# Patient Record
Sex: Female | Born: 1950
Health system: Southern US, Community
[De-identification: ages and names within clinical notes are randomized; demographics above are authoritative.]

## PROBLEM LIST (undated history)

## (undated) ENCOUNTER — Emergency Department (HOSPITAL_COMMUNITY): Admission: EM | Payer: Medicaid Other | Source: Home / Self Care

## (undated) DIAGNOSIS — M1990 Unspecified osteoarthritis, unspecified site: Secondary | ICD-10-CM

## (undated) DIAGNOSIS — R413 Other amnesia: Secondary | ICD-10-CM

## (undated) DIAGNOSIS — K851 Biliary acute pancreatitis without necrosis or infection: Secondary | ICD-10-CM

## (undated) DIAGNOSIS — K8020 Calculus of gallbladder without cholecystitis without obstruction: Secondary | ICD-10-CM

## (undated) DIAGNOSIS — I10 Essential (primary) hypertension: Secondary | ICD-10-CM

## (undated) DIAGNOSIS — K219 Gastro-esophageal reflux disease without esophagitis: Secondary | ICD-10-CM

## (undated) DIAGNOSIS — G8929 Other chronic pain: Secondary | ICD-10-CM

## (undated) DIAGNOSIS — M25569 Pain in unspecified knee: Secondary | ICD-10-CM

## (undated) HISTORY — DX: Other amnesia: R41.3

## (undated) HISTORY — PX: EXTERNAL EAR SURGERY: SHX627

---

## 2010-06-30 ENCOUNTER — Inpatient Hospital Stay (INDEPENDENT_AMBULATORY_CARE_PROVIDER_SITE_OTHER)
Admission: RE | Admit: 2010-06-30 | Discharge: 2010-06-30 | Disposition: A | Discharge status: Home / Self Care | Payer: Medicaid Other | Source: Ambulatory Visit | Attending: Emergency Medicine | Admitting: Emergency Medicine

## 2010-06-30 DIAGNOSIS — I10 Essential (primary) hypertension: Secondary | ICD-10-CM

## 2010-11-20 ENCOUNTER — Inpatient Hospital Stay (INDEPENDENT_AMBULATORY_CARE_PROVIDER_SITE_OTHER)
Admission: RE | Admit: 2010-11-20 | Discharge: 2010-11-20 | Disposition: A | Discharge status: Home / Self Care | Payer: Medicaid Other | Source: Ambulatory Visit | Attending: Family Medicine | Admitting: Family Medicine

## 2010-11-20 DIAGNOSIS — I10 Essential (primary) hypertension: Secondary | ICD-10-CM

## 2010-11-20 DIAGNOSIS — R221 Localized swelling, mass and lump, neck: Secondary | ICD-10-CM

## 2011-07-19 ENCOUNTER — Emergency Department (HOSPITAL_COMMUNITY): Payer: Self-pay

## 2011-07-19 ENCOUNTER — Encounter (HOSPITAL_COMMUNITY): Payer: Self-pay | Admitting: Emergency Medicine

## 2011-07-19 ENCOUNTER — Emergency Department (HOSPITAL_COMMUNITY)
Admission: EM | Admit: 2011-07-19 | Discharge: 2011-07-19 | Disposition: A | Discharge status: Home / Self Care | Payer: Self-pay | Attending: Emergency Medicine | Admitting: Emergency Medicine

## 2011-07-19 DIAGNOSIS — S82402A Unspecified fracture of shaft of left fibula, initial encounter for closed fracture: Secondary | ICD-10-CM

## 2011-07-19 DIAGNOSIS — S8263XA Displaced fracture of lateral malleolus of unspecified fibula, initial encounter for closed fracture: Secondary | ICD-10-CM | POA: Insufficient documentation

## 2011-07-19 DIAGNOSIS — W19XXXA Unspecified fall, initial encounter: Secondary | ICD-10-CM | POA: Insufficient documentation

## 2011-07-19 DIAGNOSIS — Y92009 Unspecified place in unspecified non-institutional (private) residence as the place of occurrence of the external cause: Secondary | ICD-10-CM | POA: Insufficient documentation

## 2011-07-19 DIAGNOSIS — I10 Essential (primary) hypertension: Secondary | ICD-10-CM | POA: Insufficient documentation

## 2011-07-19 HISTORY — DX: Essential (primary) hypertension: I10

## 2011-07-19 MED ORDER — HYDROCODONE-ACETAMINOPHEN 5-325 MG PO TABS
1.0000 | ORAL_TABLET | Freq: Once | ORAL | Status: AC
  Administered 2011-07-19: 1 via ORAL
  Filled 2011-07-19: qty 1

## 2011-07-19 MED ORDER — HYDROCODONE-ACETAMINOPHEN 5-325 MG PO TABS: 1.0000 | ORAL_TABLET | ORAL | Status: AC | PRN

## 2011-07-19 NOTE — Progress Notes (Signed)
Orthopedic Tech Progress Note Patient Details:  Michelle Boyd 04/26/1950 [id number]  Ortho Devices Type of Ortho Device: Crutches;Post (short) splint;Stirrup splint Splint Material: Fiberglass Ortho Device/Splint Location: left leg Ortho Device/Splint Interventions: Application   Dorinda Stehr 07/19/2011, 11:17 PM

## 2011-07-19 NOTE — Discharge Instructions (Signed)
Your xrays show that you have a break to your fibula, the bone on the outside of your lower leg at the ankle. You have been placed in a splint for this. You will need to follow up with an orthopedic doctor to have further treatment and to get a cast. Please call the phone number listed above to make an appointment for next week. You have also been given a prescription for pain medication. Take as needed. If you have changes in the color of your skin, increased pain or swelling, numbness, or any other worrisome symptoms, return to the emergency department.  ???? xrays ?? ???? ???????????, ???? ??? ???? ?? ??? ?? ???? ?? ????? ?? ??? ?? ???? ???? ?? ?? ????. ?? ?? ?? ??? ?? ????? ??? ??? ??? ??. ?? ??? ?? ???? ?? ??? ?? ?? ?? ???? ???? ?? ??? ?? ?????????? ?????? ?? ??? ???? ???? ?? ???????? ????. ???? ?????? ?? ??? ?? ???????? ???? ?? ??? ??? ???????? ???? ???? ?? ??? ????. ?? ?? ???? ??? ?? ??? ?? ?????? ???? ??? ??. ????? ?? ??? ??? ?? ??. ?? ???? ????? ?? ??? ??? ???????? ???? ??, ???? ?? ????, ?????? ?? ????, ?? ???? ?? ???? ???????? ????? ?????? ??? ??, ????????? ????? ??? ?????.

## 2011-07-19 NOTE — ED Notes (Signed)
PT. REPORTS LEFT ANKLE PAIN / LEFT KNEE PAIN AND RIGHT LEG PAIN , SLIPPED AND FELL TODAY , NO LOC , AMBULATORY , DAUGHTER- IN - LAW TRANSLATING FOR PT. ( NEPALESE 0.

## 2011-07-19 NOTE — ED Provider Notes (Signed)
History     CSN: [id number]  Arrival date & time 07/19/11  2109   First MD Initiated Contact with Patient 07/19/11 2224      Chief Complaint  Patient presents with  . Ankle Pain    (Consider location/radiation/quality/duration/timing/severity/associated sxs/prior treatment) Patient is a 61 y.o. female presenting with ankle pain. The history is provided by the patient and a relative. Language Interpreter Used: daughter in law serving as interpreter.  Ankle Pain  The incident occurred 6 to 12 hours ago. The incident occurred at home. The injury mechanism was a fall. The pain is present in the left ankle and left knee. The quality of the pain is described as aching and throbbing. The pain is moderate. The pain has been constant since onset. Associated symptoms include inability to bear weight. Pertinent negatives include no numbness, no loss of motion, no muscle weakness, no loss of sensation and no tingling. She reports no foreign bodies present. The symptoms are aggravated by bearing weight and palpation. She has tried rest for the symptoms. The treatment provided no relief.   Pt twisted her ankle in a hole this afternoon. Had immediate pain to the L lat ankle after this. Unable to wt bear. Has noted swelling to the area. Slight pain to the L knee as well.   Past Medical History  Diagnosis Date  . Hypertension     History reviewed. No pertinent past surgical history.  No family history on file.  History  Substance Use Topics  . Smoking status: Never Smoker   . Smokeless tobacco: Not on file  . Alcohol Use: No    OB History    Grav Para Term Preterm Abortions TAB SAB Ect Mult Living                  Review of Systems  Constitutional: Negative.   Musculoskeletal: Positive for joint swelling and arthralgias.  Skin: Negative for wound.  Neurological: Negative for tingling and numbness.    Allergies  Review of patient's allergies indicates no known allergies.  Home  Medications   Current Outpatient Rx  Name Route Sig Dispense Refill  . CHLORTHALIDONE 25 MG PO TABS Oral Take 25 mg by mouth daily.    Marland Kitchen HYDRALAZINE HCL 25 MG PO TABS Oral Take 25 mg by mouth daily.    Marland Kitchen LISINOPRIL 10 MG PO TABS Oral Take 10 mg by mouth daily.    Marland Kitchen NAPROXEN 500 MG PO TABS Oral Take 500 mg by mouth 2 (two) times daily with a meal.      BP 148/67  Pulse 81  Temp(Src) 98.3 F (36.8 C) (Oral)  Resp 14  SpO2 99%  Physical Exam  Nursing note and vitals reviewed. Constitutional: She is oriented to person, place, and time. She appears well-developed and well-nourished. No distress.  HENT:  Head: Normocephalic and atraumatic.  Right Ear: External ear normal.  Left Ear: External ear normal.  Musculoskeletal:       Left knee: Normal.       Left ankle: She exhibits decreased range of motion, swelling and ecchymosis. She exhibits no deformity. tenderness. Lateral malleolus tenderness found. No medial malleolus and no head of 5th metatarsal tenderness found. Achilles tendon exhibits normal Thompson's test results.       ROM limited secondary to pain. Tender over lat malleolus with mild ecchymoses to same. L foot neurovascularly intact with sensory intact to light touch. Good DP and PT pulses. Cap refill <3.  Neurological: She is  alert and oriented to person, place, and time.  Skin: Skin is warm and dry. No rash noted. She is not diaphoretic.  Psychiatric: She has a normal mood and affect.    ED Course  Procedures (including critical care time)  Labs Reviewed - No data to display Dg Ankle Complete Left  07/19/2011  *RADIOLOGY REPORT*  Clinical Data: Larey Seat in hole, pain  LEFT ANKLE COMPLETE - 3+ VIEW  Comparison: None.  Findings: Transverse fracture lateral malleolus with slight inferior displacement.  Marked soft tissue swelling.  Ankle mortise intact.  IMPRESSION: Transverse lateral malleolar fracture.  Soft tissue swelling.  Original Report Authenticated By: Elsie Stain,  M.D.   Dg Knee Complete 4 Views Left  07/19/2011  *RADIOLOGY REPORT*  Clinical Data: Larey Seat in hole, pain  LEFT KNEE - COMPLETE 4+ VIEW  Comparison:  None.  Findings:  There is no evidence of fracture, dislocation, or joint effusion.  There is no evidence of arthropathy or other focal bone abnormality.  Soft tissues are unremarkable.  IMPRESSION: Negative.  Original Report Authenticated By: Elsie Stain, M.D.     1. Fibula fracture, left, closed, initial encounter       MDM  Pt with transverse lat malleolus fx by xray (I personally reviewed the films). She has point tenderness over this area. Will place in post splint, on crutches. Instructed to make f/u with ortho next week for further eval and tx. Reasons to return to ED discussed. Family verbalized understanding and agreeable with plan.       Grant Fontana, [place] 07/20/11 [phone]

## 2011-07-23 NOTE — ED Provider Notes (Signed)
Medical screening examination/treatment/procedure(s) were performed by non-physician practitioner and as supervising physician I was immediately available for consultation/collaboration.  Belen Pesch L Zaviyar Rahal, MD 07/23/11 1519 

## 2012-04-04 ENCOUNTER — Encounter (HOSPITAL_COMMUNITY): Payer: Self-pay

## 2012-04-04 ENCOUNTER — Emergency Department (INDEPENDENT_AMBULATORY_CARE_PROVIDER_SITE_OTHER)
Admission: EM | Admit: 2012-04-04 | Discharge: 2012-04-04 | Disposition: A | Discharge status: Home / Self Care | Payer: Self-pay | Source: Home / Self Care

## 2012-04-04 DIAGNOSIS — I10 Essential (primary) hypertension: Secondary | ICD-10-CM

## 2012-04-04 LAB — COMPREHENSIVE METABOLIC PANEL
ALT: 15 U/L (ref 0–35)
Albumin: 3.8 g/dL (ref 3.5–5.2)
Alkaline Phosphatase: 115 U/L (ref 39–117)
BUN: 8 mg/dL (ref 6–23)
Chloride: 104 mEq/L (ref 96–112)
GFR calc Af Amer: >90 mL/min (ref >90)
Glucose, Bld: 113 mg/dL — ABNORMAL HIGH (ref 70–99)
Potassium: 3 mEq/L — ABNORMAL LOW (ref 3.5–5.1)
Sodium: 140 mEq/L (ref 135–145)
Total Bilirubin: 0.5 mg/dL (ref 0.3–1.2)
Total Protein: 8.1 g/dL (ref 6.0–8.3)

## 2012-04-04 LAB — LIPID PANEL
Cholesterol: 190 mg/dL (ref 0–200)
LDL Cholesterol: 105 mg/dL — ABNORMAL HIGH (ref 0–99)
Total CHOL/HDL Ratio: 3.8 RATIO
Triglycerides: 176 mg/dL — ABNORMAL HIGH (ref <150)
VLDL: 35 mg/dL (ref 0–40)

## 2012-04-04 LAB — CBC
HCT: 36.4 % (ref 36.0–46.0)
Hemoglobin: 11.5 g/dL — ABNORMAL LOW (ref 12.0–15.0)
RBC: 5.21 MIL/uL — ABNORMAL HIGH (ref 3.87–5.11)
WBC: 6.2 10*3/uL (ref 4.0–10.5)

## 2012-04-04 MED ORDER — ASPIRIN 81 MG PO TABS: 81.0000 mg | ORAL_TABLET | Freq: Every day | ORAL | Status: DC

## 2012-04-04 MED ORDER — LISINOPRIL-HYDROCHLOROTHIAZIDE 20-25 MG PO TABS: 1.0000 | ORAL_TABLET | Freq: Every day | ORAL | Status: DC

## 2012-04-04 NOTE — ED Notes (Signed)
Complain of cough congestion and a lot of sinus pressure past 3 days

## 2012-04-04 NOTE — ED Provider Notes (Signed)
History     CSN: [id number]  Arrival date & time 04/04/12  1600   First MD Initiated Contact with Patient 04/04/12 1614      Chief Complaint  Patient presents with  . URI    (Consider location/radiation/quality/duration/timing/severity/associated sxs/prior treatment) HPI  Patient is here today for regular check up. She has never had any medical care in the past. Her BP today is very high.  She needs to get mammogram, Colonoscopy, pap smear and vaccinations.     Past Medical History  Diagnosis Date  . Hypertension     History reviewed. No pertinent past surgical history.  No family history on file.  History  Substance Use Topics  . Smoking status: Never Smoker   . Smokeless tobacco: Not on file  . Alcohol Use: No    OB History   Grav Para Term Preterm Abortions TAB SAB Ect Mult Living                  Review of Systems  Allergies  Review of patient's allergies indicates no known allergies.  Home Medications   Current Outpatient Rx  Name  Route  Sig  Dispense  Refill  . chlorthalidone (HYGROTON) 25 MG tablet   Oral   Take 25 mg by mouth daily.         . hydrALAZINE (APRESOLINE) 25 MG tablet   Oral   Take 25 mg by mouth daily.         Marland Kitchen lisinopril (PRINIVIL,ZESTRIL) 10 MG tablet   Oral   Take 10 mg by mouth daily.         . naproxen (NAPROSYN) 500 MG tablet   Oral   Take 500 mg by mouth 2 (two) times daily with a meal.           BP 214/97  Pulse 97  Temp(Src) 97.7 F (36.5 C) (Oral)  SpO2 100%  Physical Exam Physical Exam: General: Vital signs reviewed and noted. Well-developed, well-nourished, in no acute distress; alert, appropriate and cooperative throughout examination.  Head: Normocephalic, atraumatic.  Eyes: PERRL, EOMI, No signs of anemia or jaundince.  Nose: Mucous membranes moist, not inflammed, nonerythematous.  Throat: Oropharynx nonerythematous, no exudate appreciated.   Neck: No deformities, masses, or tenderness  noted.Supple, No carotid Bruits, no JVD.  Lungs:  Normal respiratory effort. Clear to auscultation BL without crackles or wheezes.  Heart: RRR. S1 and S2 normal without gallop, murmur, or rubs.  Abdomen:  BS normoactive. Soft, Nondistended, non-tender.  No masses or organomegaly.  Extremities: No pretibial edema.  Neurologic: A&O X3, CN II - XII are grossly intact. Motor strength is 5/5 in the all 4 extremities, Sensations intact to light touch, Cerebellar signs negative.  Skin: No visible rashes, scars.     ED Course  Procedures (including critical care time)  Labs Reviewed - No data to display No results found.   No diagnosis found.    MDM  1. Start her on HCTZ-lisinopril combination pill today.  Follow up in 10 days. 2. Check lipid panel, TSH, HBa1c, CMP and CBC today. 3. Repeat BMP in 10 days to review kidney function and K.        Lars Mage, MD 04/04/12 1626

## 2012-04-05 LAB — TSH: TSH: 1.085 u[IU]/mL (ref 0.350–4.500)

## 2012-04-14 ENCOUNTER — Telehealth (HOSPITAL_COMMUNITY): Payer: Self-pay

## 2012-05-13 ENCOUNTER — Emergency Department (HOSPITAL_COMMUNITY)
Admission: EM | Admit: 2012-05-13 | Discharge: 2012-05-13 | Disposition: A | Discharge status: Home / Self Care | Payer: No Typology Code available for payment source | Source: Home / Self Care | Attending: Family Medicine | Admitting: Family Medicine

## 2012-05-13 ENCOUNTER — Encounter (HOSPITAL_COMMUNITY): Payer: Self-pay | Admitting: Emergency Medicine

## 2012-05-13 DIAGNOSIS — J069 Acute upper respiratory infection, unspecified: Secondary | ICD-10-CM

## 2012-05-13 DIAGNOSIS — I10 Essential (primary) hypertension: Secondary | ICD-10-CM

## 2012-05-13 MED ORDER — DEXTROMETHORPHAN POLISTIREX 30 MG/5ML PO LQCR: 60.0000 mg | Freq: Two times a day (BID) | ORAL | Status: DC

## 2012-05-13 MED ORDER — LISINOPRIL-HYDROCHLOROTHIAZIDE 20-25 MG PO TABS: 1.0000 | ORAL_TABLET | Freq: Every day | ORAL | Status: DC

## 2012-05-13 NOTE — ED Provider Notes (Signed)
History     CSN: [id number]  Arrival date & time 05/13/12  4098   First MD Initiated Contact with Patient 05/13/12 1821      Chief Complaint  Patient presents with  . Medication Refill    one week with out meds.    (Consider location/radiation/quality/duration/timing/severity/associated sxs/prior treatment) Patient is a 62 y.o. female presenting with hypertension. The history is provided by the patient.  Hypertension This is a chronic problem. The current episode started more than 1 week ago (out of med for 1 week , here for refill.). The problem has not changed since onset.Pertinent negatives include no chest pain.    Past Medical History  Diagnosis Date  . Hypertension     History reviewed. No pertinent past surgical history.  History reviewed. No pertinent family history.  History  Substance Use Topics  . Smoking status: Never Smoker   . Smokeless tobacco: Not on file  . Alcohol Use: No    OB History   Grav Para Term Preterm Abortions TAB SAB Ect Mult Living                  Review of Systems  Constitutional: Negative.   Respiratory: Negative.   Cardiovascular: Negative for chest pain.    Allergies  Review of patient's allergies indicates no known allergies.  Home Medications   Current Outpatient Rx  Name  Route  Sig  Dispense  Refill  . aspirin 81 MG tablet   Oral   Take 1 tablet (81 mg total) by mouth daily.   30 tablet      . dextromethorphan (DELSYM) 30 MG/5ML liquid   Oral   Take 10 mLs (60 mg total) by mouth 2 (two) times daily. Prn cough   89 mL   0   . lisinopril-hydrochlorothiazide (PRINZIDE,ZESTORETIC) 20-25 MG per tablet   Oral   Take 1 tablet by mouth daily.   30 tablet   0   . lisinopril-hydrochlorothiazide (PRINZIDE,ZESTORETIC) 20-25 MG per tablet   Oral   Take 1 tablet by mouth daily.   30 tablet   1     BP 178/95  Pulse 74  Temp(Src) 99.1 F (37.3 C) (Oral)  Resp 20  SpO2 98%  Physical Exam  Nursing note and  vitals reviewed. Constitutional: She is oriented to person, place, and time. She appears well-developed and well-nourished.  HENT:  Head: Normocephalic.  Mouth/Throat: Oropharynx is clear and moist.  Eyes: Conjunctivae are normal. Pupils are equal, round, and reactive to light.  Neck: Normal range of motion. Neck supple.  Cardiovascular: Regular rhythm and normal heart sounds.   Pulmonary/Chest: Effort normal and breath sounds normal.  Musculoskeletal: She exhibits no edema.  Lymphadenopathy:    She has no cervical adenopathy.  Neurological: She is alert and oriented to person, place, and time.  Skin: Skin is warm and dry.    ED Course  Procedures (including critical care time)  Labs Reviewed - No data to display No results found.   1. Hypertension, benign essential, goal below 140/90   2. URI (upper respiratory infection)       MDM          Linna Hoff, MD 05/13/12 [phone]

## 2012-05-13 NOTE — ED Notes (Signed)
Reports: needs refill on BP medication has been with out for 1 wk  And having a nonproductive cough with some congestion. Denies fever and any other symptoms.

## 2012-05-26 ENCOUNTER — Encounter (HOSPITAL_COMMUNITY): Payer: Self-pay

## 2012-05-26 ENCOUNTER — Emergency Department (HOSPITAL_COMMUNITY)
Admission: EM | Admit: 2012-05-26 | Discharge: 2012-05-26 | Disposition: A | Discharge status: Home / Self Care | Payer: No Typology Code available for payment source | Source: Home / Self Care | Attending: Family Medicine | Admitting: Family Medicine

## 2012-05-26 DIAGNOSIS — E876 Hypokalemia: Secondary | ICD-10-CM | POA: Diagnosis present

## 2012-05-26 DIAGNOSIS — I10 Essential (primary) hypertension: Secondary | ICD-10-CM

## 2012-05-26 LAB — POCT I-STAT, CHEM 8
Calcium, Ion: 1.2 mmol/L (ref 1.13–1.30)
Chloride: 93 mEq/L — ABNORMAL LOW (ref 96–112)
Glucose, Bld: 132 mg/dL — ABNORMAL HIGH (ref 70–99)
HCT: 37 % (ref 36.0–46.0)
Hemoglobin: 12.6 g/dL (ref 12.0–15.0)

## 2012-05-26 MED ORDER — POTASSIUM CHLORIDE ER 10 MEQ PO TBCR: 10.0000 meq | EXTENDED_RELEASE_TABLET | Freq: Two times a day (BID) | ORAL | Status: DC

## 2012-05-26 MED ORDER — LISINOPRIL-HYDROCHLOROTHIAZIDE 20-12.5 MG PO TABS: 1.0000 | ORAL_TABLET | Freq: Every day | ORAL | Status: DC

## 2012-05-26 NOTE — ED Provider Notes (Signed)
History     CSN: [id number]  Arrival date & time 05/26/12  1800   First MD Initiated Contact with Patient 05/26/12 1800      Chief Complaint  Patient presents with  . Hypertension    HPI Pt says she is tolerating meds well. NO complaints except occasional cough and cramping in hands.  No CP, No SOB.    Past Medical History  Diagnosis Date  . Hypertension     History reviewed. No pertinent past surgical history.  No family history on file.  History  Substance Use Topics  . Smoking status: Never Smoker   . Smokeless tobacco: Not on file  . Alcohol Use: No    OB History   Grav Para Term Preterm Abortions TAB SAB Ect Mult Living                  Review of Systems Constitutional: Negative.  HENT: Negative.  Respiratory: Negative.  Cardiovascular: Negative.  Gastrointestinal: Negative.  Endocrine: Negative.  Genitourinary: Negative.  Musculoskeletal: Negative.  Skin: Negative.  Allergic/Immunologic: Negative.  Neurological: Negative.  Hematological: Negative.  Psychiatric/Behavioral: Negative.  All other systems reviewed and are negative   Allergies  Review of patient's allergies indicates no known allergies.  Home Medications   Current Outpatient Rx  Name  Route  Sig  Dispense  Refill  . aspirin 81 MG tablet   Oral   Take 1 tablet (81 mg total) by mouth daily.   30 tablet      . dextromethorphan (DELSYM) 30 MG/5ML liquid   Oral   Take 10 mLs (60 mg total) by mouth 2 (two) times daily. Prn cough   89 mL   0   . lisinopril-hydrochlorothiazide (PRINZIDE,ZESTORETIC) 20-25 MG per tablet   Oral   Take 1 tablet by mouth daily.   30 tablet   0   . lisinopril-hydrochlorothiazide (PRINZIDE,ZESTORETIC) 20-25 MG per tablet   Oral   Take 1 tablet by mouth daily.   30 tablet   1     BP 121/74  Pulse 83  Temp(Src) 98.1 F (36.7 C) (Oral)  Resp 16  SpO2 98%  Physical Exam Nursing note and vitals reviewed.  Constitutional: She is oriented to  person, place, and time. She appears well-developed and well-nourished. No distress.  HENT:  Head: Normocephalic and atraumatic.  Eyes: Conjunctivae and EOM are normal. Pupils are equal, round, and reactive to light.  Neck: Normal range of motion. Neck supple. No JVD present. No tracheal deviation present. No thyromegaly present.  Cardiovascular: Normal rate, regular rhythm and normal heart sounds.  Pulmonary/Chest: Effort normal and breath sounds normal. No respiratory distress. She has no wheezes.  Abdominal: Soft. Bowel sounds are normal.  Musculoskeletal: arthritic changes noted.  Normal range of motion. She exhibits no edema and no tenderness.  Lymphadenopathy:  She has no cervical adenopathy.  Neurological: She is alert and oriented to person, place, and time. She has normal reflexes.  Skin: Skin is warm and dry.  Psychiatric: She has a normal mood and affect. Her behavior is normal. Judgment and thought content normal.   ED Course  Procedures (including critical care time)  Labs Reviewed - No data to display No results found.   No diagnosis found.  MDM  IMPRESSION  Hypertension, controlled   Hypokalemia   Hyperglycemia / prediabetes   RECOMMENDATIONS / PLAN Discussed prediabetes today and advised low carb and low concentrated sweets, blood pressure is well controlled, Check istat today Low potassium noted  KCl 10 meq po bid Change zestoretic to 20/12.5 po daily   FOLLOW UP 3 months   The patient was given clear instructions to go to ER or return to medical center if symptoms don't improve, worsen or new problems develop.  The patient verbalized understanding.  The patient was told to call to get lab results if they haven't heard anything in the next week.            Cleora Fleet, MD 05/26/12 1905

## 2012-05-26 NOTE — ED Notes (Signed)
Patient has a history of HTN Needs medication refill

## 2012-07-17 ENCOUNTER — Ambulatory Visit: Payer: No Typology Code available for payment source | Attending: Family Medicine | Admitting: Internal Medicine

## 2012-07-17 VITALS — BP 121/74 | HR 80 | Temp 98.0°F | Resp 15 | Wt 135.4 lb

## 2012-07-17 DIAGNOSIS — Z760 Encounter for issue of repeat prescription: Secondary | ICD-10-CM | POA: Insufficient documentation

## 2012-07-17 DIAGNOSIS — Z87891 Personal history of nicotine dependence: Secondary | ICD-10-CM | POA: Insufficient documentation

## 2012-07-17 DIAGNOSIS — I10 Essential (primary) hypertension: Secondary | ICD-10-CM

## 2012-07-17 DIAGNOSIS — Z79899 Other long term (current) drug therapy: Secondary | ICD-10-CM | POA: Insufficient documentation

## 2012-07-17 DIAGNOSIS — Z7982 Long term (current) use of aspirin: Secondary | ICD-10-CM | POA: Insufficient documentation

## 2012-07-17 DIAGNOSIS — R059 Cough, unspecified: Secondary | ICD-10-CM | POA: Insufficient documentation

## 2012-07-17 DIAGNOSIS — R05 Cough: Secondary | ICD-10-CM

## 2012-07-17 DIAGNOSIS — E876 Hypokalemia: Secondary | ICD-10-CM | POA: Insufficient documentation

## 2012-07-17 MED ORDER — FAMOTIDINE 20 MG PO TABS: 20.0000 mg | ORAL_TABLET | Freq: Two times a day (BID) | ORAL | Status: DC

## 2012-07-17 MED ORDER — LISINOPRIL-HYDROCHLOROTHIAZIDE 20-12.5 MG PO TABS: 1.0000 | ORAL_TABLET | Freq: Every day | ORAL | Status: DC

## 2012-07-17 MED ORDER — LORATADINE 10 MG PO TABS: 10.0000 mg | ORAL_TABLET | Freq: Every day | ORAL | Status: DC

## 2012-07-17 NOTE — Progress Notes (Signed)
Patient ID: Michelle Boyd, female   DOB: [birth year], 62 y.o.   MRN: [id number] Patient Demographics  Michelle Boyd, is a 62 y.o. female  WUJ:[id number]  [id number]  DOB - [birth year]  Chief Complaint  Patient presents with  . Medication Refill        Subjective:   [name] today is here for a follow up visit. She needs a refill on her antihypertensive medications, her only complaint currently is a dry cough that she's been having for the past 1-2 months. She also claims that she has occasional runny nose and also itchy sensation in her throat. She does not have fever, cough is nonproductive, there is no associated shortness of breath. She is a former smoker and quit more than 20 years ago. She has a 10 year pack history of smoking he did  Patient has No headache, No chest pain, No abdominal pain - No Nausea, No new weakness tingling or numbness, No Cough - SOB.  Objective:    Filed Vitals:   07/17/12 1739  BP: 121/74  Pulse: 80  Temp: 98 F (36.7 C)  Resp: 15  Weight: 135 lb 6.4 oz (61.417 kg)  SpO2: 99%     ALLERGIES:  No Known Allergies  PAST MEDICAL HISTORY: Past Medical History  Diagnosis Date  . Hypertension     MEDICATIONS AT HOME: Prior to Admission medications   Medication Sig Start Date End Date Taking? Authorizing Provider  aspirin 81 MG tablet Take 1 tablet (81 mg total) by mouth daily. 04/04/12   Lars Mage, MD  famotidine (PEPCID) 20 MG tablet Take 1 tablet (20 mg total) by mouth 2 (two) times daily. 07/17/12   Tyrianna Lightle Levora Dredge, MD  lisinopril-hydrochlorothiazide (ZESTORETIC) 20-12.5 MG per tablet Take 1 tablet by mouth daily. 07/17/12   Lovelle Deitrick Levora Dredge, MD  loratadine (CLARITIN) 10 MG tablet Take 1 tablet (10 mg total) by mouth daily. 07/17/12   Chaye Misch Levora Dredge, MD  potassium chloride (K-DUR) 10 MEQ tablet Take 1 tablet (10 mEq total) by mouth 2 (two) times daily. 05/26/12   [place] Cyndie Mull, MD     Exam  General appearance :Awake, alert, not in any  distress. Speech Clear. Not toxic Looking HEENT: Atraumatic and Normocephalic, pupils equally reactive to light and accomodation Neck: supple, no JVD. No cervical lymphadenopathy.  Chest:Good air entry bilaterally, no added sounds  CVS: S1 S2 regular, no murmurs.  Abdomen: Bowel sounds present, Non tender and not distended with no gaurding, rigidity or rebound. Extremities: B/L Lower Ext shows no edema, both legs are warm to touch Neurology: Awake alert, and oriented X 3, CN II-XII intact, Non focal Skin:No Rash Wounds:N/A    Data Review   CBC No results found for this basename: WBC, HGB, HCT, PLT, MCV, MCH, MCHC, RDW, NEUTRABS, LYMPHSABS, MONOABS, EOSABS, BASOSABS, BANDABS, BANDSABD,  in the last 168 hours  Chemistries   No results found for this basename: NA, K, CL, CO2, GLUCOSE, BUN, CREATININE, GFRCGP, CALCIUM, MG, AST, ALT, ALKPHOS, BILITOT,  in the last 168 hours ------------------------------------------------------------------------------------------------------------------ No results found for this basename: HGBA1C,  in the last 72 hours ------------------------------------------------------------------------------------------------------------------ No results found for this basename: CHOL, HDL, LDLCALC, TRIG, CHOLHDL, LDLDIRECT,  in the last 72 hours ------------------------------------------------------------------------------------------------------------------ No results found for this basename: TSH, T4TOTAL, FREET3, T3FREE, THYROIDAB,  in the last 72 hours ------------------------------------------------------------------------------------------------------------------ No results found for this basename: VITAMINB12, FOLATE, FERRITIN, TIBC, IRON, RETICCTPCT,  in the last 72 hours  Coagulation profile  No results found  for this basename: INR, PROTIME,  in the last 168 hours    Assessment & Plan   Hypertension - Controlled, continue with lisinopril and  HCTZ  Hypokalemia - Check BMET at next - Continue with  KCl supplementation  Cough -? Secondary to seasonal allergies - Trial of Pepcid and Claritin for a month - Check x-ray- prior to next visit) from [place]-family claims PPD negative during immigration process 2011)  Follow up in one month-please followup on chemistries and chest x-ray on next visit

## 2012-07-17 NOTE — Progress Notes (Signed)
Patient needs refill on her blood pressure medication

## 2012-08-18 ENCOUNTER — Ambulatory Visit: Payer: No Typology Code available for payment source

## 2012-09-22 ENCOUNTER — Emergency Department (HOSPITAL_COMMUNITY)
Admission: EM | Admit: 2012-09-22 | Discharge: 2012-09-23 | Disposition: A | Discharge status: Home / Self Care | Payer: No Typology Code available for payment source | Attending: Emergency Medicine | Admitting: Emergency Medicine

## 2012-09-22 ENCOUNTER — Emergency Department (HOSPITAL_COMMUNITY): Payer: No Typology Code available for payment source

## 2012-09-22 ENCOUNTER — Encounter (HOSPITAL_COMMUNITY): Payer: Self-pay | Admitting: *Deleted

## 2012-09-22 DIAGNOSIS — R5381 Other malaise: Secondary | ICD-10-CM | POA: Insufficient documentation

## 2012-09-22 DIAGNOSIS — M542 Cervicalgia: Secondary | ICD-10-CM | POA: Insufficient documentation

## 2012-09-22 DIAGNOSIS — J020 Streptococcal pharyngitis: Secondary | ICD-10-CM | POA: Insufficient documentation

## 2012-09-22 DIAGNOSIS — IMO0001 Reserved for inherently not codable concepts without codable children: Secondary | ICD-10-CM | POA: Insufficient documentation

## 2012-09-22 DIAGNOSIS — R059 Cough, unspecified: Secondary | ICD-10-CM | POA: Insufficient documentation

## 2012-09-22 DIAGNOSIS — R000 Tachycardia, unspecified: Secondary | ICD-10-CM | POA: Insufficient documentation

## 2012-09-22 DIAGNOSIS — I10 Essential (primary) hypertension: Secondary | ICD-10-CM | POA: Insufficient documentation

## 2012-09-22 DIAGNOSIS — R509 Fever, unspecified: Secondary | ICD-10-CM | POA: Insufficient documentation

## 2012-09-22 DIAGNOSIS — R599 Enlarged lymph nodes, unspecified: Secondary | ICD-10-CM | POA: Insufficient documentation

## 2012-09-22 DIAGNOSIS — R05 Cough: Secondary | ICD-10-CM | POA: Insufficient documentation

## 2012-09-22 LAB — URINALYSIS, ROUTINE W REFLEX MICROSCOPIC
Nitrite: NEGATIVE
Specific Gravity, Urine: 1.005 (ref 1.005–1.030)
pH: 7 (ref 5.0–8.0)

## 2012-09-22 LAB — COMPREHENSIVE METABOLIC PANEL
ALT: 16 U/L (ref 0–35)
AST: 24 U/L (ref 0–37)
Albumin: 3.5 g/dL (ref 3.5–5.2)
Alkaline Phosphatase: 88 U/L (ref 39–117)
BUN: 9 mg/dL (ref 6–23)
Chloride: 103 mEq/L (ref 96–112)
Potassium: 3.4 mEq/L — ABNORMAL LOW (ref 3.5–5.1)
Sodium: 135 mEq/L (ref 135–145)
Total Bilirubin: 0.9 mg/dL (ref 0.3–1.2)
Total Protein: 7.8 g/dL (ref 6.0–8.3)

## 2012-09-22 LAB — CBC WITH DIFFERENTIAL/PLATELET
Basophils Absolute: 0 10*3/uL (ref 0.0–0.1)
Basophils Relative: 0 % (ref 0–1)
Eosinophils Absolute: 0 10*3/uL (ref 0.0–0.7)
MCH: 24.8 pg — ABNORMAL LOW (ref 26.0–34.0)
MCHC: 32.9 g/dL (ref 30.0–36.0)
Neutro Abs: 15.6 10*3/uL — ABNORMAL HIGH (ref 1.7–7.7)
Neutrophils Relative %: 88 % — ABNORMAL HIGH (ref 43–77)
Platelets: 195 10*3/uL (ref 150–400)
RDW: 14.9 % (ref 11.5–15.5)

## 2012-09-22 LAB — RAPID STREP SCREEN (MED CTR MEBANE ONLY): Streptococcus, Group A Screen (Direct): POSITIVE — AB

## 2012-09-22 LAB — CG4 I-STAT (LACTIC ACID): Lactic Acid, Venous: 1.61 mmol/L (ref 0.5–2.2)

## 2012-09-22 LAB — URINE MICROSCOPIC-ADD ON

## 2012-09-22 MED ORDER — ACETAMINOPHEN 325 MG PO TABS
650.0000 mg | ORAL_TABLET | Freq: Four times a day (QID) | ORAL | Status: DC | PRN
  Administered 2012-09-22: 650 mg via ORAL
  Filled 2012-09-22: qty 2

## 2012-09-22 MED ORDER — SODIUM CHLORIDE 0.9 % IV BOLUS (SEPSIS)
1000.0000 mL | Freq: Once | INTRAVENOUS | Status: AC
  Administered 2012-09-22: 1000 mL via INTRAVENOUS

## 2012-09-22 MED ORDER — IBUPROFEN 600 MG PO TABS: 600.0000 mg | ORAL_TABLET | Freq: Four times a day (QID) | ORAL | Status: DC | PRN

## 2012-09-22 MED ORDER — PENICILLIN V POTASSIUM 500 MG PO TABS: 500.0000 mg | ORAL_TABLET | Freq: Four times a day (QID) | ORAL | Status: AC

## 2012-09-22 NOTE — ED Notes (Signed)
Patient transported to X-ray 

## 2012-09-22 NOTE — ED Notes (Signed)
Pt went to restroom and was aware she needed to give a urine sample and states she missed the cup and was unable to give sample.

## 2012-09-22 NOTE — ED Notes (Signed)
PT c/o cough and sore throat fever and body aches x 2 weeks.  Temp of 103.2 presently.

## 2012-09-23 NOTE — ED Notes (Signed)
Pt comfortable with d/c and f/u instructions. [place] interpreter used for discharge instruction. Prescriptions x2

## 2012-09-24 NOTE — ED Provider Notes (Signed)
CSN: [id number]     Arrival date & time 09/22/12  1833 History     First MD Initiated Contact with Patient 09/22/12 2005     Chief Complaint  Patient presents with  . Sore Throat  . Cough  . Fever   (Consider location/radiation/quality/duration/timing/severity/associated sxs/prior Treatment) HPI Pt does not speak english and daughter is in room to interpret. Pt with fever, chills, fatigue and myalgias for > 1 week. No recent travel, sick contact. +mild non-productive cough. +sore throat. Normal speech and breathing. No lower ext swelling or pain. No abd pain, N/V/D. No urinary symptoms.  Past Medical History  Diagnosis Date  . Hypertension    Past Surgical History  Procedure Laterality Date  . External ear surgery     No family history on file. History  Substance Use Topics  . Smoking status: Never Smoker   . Smokeless tobacco: Not on file  . Alcohol Use: No   OB History   Grav Para Term Preterm Abortions TAB SAB Ect Mult Living                 Review of Systems  Constitutional: Positive for fever, chills and fatigue.  HENT: Positive for sore throat and neck pain. Negative for ear pain, congestion, facial swelling, rhinorrhea, trouble swallowing, neck stiffness, voice change and sinus pressure.   Eyes: Negative for visual disturbance.  Respiratory: Positive for cough. Negative for chest tightness and shortness of breath.   Cardiovascular: Negative for chest pain, palpitations and leg swelling.  Gastrointestinal: Negative for nausea, vomiting, abdominal pain and diarrhea.  Genitourinary: Negative for dysuria, frequency and flank pain.  Musculoskeletal: Positive for myalgias. Negative for back pain and arthralgias.  Skin: Negative for rash and wound.  Neurological: Negative for dizziness, weakness, light-headedness, numbness and headaches.  All other systems reviewed and are negative.    Allergies  Review of patient's allergies indicates no known allergies.  Home  Medications   Current Outpatient Rx  Name  Route  Sig  Dispense  Refill  . aspirin 81 MG tablet   Oral   Take 1 tablet (81 mg total) by mouth daily.   30 tablet      . famotidine (PEPCID) 20 MG tablet   Oral   Take 1 tablet (20 mg total) by mouth 2 (two) times daily.   60 tablet   0   . lisinopril-hydrochlorothiazide (ZESTORETIC) 20-12.5 MG per tablet   Oral   Take 1 tablet by mouth daily.   30 tablet   4   . loratadine (CLARITIN) 10 MG tablet   Oral   Take 1 tablet (10 mg total) by mouth daily.   30 tablet   0   . potassium chloride (K-DUR) 10 MEQ tablet   Oral   Take 1 tablet (10 mEq total) by mouth 2 (two) times daily.   30 tablet   0   . ibuprofen (ADVIL,MOTRIN) 600 MG tablet   Oral   Take 1 tablet (600 mg total) by mouth every 6 (six) hours as needed for pain.   30 tablet   0   . penicillin v potassium (VEETID) 500 MG tablet   Oral   Take 1 tablet (500 mg total) by mouth 4 (four) times daily.   40 tablet   0    BP 163/85  Pulse 90  Temp(Src) 98.8 F (37.1 C) (Oral)  Resp 18  SpO2 98% Physical Exam  Nursing note and vitals reviewed. Constitutional: She is oriented to  person, place, and time. She appears well-developed and well-nourished. No distress.  HENT:  Head: Normocephalic and atraumatic.  Erythematous bl tonsillar enlargement. No def exudates  Eyes: EOM are normal. Pupils are equal, round, and reactive to light.  Neck: Normal range of motion. Neck supple.  No meningismus  Cardiovascular: Regular rhythm.  Exam reveals no gallop and no friction rub.   No murmur heard. tachycardia  Pulmonary/Chest: Effort normal and breath sounds normal. No respiratory distress. She has no wheezes. She has no rales. She exhibits no tenderness.  Abdominal: Soft. Bowel sounds are normal. She exhibits no distension and no mass. There is no tenderness. There is no rebound and no guarding.  Musculoskeletal: Normal range of motion. She exhibits no edema and no  tenderness.  No CVAT  Lymphadenopathy:    She has cervical adenopathy.  Neurological: She is alert and oriented to person, place, and time.  5/5 motor in all ext, sensation intact  Skin: Skin is warm and dry. No rash noted. No erythema. No pallor.  Psychiatric: She has a normal mood and affect. Her behavior is normal.    ED Course   Procedures (including critical care time)  Labs Reviewed  RAPID STREP SCREEN - Abnormal; Notable for the following:    Streptococcus, Group A Screen (Direct) POSITIVE (*)    All other components within normal limits  CBC WITH DIFFERENTIAL - Abnormal; Notable for the following:    WBC 17.7 (*)    Hemoglobin 11.4 (*)    HCT 34.7 (*)    MCV 75.4 (*)    MCH 24.8 (*)    Neutrophils Relative % 88 (*)    Neutro Abs 15.6 (*)    Lymphocytes Relative 6 (*)    Monocytes Absolute 1.1 (*)    All other components within normal limits  COMPREHENSIVE METABOLIC PANEL - Abnormal; Notable for the following:    Potassium 3.4 (*)    Glucose, Bld 119 (*)    GFR calc non Af Amer 70 (*)    GFR calc Af Amer 81 (*)    All other components within normal limits  URINALYSIS, ROUTINE W REFLEX MICROSCOPIC - Abnormal; Notable for the following:    Hgb urine dipstick SMALL (*)    Leukocytes, UA SMALL (*)    All other components within normal limits  URINE MICROSCOPIC-ADD ON  CG4 I-STAT (LACTIC ACID)   Dg Chest 2 View  09/22/2012   *RADIOLOGY REPORT*  Clinical Data: Sore throat, cough and fever  CHEST - 2 VIEW  Comparison: None.  Findings: The heart, mediastinum and hila are unremarkable.  The lungs are clear other than mild bronchitic change in the medial lung bases.  No pleural effusion or pneumothorax.  The bony thorax is demineralized, but intact.  IMPRESSION: No acute cardiopulmonary disease.   Original Report Authenticated By: Amie Portland, M.D.   1. Strep throat     MDM  Pt is well appearing. Normalized HR and temp with tylenol. +strep. Will treat as such with return  precautions given.   Loren Racer, MD 09/24/12 1840

## 2012-11-07 ENCOUNTER — Ambulatory Visit: Payer: No Typology Code available for payment source | Attending: Internal Medicine | Admitting: Internal Medicine

## 2012-11-07 ENCOUNTER — Encounter: Payer: Self-pay | Admitting: Internal Medicine

## 2012-11-07 VITALS — BP 198/79 | HR 85 | Temp 97.7°F | Resp 15 | Wt 134.0 lb

## 2012-11-07 DIAGNOSIS — M25561 Pain in right knee: Secondary | ICD-10-CM | POA: Insufficient documentation

## 2012-11-07 DIAGNOSIS — M25569 Pain in unspecified knee: Secondary | ICD-10-CM | POA: Insufficient documentation

## 2012-11-07 DIAGNOSIS — E876 Hypokalemia: Secondary | ICD-10-CM

## 2012-11-07 DIAGNOSIS — I10 Essential (primary) hypertension: Secondary | ICD-10-CM | POA: Insufficient documentation

## 2012-11-07 MED ORDER — LORATADINE 10 MG PO TABS: 10.0000 mg | ORAL_TABLET | Freq: Every day | ORAL | Status: DC

## 2012-11-07 MED ORDER — FAMOTIDINE 20 MG PO TABS: 20.0000 mg | ORAL_TABLET | Freq: Two times a day (BID) | ORAL | Status: DC

## 2012-11-07 MED ORDER — IBUPROFEN 600 MG PO TABS: 600.0000 mg | ORAL_TABLET | Freq: Four times a day (QID) | ORAL | Status: DC | PRN

## 2012-11-07 MED ORDER — POTASSIUM CHLORIDE ER 10 MEQ PO TBCR: 10.0000 meq | EXTENDED_RELEASE_TABLET | Freq: Two times a day (BID) | ORAL | Status: DC

## 2012-11-07 MED ORDER — LISINOPRIL-HYDROCHLOROTHIAZIDE 20-12.5 MG PO TABS: 1.0000 | ORAL_TABLET | Freq: Every day | ORAL | Status: DC

## 2012-11-07 MED ORDER — NAPROXEN 500 MG PO TABS: 500.0000 mg | ORAL_TABLET | Freq: Two times a day (BID) | ORAL | Status: DC

## 2012-11-07 NOTE — Progress Notes (Signed)
Patient is here for medication refill Has not taken B/P medications since 8/23

## 2012-11-07 NOTE — Progress Notes (Signed)
Patient ID: Michelle Boyd, female   DOB: 26-Oct-1950, 62 y.o.   MRN: [id number] Patient Demographics  Michelle Boyd, is a 62 y.o. female  WUJ:[id number]  [id number]  DOB - Nov 12, 1950  Chief Complaint  Patient presents with  . Medication Refill        Subjective:   Michelle Boyd today is here for a follow up visit. Patient has No headache, No chest pain, No abdominal pain - No Nausea, No new weakness tingling or numbness, No Cough - SOB. - Patient says that she ran out of her blood pressure medications and has not had any medication since 10/11/12. She did not call in to the pharmacy or schedule any appointment either.  Objective:    Filed Vitals:   11/07/12 1247 11/07/12 1249  BP: 210/93 198/79  Pulse: 85   Temp: 97.7 F (36.5 C)   Resp: 15   Weight: 134 lb (60.782 kg)   SpO2: 99%      ALLERGIES:  No Known Allergies  PAST MEDICAL HISTORY: Past Medical History  Diagnosis Date  . Hypertension     MEDICATIONS AT HOME: Prior to Admission medications   Medication Sig Start Date End Date Taking? Authorizing Provider  aspirin 81 MG tablet Take 1 tablet (81 mg total) by mouth daily. 04/04/12   Lars Mage, MD  famotidine (PEPCID) 20 MG tablet Take 1 tablet (20 mg total) by mouth 2 (two) times daily. 11/07/12   Ripudeep Jenna Luo, MD  ibuprofen (ADVIL,MOTRIN) 600 MG tablet Take 1 tablet (600 mg total) by mouth every 6 (six) hours as needed for pain. 11/07/12   Ripudeep Jenna Luo, MD  lisinopril-hydrochlorothiazide (ZESTORETIC) 20-12.5 MG per tablet Take 1 tablet by mouth daily. 11/07/12   Ripudeep Jenna Luo, MD  loratadine (CLARITIN) 10 MG tablet Take 1 tablet (10 mg total) by mouth daily. 11/07/12   Ripudeep Jenna Luo, MD  naproxen (NAPROSYN) 500 MG tablet Take 1 tablet (500 mg total) by mouth 2 (two) times daily with a meal. 11/07/12   Ripudeep Jenna Luo, MD  potassium chloride (K-DUR) 10 MEQ tablet Take 1 tablet (10 mEq total) by mouth 2 (two) times daily. 11/07/12   Ripudeep Jenna Luo, MD     Exam  General  appearance :Awake, alert, NAD, Speech Clear.  HEENT: Atraumatic and Normocephalic, PERLA Neck: supple, no JVD. No cervical lymphadenopathy.  Chest: Clear to auscultation bilaterally, no wheezing, rales or rhonchi CVS: S1 S2 regular, no murmurs.  Abdomen: soft, NBS, NT, ND, no gaurding, rigidity or rebound. Extremities: no cyanosis or clubbing, B/L Lower Ext shows no edema Neurology: Awake alert, and oriented X 3, CN II-XII intact, Non focal Skin: No Rash or lesions Wounds:N/A    Data Review   Basic Metabolic Panel: No results found for this basename: NA, K, CL, CO2, GLUCOSE, BUN, CREATININE, CALCIUM, MG, PHOS,  in the last 168 hours Liver Function Tests: No results found for this basename: AST, ALT, ALKPHOS, BILITOT, PROT, ALBUMIN,  in the last 168 hours  CBC: No results found for this basename: WBC, NEUTROABS, HGB, HCT, MCV, PLT,  in the last 168 hours  ------------------------------------------------------------------------------------------------------------------ No results found for this basename: HGBA1C,  in the last 72 hours ------------------------------------------------------------------------------------------------------------------ No results found for this basename: CHOL, HDL, LDLCALC, TRIG, CHOLHDL, LDLDIRECT,  in the last 72 hours ------------------------------------------------------------------------------------------------------------------ No results found for this basename: TSH, T4TOTAL, FREET3, T3FREE, THYROIDAB,  in the last 72 hours ------------------------------------------------------------------------------------------------------------------ No results found for this basename: VITAMINB12, FOLATE, FERRITIN, TIBC, IRON, RETICCTPCT,  in the last 72 hours  Coagulation profile  No results found for this basename: INR, PROTIME,  in the last 168 hours    Assessment & Plan   Active Problems: Accelerated hypertension - Will give clonidine 0.2 mg  x1 -Restart lisinopril/HCTZ, potassium replacement , Refilled  Bilateral knee pain: Likely due to osteoarthritis - X-rays ordered, started on Naprosyn twice a day with Motrin   Follow-up in2 months     RAI,RIPUDEEP M.D. 11/07/2012, 1:12 PM

## 2012-11-17 ENCOUNTER — Ambulatory Visit: Payer: Self-pay

## 2012-11-25 ENCOUNTER — Ambulatory Visit: Payer: Self-pay | Attending: Internal Medicine

## 2012-12-05 ENCOUNTER — Ambulatory Visit: Payer: Self-pay | Attending: Internal Medicine

## 2013-01-07 ENCOUNTER — Ambulatory Visit: Payer: No Typology Code available for payment source | Attending: Internal Medicine | Admitting: Internal Medicine

## 2013-01-07 ENCOUNTER — Encounter: Payer: Self-pay | Admitting: Internal Medicine

## 2013-01-07 VITALS — BP 184/104 | HR 76 | Temp 98.7°F | Ht <= 58 in | Wt 135.4 lb

## 2013-01-07 DIAGNOSIS — R05 Cough: Secondary | ICD-10-CM

## 2013-01-07 LAB — TSH: TSH: 0.672 u[IU]/mL (ref 0.350–4.500)

## 2013-01-07 MED ORDER — GUAIFENESIN-CODEINE 100-10 MG/5ML PO SOLN: 5.0000 mL | Freq: Four times a day (QID) | ORAL | Status: DC | PRN

## 2013-01-07 MED ORDER — PANTOPRAZOLE SODIUM 40 MG PO TBEC: 40.0000 mg | DELAYED_RELEASE_TABLET | Freq: Every day | ORAL | Status: DC

## 2013-01-07 MED ORDER — CETIRIZINE HCL 10 MG PO TABS: 10.0000 mg | ORAL_TABLET | Freq: Every day | ORAL | Status: DC

## 2013-01-07 NOTE — Progress Notes (Unsigned)
Patient ID: Michelle Boyd, female   DOB: [birth year], 62 y.o.   MRN: [id number]   CC:  HPI: 62 year old female who presents for a cough. The patient states that she's had a cough for about 2-3 months. She describes her throat is scratchy, worse when she is in a recumbent position before she goes to bed at night. In the morning the cough is mostly more nonproductive, she does complain of a postnasal drip. The patient apparently has been taking over-the-counter medications but other than Robitussin cannot give me any of the names. She is from [place] and most of the history is obtained through language interpreter She denies any history of tobacco chewing or nicotine dependence She denies any chest pain any shortness of breath She states that she received the flu vaccine one month ago   No Known Allergies Past Medical History  Diagnosis Date  . Hypertension    Current Outpatient Prescriptions on File Prior to Visit  Medication Sig Dispense Refill  . famotidine (PEPCID) 20 MG tablet Take 1 tablet (20 mg total) by mouth 2 (two) times daily.  60 tablet  4  . lisinopril-hydrochlorothiazide (ZESTORETIC) 20-12.5 MG per tablet Take 1 tablet by mouth daily.  30 tablet  4  . aspirin 81 MG tablet Take 1 tablet (81 mg total) by mouth daily.  30 tablet    . ibuprofen (ADVIL,MOTRIN) 600 MG tablet Take 1 tablet (600 mg total) by mouth every 6 (six) hours as needed for pain.  30 tablet  3  . loratadine (CLARITIN) 10 MG tablet Take 1 tablet (10 mg total) by mouth daily.  30 tablet  4  . naproxen (NAPROSYN) 500 MG tablet Take 1 tablet (500 mg total) by mouth 2 (two) times daily with a meal.  60 tablet  4  . potassium chloride (K-DUR) 10 MEQ tablet Take 1 tablet (10 mEq total) by mouth 2 (two) times daily.  60 tablet  4  . [DISCONTINUED] chlorthalidone (HYGROTON) 25 MG tablet Take 25 mg by mouth daily.      . [DISCONTINUED] hydrALAZINE (APRESOLINE) 25 MG tablet Take 25 mg by mouth daily.      . [DISCONTINUED]  lisinopril (PRINIVIL,ZESTRIL) 10 MG tablet Take 10 mg by mouth daily.       No current facility-administered medications on file prior to visit.   No family history on file. History   Social History  . Marital Status: Married    Spouse Name: N/A    Number of Children: N/A  . Years of Education: N/A   Occupational History  . Not on file.   Social History Main Topics  . Smoking status: Never Smoker   . Smokeless tobacco: Not on file  . Alcohol Use: No  . Drug Use: No  . Sexual Activity: No   Other Topics Concern  . Not on file   Social History Narrative  . No narrative on file    Review of Systems  Constitutional: Negative for fever, chills, diaphoresis, activity change, appetite change and fatigue.  HENT: Negative for ear pain, nosebleeds, congestion, facial swelling, rhinorrhea, neck pain, neck stiffness and ear discharge.   Eyes: Negative for pain, discharge, redness, itching and visual disturbance.  Respiratory: Negative for cough, choking, chest tightness, shortness of breath, wheezing and stridor.   Cardiovascular: Negative for chest pain, palpitations and leg swelling.  Gastrointestinal: Negative for abdominal distention.  Genitourinary: Negative for dysuria, urgency, frequency, hematuria, flank pain, decreased urine volume, difficulty urinating and dyspareunia.  Musculoskeletal: Negative  for back pain, joint swelling, arthralgias and gait problem.  Neurological: Negative for dizziness, tremors, seizures, syncope, facial asymmetry, speech difficulty, weakness, light-headedness, numbness and headaches.  Hematological: Negative for adenopathy. Does not bruise/bleed easily.  Psychiatric/Behavioral: Negative for hallucinations, behavioral problems, confusion, dysphoric mood, decreased concentration and agitation.    Objective:   Filed Vitals:   01/07/13 1157  BP: 184/104  Pulse: 76  Temp: 98.7 F (37.1 C)    Physical Exam  Constitutional: Appears well-developed  and well-nourished. No distress.  HENT: Normocephalic. External right and left ear normal. Oropharynx is clear and moist.  Eyes: Conjunctivae and EOM are normal. PERRLA, no scleral icterus.  Neck: Normal ROM. Neck supple. No JVD. No tracheal deviation. No thyromegaly.  CVS: RRR, S1/S2 +, no murmurs, no gallops, no carotid bruit.  Pulmonary: Effort and breath sounds normal, no stridor, rhonchi, wheezes, rales.  Abdominal: Soft. BS +,  no distension, tenderness, rebound or guarding.  Musculoskeletal: Normal range of motion. No edema and no tenderness.  Lymphadenopathy: No lymphadenopathy noted, cervical, inguinal. Neuro: Alert. Normal reflexes, muscle tone coordination. No cranial nerve deficit. Skin: Skin is warm and dry. No rash noted. Not diaphoretic. No erythema. No pallor.  Psychiatric: Normal mood and affect. Behavior, judgment, thought content normal.   Lab Results  Component Value Date   WBC 17.7* 09/22/2012   HGB 11.4* 09/22/2012   HCT 34.7* 09/22/2012   MCV 75.4* 09/22/2012   PLT 195 09/22/2012   Lab Results  Component Value Date   CREATININE 0.86 09/22/2012   BUN 9 09/22/2012   NA 135 09/22/2012   K 3.4* 09/22/2012   CL 103 09/22/2012   CO2 23 09/22/2012    Lab Results  Component Value Date   HGBA1C 6.4* 04/04/2012   Lipid Panel     Component Value Date/Time   CHOL 190 04/04/2012 1710   TRIG 176* 04/04/2012 1710   HDL 50 04/04/2012 1710   CHOLHDL 3.8 04/04/2012 1710   VLDL 35 04/04/2012 1710   LDLCALC 105* 04/04/2012 1710       Assessment and plan:   Patient Active Problem List   Diagnosis Date Noted  . Knee pain, bilateral 11/07/2012  . Hypertension 05/26/2012  . Hypokalemia 05/26/2012   Chronic cough for 2 or 3 months Symptoms are consistent with cough secondary to gastroesophageal reflux We'll start the patient on Protonix Prescribed the patient antihistamine atrophy Chest x-ray will be obtained because of the coughs chronicity Patient has been prescribed Robitussin  codeine Followup in one week for the cough   Prediabetic We'll check hemoglobin A1c  Dyslipidemia we'll check a lipid panel       The patient was given clear instructions to go to ER or return to medical center if symptoms don't improve, worsen or new problems develop. The patient verbalized understanding. The patient was told to call to get any lab results if not heard anything in the next week.

## 2013-01-07 NOTE — Progress Notes (Unsigned)
Pt is here for a throat pain x1 week; itching, no pain, coughing with no discharge, taking Robitussin, but isn't helping.

## 2013-05-24 ENCOUNTER — Encounter (HOSPITAL_COMMUNITY): Payer: Self-pay | Admitting: Emergency Medicine

## 2013-05-24 ENCOUNTER — Emergency Department (HOSPITAL_COMMUNITY)
Admission: EM | Admit: 2013-05-24 | Discharge: 2013-05-24 | Disposition: A | Discharge status: Home / Self Care | Payer: Medicaid Other | Attending: Emergency Medicine | Admitting: Emergency Medicine

## 2013-05-24 DIAGNOSIS — R000 Tachycardia, unspecified: Secondary | ICD-10-CM | POA: Insufficient documentation

## 2013-05-24 DIAGNOSIS — G8929 Other chronic pain: Secondary | ICD-10-CM | POA: Insufficient documentation

## 2013-05-24 DIAGNOSIS — Z79899 Other long term (current) drug therapy: Secondary | ICD-10-CM | POA: Insufficient documentation

## 2013-05-24 DIAGNOSIS — Z791 Long term (current) use of non-steroidal anti-inflammatories (NSAID): Secondary | ICD-10-CM | POA: Insufficient documentation

## 2013-05-24 DIAGNOSIS — I10 Essential (primary) hypertension: Secondary | ICD-10-CM | POA: Insufficient documentation

## 2013-05-24 DIAGNOSIS — Z7982 Long term (current) use of aspirin: Secondary | ICD-10-CM | POA: Insufficient documentation

## 2013-05-24 DIAGNOSIS — M25569 Pain in unspecified knee: Secondary | ICD-10-CM | POA: Insufficient documentation

## 2013-05-24 DIAGNOSIS — J020 Streptococcal pharyngitis: Secondary | ICD-10-CM

## 2013-05-24 LAB — CBC WITH DIFFERENTIAL/PLATELET
BASOS PCT: 0 % (ref 0–1)
Basophils Absolute: 0 10*3/uL (ref 0.0–0.1)
EOS ABS: 0 10*3/uL (ref 0.0–0.7)
EOS PCT: 0 % (ref 0–5)
HCT: 38 % (ref 36.0–46.0)
Hemoglobin: 12.6 g/dL (ref 12.0–15.0)
LYMPHS ABS: 1.2 10*3/uL (ref 0.7–4.0)
Lymphocytes Relative: 7 % — ABNORMAL LOW (ref 12–46)
MCH: 25.7 pg — AB (ref 26.0–34.0)
MCHC: 33.2 g/dL (ref 30.0–36.0)
MCV: 77.4 fL — AB (ref 78.0–100.0)
Monocytes Absolute: 0.9 10*3/uL (ref 0.1–1.0)
Monocytes Relative: 5 % (ref 3–12)
Neutro Abs: 15.2 10*3/uL — ABNORMAL HIGH (ref 1.7–7.7)
Neutrophils Relative %: 88 % — ABNORMAL HIGH (ref 43–77)
PLATELETS: 219 10*3/uL (ref 150–400)
RBC: 4.91 MIL/uL (ref 3.87–5.11)
RDW: 15.3 % (ref 11.5–15.5)
WBC: 17.4 10*3/uL — ABNORMAL HIGH (ref 4.0–10.5)

## 2013-05-24 LAB — BASIC METABOLIC PANEL
BUN: 11 mg/dL (ref 6–23)
CALCIUM: 9.1 mg/dL (ref 8.4–10.5)
CO2: 24 mEq/L (ref 19–32)
Chloride: 99 mEq/L (ref 96–112)
Creatinine, Ser: 0.8 mg/dL (ref 0.50–1.10)
GFR, EST AFRICAN AMERICAN: 88 mL/min — AB (ref >90)
GFR, EST NON AFRICAN AMERICAN: 76 mL/min — AB (ref >90)
GLUCOSE: 113 mg/dL — AB (ref 70–99)
Potassium: 4 mEq/L (ref 3.7–5.3)
SODIUM: 138 meq/L (ref 137–147)

## 2013-05-24 LAB — RAPID STREP SCREEN (MED CTR MEBANE ONLY): Streptococcus, Group A Screen (Direct): POSITIVE — AB

## 2013-05-24 MED ORDER — SODIUM CHLORIDE 0.9 % IV BOLUS (SEPSIS)
1000.0000 mL | Freq: Once | INTRAVENOUS | Status: AC
Start: 2013-05-24 — End: 2013-05-24
  Administered 2013-05-24: 1000 mL via INTRAVENOUS

## 2013-05-24 MED ORDER — DEXAMETHASONE SODIUM PHOSPHATE 10 MG/ML IJ SOLN
10.0000 mg | Freq: Once | INTRAMUSCULAR | Status: AC
  Administered 2013-05-24: 10 mg via INTRAVENOUS
  Filled 2013-05-24: qty 1

## 2013-05-24 MED ORDER — PENICILLIN G BENZATHINE 1200000 UNIT/2ML IM SUSP
1.2000 10*6.[IU] | Freq: Once | INTRAMUSCULAR | Status: AC
  Administered 2013-05-24: 1.2 10*6.[IU] via INTRAMUSCULAR
  Filled 2013-05-24: qty 2

## 2013-05-24 MED ORDER — ACETAMINOPHEN 325 MG PO TABS
650.0000 mg | ORAL_TABLET | Freq: Once | ORAL | Status: AC
  Administered 2013-05-24: 650 mg via ORAL
  Filled 2013-05-24: qty 2

## 2013-05-24 MED ORDER — HYDROCODONE-ACETAMINOPHEN 7.5-325 MG/15ML PO SOLN: 10.0000 mL | Freq: Four times a day (QID) | ORAL | Status: DC | PRN

## 2013-05-24 MED ORDER — HYDROCODONE-ACETAMINOPHEN 7.5-325 MG/15ML PO SOLN
10.0000 mL | Freq: Once | ORAL | Status: AC
  Administered 2013-05-24: 10 mL via ORAL
  Filled 2013-05-24: qty 15

## 2013-05-24 NOTE — ED Notes (Signed)
C/o throat pain, patient is febrile and tachycardic.  Throat is red, tonsil are enlarged.

## 2013-05-24 NOTE — Discharge Instructions (Signed)
Strep Throat  Strep throat is an infection of the throat caused by a bacteria named Streptococcus pyogenes. Your caregiver may call the infection streptococcal "tonsillitis" or "pharyngitis" depending on whether there are signs of inflammation in the tonsils or back of the throat. Strep throat is most common in children aged 65 15 years during the cold months of the year, but it can occur in people of any age during any season. This infection is spread from person to person (contagious) through coughing, sneezing, or other close contact.  SYMPTOMS   · Fever or chills.  · Painful, swollen, red tonsils or throat.  · Pain or difficulty when swallowing.  · White or yellow spots on the tonsils or throat.  · Swollen, tender lymph nodes or "glands" of the neck or under the jaw.  · Red rash all over the body (rare).  DIAGNOSIS   Many different infections can cause the same symptoms. A test must be done to confirm the diagnosis so the right treatment can be given. A "rapid strep test" can help your caregiver make the diagnosis in a few minutes. If this test is not available, a light swab of the infected area can be used for a throat culture test. If a throat culture test is done, results are usually available in a day or two.  TREATMENT   Strep throat is treated with antibiotic medicine.  HOME CARE INSTRUCTIONS   · Gargle with 1 tsp of salt in 1 cup of warm water, 3 4 times per day or as needed for comfort.  · Family members who also have a sore throat or fever should be tested for strep throat and treated with antibiotics if they have the strep infection.  · Make sure everyone in your household washes their hands well.  · Do not share food, drinking cups, or personal items that could cause the infection to spread to others.  · You may need to eat a soft food diet until your sore throat gets better.  · Drink enough water and fluids to keep your urine clear or pale yellow. This will help prevent dehydration.  · Get plenty of  rest.  · Stay home from school, daycare, or work until you have been on antibiotics for 24 hours.  · Only take over-the-counter or prescription medicines for pain, discomfort, or fever as directed by your caregiver.  · If antibiotics are prescribed, take them as directed. Finish them even if you start to feel better.  SEEK MEDICAL CARE IF:   · The glands in your neck continue to enlarge.  · You develop a rash, cough, or earache.  · You cough up green, yellow-brown, or bloody sputum.  · You have pain or discomfort not controlled by medicines.  · Your problems seem to be getting worse rather than better.  SEEK IMMEDIATE MEDICAL CARE IF:   · You develop any new symptoms such as vomiting, severe headache, stiff or painful neck, chest pain, shortness of breath, or trouble swallowing.  · You develop severe throat pain, drooling, or changes in your voice.  · You develop swelling of the neck, or the skin on the neck becomes red and tender.  · You have a fever.  · You develop signs of dehydration, such as fatigue, dry mouth, and decreased urination.  · You become increasingly sleepy, or you cannot wake up completely.  Document Released: 02/03/2000 Document Revised: 01/23/2012 Document Reviewed: 04/06/2010  [employer]® Patient Information ©2014 [employer].

## 2013-05-24 NOTE — ED Notes (Signed)
Family reports that pt c/o sore throat and pain to left knee onset last night. Pt reports that she has had fever but unsure of exact temperature. Pt has not taken any medications for symptoms. Pt ambulatory to room with walking stick.

## 2013-05-24 NOTE — ED Provider Notes (Signed)
CSN: [id number]     Arrival date & time 05/24/13  [id number] History   First MD Initiated Contact with Patient 05/24/13 [phone]     Chief Complaint  Patient presents with  . Sore Throat  . Knee Pain  . Fever     (Consider location/radiation/quality/duration/timing/severity/associated sxs/prior Treatment) HPI  This a 63 year old female with history of hypertension who presents with sore throat and fever. Patient's family members translating at the bedside. Per the patient's family member, patient has had sore throat and fever since last night. Patient has felt warm but has not taken her temperature. Patient has had good oral intake. Rates sore throat pain at 7/10. Patient has a history of chronic bilateral knee pain and reports continued pain.  She has been ambulatory.  no recent injuries.  Patient denies any chest pain, shortness of breath, cough.  Past Medical History  Diagnosis Date  . Hypertension    Past Surgical History  Procedure Laterality Date  . External ear surgery     No family history on file. History  Substance Use Topics  . Smoking status: Never Smoker   . Smokeless tobacco: Not on file  . Alcohol Use: No   OB History   Grav Para Term Preterm Abortions TAB SAB Ect Mult Living                 Review of Systems  Constitutional: Positive for fever and chills.  HENT: Positive for sore throat. Negative for ear pain.   Respiratory: Negative for cough, chest tightness and shortness of breath.   Cardiovascular: Negative for chest pain.  Gastrointestinal: Negative for nausea, vomiting and abdominal pain.  Genitourinary: Negative for dysuria.  Musculoskeletal: Positive for myalgias. Negative for back pain and gait problem.       Knee pain  Skin: Negative for wound.  Neurological: Negative for headaches.  Psychiatric/Behavioral: Negative for confusion.  All other systems reviewed and are negative.      Allergies  Review of patient's allergies indicates no known  allergies.  Home Medications   Current Outpatient Rx  Name  Route  Sig  Dispense  Refill  . aspirin 81 MG tablet   Oral   Take 1 tablet (81 mg total) by mouth daily.   30 tablet      . cetirizine (ZYRTEC) 10 MG tablet   Oral   Take 1 tablet (10 mg total) by mouth daily.   30 tablet   11   . famotidine (PEPCID) 20 MG tablet   Oral   Take 1 tablet (20 mg total) by mouth 2 (two) times daily.   60 tablet   4   . guaiFENesin-codeine 100-10 MG/5ML syrup   Oral   Take 5 mLs by mouth every 6 (six) hours as needed for cough.   480 mL   0   . HYDROcodone-acetaminophen (HYCET) 7.5-325 mg/15 ml solution   Oral   Take 10 mLs by mouth every 6 (six) hours as needed for moderate pain.   120 mL   0   . ibuprofen (ADVIL,MOTRIN) 600 MG tablet   Oral   Take 1 tablet (600 mg total) by mouth every 6 (six) hours as needed for pain.   30 tablet   3   . lisinopril-hydrochlorothiazide (ZESTORETIC) 20-12.5 MG per tablet   Oral   Take 1 tablet by mouth daily.   30 tablet   4   . loratadine (CLARITIN) 10 MG tablet   Oral   Take 1 tablet (  10 mg total) by mouth daily.   30 tablet   4   . naproxen (NAPROSYN) 500 MG tablet   Oral   Take 1 tablet (500 mg total) by mouth 2 (two) times daily with a meal.   60 tablet   4   . pantoprazole (PROTONIX) 40 MG tablet   Oral   Take 1 tablet (40 mg total) by mouth daily.   30 tablet   3   . potassium chloride (K-DUR) 10 MEQ tablet   Oral   Take 1 tablet (10 mEq total) by mouth 2 (two) times daily.   60 tablet   4    BP 151/86  Pulse 96  Temp(Src) 100 F (37.8 C) (Oral)  Resp 18  SpO2 97% Physical Exam  Nursing note and vitals reviewed. Constitutional: She is oriented to person, place, and time.  Ill appearing, no acute distress  HENT:  Head: Normocephalic and atraumatic.  Erythema of the posterior oropharynx, bilateral tonsillar exudate with mild enlargement bilaterally, and uvula midline  Eyes: Pupils are equal, round, and  reactive to light.  Neck: Neck supple.  Cardiovascular: Regular rhythm and normal heart sounds.   tachycardia  Pulmonary/Chest: Effort normal and breath sounds normal. No respiratory distress. She has no wheezes.  Abdominal: Soft. Bowel sounds are normal. There is no tenderness. There is no rebound.  Musculoskeletal:  Normal range of motion of the bilateral knees, no overlying skin changes, no significant effusion  Neurological: She is alert and oriented to person, place, and time.  Skin: Skin is warm and dry. No rash noted.  Psychiatric: She has a normal mood and affect.    ED Course  Procedures (including critical care time) Labs Review Labs Reviewed  RAPID STREP SCREEN - Abnormal; Notable for the following:    Streptococcus, Group A Screen (Direct) POSITIVE (*)    All other components within normal limits  CBC WITH DIFFERENTIAL - Abnormal; Notable for the following:    WBC 17.4 (*)    MCV 77.4 (*)    MCH 25.7 (*)    Neutrophils Relative % 88 (*)    Neutro Abs 15.2 (*)    Lymphocytes Relative 7 (*)    All other components within normal limits  BASIC METABOLIC PANEL - Abnormal; Notable for the following:    Glucose, Bld 113 (*)    GFR calc non Af Amer 76 (*)    GFR calc Af Amer 88 (*)    All other components within normal limits   Imaging Review No results found.   EKG Interpretation None      MDM   Final diagnoses:  Strep pharyngitis    Patient presents with sore throat and fever. She is ill-appearing but nontoxic on exam. She has tonsillar exudate. History of strep throat. Patient was given fluids. Basic labwork was obtained and notable for leukocytosis of 17. Strep screen is positive. Patient was given Bicillin and Decadron. Patient will be orally challenged. Will be discharged home with pain medication. Given strict return precautions.  After history, exam, and medical workup I feel the patient has been appropriately medically screened and is safe for discharge  home. Pertinent diagnoses were discussed with the patient. Patient was given return precautions.     Shon Baton, MD 05/24/13 [phone]

## 2013-08-05 ENCOUNTER — Ambulatory Visit: Payer: Medicaid Other | Attending: Internal Medicine | Admitting: Internal Medicine

## 2013-08-05 ENCOUNTER — Encounter: Payer: Self-pay | Admitting: Internal Medicine

## 2013-08-05 VITALS — BP 190/82 | HR 78 | Temp 97.5°F | Resp 16 | Ht <= 58 in | Wt 134.0 lb

## 2013-08-05 DIAGNOSIS — M79609 Pain in unspecified limb: Secondary | ICD-10-CM | POA: Diagnosis not present

## 2013-08-05 DIAGNOSIS — I10 Essential (primary) hypertension: Secondary | ICD-10-CM | POA: Diagnosis not present

## 2013-08-05 DIAGNOSIS — M25561 Pain in right knee: Secondary | ICD-10-CM

## 2013-08-05 DIAGNOSIS — H538 Other visual disturbances: Secondary | ICD-10-CM | POA: Diagnosis not present

## 2013-08-05 DIAGNOSIS — M25569 Pain in unspecified knee: Secondary | ICD-10-CM | POA: Diagnosis not present

## 2013-08-05 DIAGNOSIS — Z8781 Personal history of (healed) traumatic fracture: Secondary | ICD-10-CM

## 2013-08-05 DIAGNOSIS — M25562 Pain in left knee: Secondary | ICD-10-CM

## 2013-08-05 MED ORDER — TRAMADOL HCL 50 MG PO TABS: 50.0000 mg | ORAL_TABLET | Freq: Two times a day (BID) | ORAL | Status: DC

## 2013-08-05 MED ORDER — LISINOPRIL-HYDROCHLOROTHIAZIDE 20-25 MG PO TABS: 1.0000 | ORAL_TABLET | Freq: Every day | ORAL | Status: DC

## 2013-08-05 MED ORDER — CLONIDINE HCL 0.1 MG PO TABS
0.2000 mg | ORAL_TABLET | Freq: Once | ORAL | Status: AC
  Administered 2013-08-05: 0.2 mg via ORAL

## 2013-08-05 NOTE — Progress Notes (Signed)
Patient ID: Michelle Boyd, female   DOB: 04/11/1950, 63 y.o.   MRN: [id number]  CC: f/u HTN and leg pain   HPI: Patient presents to clinic today for evaluation of leg pain and hypertension. Patient states that he has had right knee pain for the past month and a half.  He reports a burning sensation in his right knee with some swelling of BLE.  He has not tried anything for pain.  She states that she has a history of a broken left leg and a right leg fracture two years ago from a fall.  She states that she also has had bilateral wrist pain for the same time.  Patient also reports that she has had ear itching and nasal congestion for one week.  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  She states that she has been having a blurred and double vision in both eyes that is affecting her ability to write for past two months.    No Known Allergies Past Medical History  Diagnosis Date  . Hypertension    Current Outpatient Prescriptions on File Prior to Visit  Medication Sig Dispense Refill  . lisinopril-hydrochlorothiazide (ZESTORETIC) 20-12.5 MG per tablet Take 1 tablet by mouth daily.  30 tablet  4  . aspirin 81 MG tablet Take 1 tablet (81 mg total) by mouth daily.  30 tablet    . cetirizine (ZYRTEC) 10 MG tablet Take 1 tablet (10 mg total) by mouth daily.  30 tablet  11  . famotidine (PEPCID) 20 MG tablet Take 1 tablet (20 mg total) by mouth 2 (two) times daily.  60 tablet  4  . guaiFENesin-codeine 100-10 MG/5ML syrup Take 5 mLs by mouth every 6 (six) hours as needed for cough.  480 mL  0  . HYDROcodone-acetaminophen (HYCET) 7.5-325 mg/15 ml solution Take 10 mLs by mouth every 6 (six) hours as needed for moderate pain.  120 mL  0  . ibuprofen (ADVIL,MOTRIN) 600 MG tablet Take 1 tablet (600 mg total) by mouth every 6 (six) hours as needed for pain.  30 tablet  3  . loratadine (CLARITIN) 10 MG tablet Take 1 tablet (10 mg total) by mouth  daily.  30 tablet  4  . naproxen (NAPROSYN) 500 MG tablet Take 1 tablet (500 mg total) by mouth 2 (two) times daily with a meal.  60 tablet  4  . pantoprazole (PROTONIX) 40 MG tablet Take 1 tablet (40 mg total) by mouth daily.  30 tablet  3  . potassium chloride (K-DUR) 10 MEQ tablet Take 1 tablet (10 mEq total) by mouth 2 (two) times daily.  60 tablet  4  . [DISCONTINUED] chlorthalidone (HYGROTON) 25 MG tablet Take 25 mg by mouth daily.      . [DISCONTINUED] hydrALAZINE (APRESOLINE) 25 MG tablet Take 25 mg by mouth daily.      . [DISCONTINUED] lisinopril (PRINIVIL,ZESTRIL) 10 MG tablet Take 10 mg by mouth daily.       No current facility-administered medications on file prior to visit.   No family history on file. History   Social History  . Marital Status: Married    Spouse Name: N/A    Number of Children: N/A  . Years of Education: N/A   Occupational History  . Not on file.   Social History Main Topics  . Smoking status: Never Smoker   . Smokeless tobacco: Not  on file  . Alcohol Use: No  . Drug Use: No  . Sexual Activity: No   Other Topics Concern  . Not on file   Social History Narrative  . No narrative on file    Review of Systems: See HPI  Objective:   Filed Vitals:   08/05/13 1146  BP: 206/109  Pulse: 80  Temp: 97.5 F (36.4 C)  Resp: 16    Physical Exam: Constitutional: Patient appears well-developed and well-nourished. No distress. HENT: Normocephalic, atraumatic, External right and left ear normal. Oropharynx is clear and moist.  Eyes: Conjunctivae and EOM are normal. PERRLA, no scleral icterus. Neck: Normal ROM. Neck supple. No JVD. No tracheal deviation. No thyromegaly. CVS: RRR, S1/S2 +, no murmurs, no gallops, no carotid bruit.  Pulmonary: Effort and breath sounds normal, no stridor, rhonchi, wheezes, rales.  Abdominal: Soft. BS +,  no distension, tenderness, rebound or guarding.  Musculoskeletal: Pain with right knee range of motion. No edema  and no tenderness.  Lymphadenopathy: No lymphadenopathy noted, cervical Skin: Skin is warm and dry. No rash noted. Not diaphoretic. No erythema. No pallor. Psychiatric: Normal mood and affect. Behavior, judgment, thought content normal.  Lab Results  Component Value Date   WBC 17.4* 05/24/2013   HGB 12.6 05/24/2013   HCT 38.0 05/24/2013   MCV 77.4* 05/24/2013   PLT 219 05/24/2013   Lab Results  Component Value Date   CREATININE 0.80 05/24/2013   BUN 11 05/24/2013   NA 138 05/24/2013   K 4.0 05/24/2013   CL 99 05/24/2013   CO2 24 05/24/2013    Lab Results  Component Value Date   HGBA1C 6.4* 04/04/2012   Lipid Panel     Component Value Date/Time   CHOL 169 01/07/2013 1231   TRIG 199* 01/07/2013 1231   HDL 38* 01/07/2013 1231   CHOLHDL 4.4 01/07/2013 1231   VLDL 40 01/07/2013 1231   LDLCALC 91 01/07/2013 1231       Assessment and plan:   [name] was seen today for leg pain and hypertension.  Diagnoses and associated orders for this visit:  Knee pain, bilateral - Ambulatory referral to Sports Medicine - traMADol (ULTRAM) 50 MG tablet; Take 1 tablet (50 mg total) by mouth 2 (two) times daily.  HTN (hypertension) - cloNIDine (CATAPRES) tablet 0.2 mg; Take 2 tablets (0.2 mg total) by mouth once. - Increased medication to lisinopril-hydrochlorothiazide (PRINZIDE,ZESTORETIC) 20-25 MG per tablet; Take 1 tablet by mouth daily. Will repeat in BP in one week and again in two weeks if no improvement, may need norvasc added if no improvement.  Blurred vision - Ambulatory referral to Ophthalmology  History of fracture - HM DEXA SCAN - Vitamin D, 25-hydroxy    Return in about 3 months (around 11/05/2013) for Hypertension, 1 week nurse visit.       Holland CommonsKECK, Leon Goodnow, NP-C [facility] [phone] 08/09/2013, 8:30 PM

## 2013-08-05 NOTE — Progress Notes (Signed)
Patient here to follow up on HTN and leg pain.  Patient c/o itching inside ear had surgery while in [place].  Patient has hx of right leg fracture and broken left leg.  Patient also needs refills on medications.  Patient given 0.2 mg clonidine per standing orders. Cletus GashV. Keck, NP notified.

## 2013-08-06 ENCOUNTER — Telehealth: Payer: Self-pay | Admitting: Emergency Medicine

## 2013-08-06 ENCOUNTER — Other Ambulatory Visit: Payer: Self-pay | Admitting: Emergency Medicine

## 2013-08-06 LAB — VITAMIN D 25 HYDROXY (VIT D DEFICIENCY, FRACTURES): VIT D 25 HYDROXY: 18 ng/mL — AB (ref 30–89)

## 2013-08-06 MED ORDER — ERGOCALCIFEROL 1.25 MG (50000 UT) PO CAPS: 50000.0000 [IU] | ORAL_CAPSULE | ORAL | Status: DC

## 2013-08-06 NOTE — Telephone Encounter (Signed)
Left message for pt to return call for lab results per Nepali intepretor pacific line

## 2013-08-24 ENCOUNTER — Ambulatory Visit (INDEPENDENT_AMBULATORY_CARE_PROVIDER_SITE_OTHER): Payer: Medicaid Other | Admitting: Sports Medicine

## 2013-08-24 ENCOUNTER — Encounter: Payer: Self-pay | Admitting: Sports Medicine

## 2013-08-24 VITALS — BP 116/70 | Ht <= 58 in | Wt 134.0 lb

## 2013-08-24 DIAGNOSIS — M545 Low back pain, unspecified: Secondary | ICD-10-CM

## 2013-08-24 DIAGNOSIS — M4856XA Collapsed vertebra, not elsewhere classified, lumbar region, initial encounter for fracture: Secondary | ICD-10-CM | POA: Insufficient documentation

## 2013-08-24 DIAGNOSIS — M25561 Pain in right knee: Secondary | ICD-10-CM

## 2013-08-24 DIAGNOSIS — M25569 Pain in unspecified knee: Secondary | ICD-10-CM

## 2013-08-24 MED ORDER — METHYLPREDNISOLONE ACETATE 40 MG/ML IJ SUSP
40.0000 mg | Freq: Once | INTRAMUSCULAR | Status: AC
  Administered 2013-08-24: 40 mg via INTRA_ARTICULAR

## 2013-08-24 NOTE — Patient Instructions (Signed)
It was great seeing you today.   1. We will get xrays of your knee and back and see you back in 3 weeks to discuss the results 2. You got a steroid injection in your right knee today which should help with your pain, you may notice some mild increase in pain in the next 24 hours which can be relieved with ice. You should call/return to clinic if you develop any fevers, redness, or worsening pain 24 hour after your injection  Next Appointment  Please call to make an appointment with Dr Margaretha Sheffieldraper in 3 weeks   Take Care,   Dr Wenda LowJames Jaece Ducharme

## 2013-08-24 NOTE — Assessment & Plan Note (Signed)
Lumbago without concerning red flags - Back pain, likely secondary to compensation for knee pain - Since age greater than 50 will obtain x-rays - Follow-up in 3 weeks- if x-ray is negative will recommend lumbar strengthening exercises -Continue Ultram as needed

## 2013-08-24 NOTE — Assessment & Plan Note (Addendum)
Likely OA.- Due to history and physical, however, does have history of right knee injury 8 years ago and mildly increased laxity with anterior drawer and Lachman when compared to left knee - Will obtain x-rays - Steroid shot given today - Follow up in clinic in 3 weeks to discuss imaging and response to steroid injection  Right Knee injection Consent obtained and verified. Sterile cleansed with alcohol. Topical analgesic spray: Ethyl chloride. Joint: Rt knee Approached in typical fashion with: Lateral approach Completed without difficulty Meds: 1cc Depo-medrol 40mg ) & Lidocaine 3 cc Needle: 25 gauge Aftercare instructions and Red flags advised.

## 2013-08-24 NOTE — Progress Notes (Signed)
  Michelle Boyd - 63 y.o. female MRN [id number]  Date of birth: 06/15/1950    SUBJECTIVE:     She reports lower back and right knee pain x 3 months. Both described as achy pain that has gradually been getting worse. She denies any recent trauma but reports previous Rt knee injury ~ 8 years ago while in [place]. Her lower back pain radiates around her flanks, and her rt knee pain radiates up her femur. She reports mild swelling of her knee 3 mo ago at onset of pain which has resolved.  She denies any recent right knee swelling, erythema, or warmth.  She has been taking Ultram which helps some with the pain.   ROS:     NO: Night pain, night sweats, weight loss, fever, urinary incontinence, Lower ext weakness or numbness  PERTINENT  PMH / PSH FH / / SH:  Past Medical, Surgical, Social, and Family History Reviewed & Updated per EMR.  Pertinent Historical Findings include:   Previous rt knee injury in [place] ~ 2007; No surgery  OBJECTIVE: BP 116/70  Ht 4\' 10"  (1.473 m)  Wt 134 lb (60.782 kg)  BMI 28.01 kg/m2  Physical Exam:  Vital signs are reviewed.  Back: Spine with normal alignment and no deformity. Mild diffuse tenderness across lumbar area;  Paraspinous muscles are mildly tender and without spasm.  Negative straight leg and cross leg test  Rt Knee: Normal to inspection with no erythema or effusion or obvious bony abnormalities. Crepitus present Palpation normal with no warmth, joint line tenderness, patellar tenderness, or condyle tenderness. ROM full in flexion and extension Mild Ligament laxity on anterior drawer and Lachman - both +1  Mild painful patellar compression. Patellar and quadriceps tendons unremarkable. Hamstring and quadriceps strength is normal.   ASSESSMENT & PLAN:  See problem based charting & AVS for pt instructions.

## 2013-08-27 ENCOUNTER — Other Ambulatory Visit: Payer: Self-pay | Admitting: Family Medicine

## 2013-08-27 ENCOUNTER — Ambulatory Visit
Admission: RE | Admit: 2013-08-27 | Discharge: 2013-08-27 | Disposition: A | Discharge status: Home / Self Care | Payer: Medicaid Other | Source: Ambulatory Visit | Attending: Sports Medicine | Admitting: Sports Medicine

## 2013-08-27 DIAGNOSIS — M25561 Pain in right knee: Secondary | ICD-10-CM

## 2013-08-27 DIAGNOSIS — M545 Low back pain: Secondary | ICD-10-CM

## 2013-08-31 ENCOUNTER — Encounter (HOSPITAL_COMMUNITY): Payer: Self-pay | Admitting: Emergency Medicine

## 2013-08-31 ENCOUNTER — Emergency Department (HOSPITAL_COMMUNITY): Payer: Medicaid Other

## 2013-08-31 ENCOUNTER — Emergency Department (HOSPITAL_COMMUNITY)
Admission: EM | Admit: 2013-08-31 | Discharge: 2013-08-31 | Disposition: A | Discharge status: Home / Self Care | Payer: Medicaid Other | Attending: Emergency Medicine | Admitting: Emergency Medicine

## 2013-08-31 DIAGNOSIS — R109 Unspecified abdominal pain: Secondary | ICD-10-CM | POA: Diagnosis present

## 2013-08-31 DIAGNOSIS — I10 Essential (primary) hypertension: Secondary | ICD-10-CM | POA: Insufficient documentation

## 2013-08-31 DIAGNOSIS — G8929 Other chronic pain: Secondary | ICD-10-CM | POA: Diagnosis not present

## 2013-08-31 DIAGNOSIS — K8020 Calculus of gallbladder without cholecystitis without obstruction: Secondary | ICD-10-CM | POA: Insufficient documentation

## 2013-08-31 DIAGNOSIS — R1013 Epigastric pain: Secondary | ICD-10-CM | POA: Insufficient documentation

## 2013-08-31 DIAGNOSIS — R110 Nausea: Secondary | ICD-10-CM | POA: Insufficient documentation

## 2013-08-31 DIAGNOSIS — Z79899 Other long term (current) drug therapy: Secondary | ICD-10-CM | POA: Diagnosis not present

## 2013-08-31 HISTORY — DX: Pain in unspecified knee: M25.569

## 2013-08-31 HISTORY — DX: Other chronic pain: G89.29

## 2013-08-31 LAB — COMPREHENSIVE METABOLIC PANEL
ALT: 15 U/L (ref 0–35)
AST: 19 U/L (ref 0–37)
Albumin: 3.6 g/dL (ref 3.5–5.2)
Alkaline Phosphatase: 116 U/L (ref 39–117)
Anion gap: 13 (ref 5–15)
BILIRUBIN TOTAL: 0.8 mg/dL (ref 0.3–1.2)
BUN: 10 mg/dL (ref 6–23)
CO2: 23 meq/L (ref 19–32)
CREATININE: 0.68 mg/dL (ref 0.50–1.10)
Calcium: 8.8 mg/dL (ref 8.4–10.5)
Chloride: 98 mEq/L (ref 96–112)
GFR, EST NON AFRICAN AMERICAN: 90 mL/min — AB (ref >90)
GLUCOSE: 97 mg/dL (ref 70–99)
Potassium: 3.8 mEq/L (ref 3.7–5.3)
Sodium: 134 mEq/L — ABNORMAL LOW (ref 137–147)
Total Protein: 7.8 g/dL (ref 6.0–8.3)

## 2013-08-31 LAB — URINALYSIS, ROUTINE W REFLEX MICROSCOPIC
BILIRUBIN URINE: NEGATIVE
Glucose, UA: NEGATIVE mg/dL
KETONES UR: NEGATIVE mg/dL
Leukocytes, UA: NEGATIVE
Nitrite: NEGATIVE
Protein, ur: NEGATIVE mg/dL
Specific Gravity, Urine: 1.01 (ref 1.005–1.030)
UROBILINOGEN UA: 0.2 mg/dL (ref 0.0–1.0)
pH: 7 (ref 5.0–8.0)

## 2013-08-31 LAB — CBC WITH DIFFERENTIAL/PLATELET
BASOS ABS: 0 10*3/uL (ref 0.0–0.1)
Basophils Relative: 0 % (ref 0–1)
EOS PCT: 0 % (ref 0–5)
Eosinophils Absolute: 0 10*3/uL (ref 0.0–0.7)
HEMATOCRIT: 36.7 % (ref 36.0–46.0)
Hemoglobin: 11.9 g/dL — ABNORMAL LOW (ref 12.0–15.0)
LYMPHS PCT: 21 % (ref 12–46)
Lymphs Abs: 1.9 10*3/uL (ref 0.7–4.0)
MCH: 25 pg — ABNORMAL LOW (ref 26.0–34.0)
MCHC: 32.4 g/dL (ref 30.0–36.0)
MCV: 77.1 fL — ABNORMAL LOW (ref 78.0–100.0)
MONOS PCT: 6 % (ref 3–12)
Monocytes Absolute: 0.5 10*3/uL (ref 0.1–1.0)
Neutro Abs: 6.7 10*3/uL (ref 1.7–7.7)
Neutrophils Relative %: 73 % (ref 43–77)
Platelets: 180 10*3/uL (ref 150–400)
RBC: 4.76 MIL/uL (ref 3.87–5.11)
RDW: 15 % (ref 11.5–15.5)
WBC: 9.1 10*3/uL (ref 4.0–10.5)

## 2013-08-31 LAB — URINE MICROSCOPIC-ADD ON

## 2013-08-31 LAB — LIPASE, BLOOD: LIPASE: 20 U/L (ref 11–59)

## 2013-08-31 MED ORDER — ONDANSETRON 8 MG PO TBDP: 8.0000 mg | ORAL_TABLET | Freq: Three times a day (TID) | ORAL | Status: DC | PRN

## 2013-08-31 MED ORDER — ONDANSETRON HCL 4 MG/2ML IJ SOLN
4.0000 mg | Freq: Once | INTRAMUSCULAR | Status: AC
  Administered 2013-08-31: 4 mg via INTRAVENOUS
  Filled 2013-08-31: qty 2

## 2013-08-31 MED ORDER — TRAMADOL HCL 50 MG PO TABS: 50.0000 mg | ORAL_TABLET | Freq: Four times a day (QID) | ORAL | Status: DC | PRN

## 2013-08-31 MED ORDER — SODIUM CHLORIDE 0.9 % IV SOLN: Freq: Once | INTRAVENOUS | Status: DC

## 2013-08-31 MED ORDER — FAMOTIDINE IN NACL 20-0.9 MG/50ML-% IV SOLN
20.0000 mg | Freq: Once | INTRAVENOUS | Status: AC
  Administered 2013-08-31: 20 mg via INTRAVENOUS
  Filled 2013-08-31: qty 50

## 2013-08-31 MED ORDER — SODIUM CHLORIDE 0.9 % IV BOLUS (SEPSIS)
1000.0000 mL | Freq: Once | INTRAVENOUS | Status: AC
  Administered 2013-08-31: 1000 mL via INTRAVENOUS

## 2013-08-31 MED ORDER — MORPHINE SULFATE 4 MG/ML IJ SOLN
4.0000 mg | Freq: Once | INTRAMUSCULAR | Status: AC
  Administered 2013-08-31: 4 mg via INTRAVENOUS
  Filled 2013-08-31: qty 1

## 2013-08-31 NOTE — Discharge Instructions (Signed)
Please call the Surgeon and set up an appointment. Return to the ER if the symptoms are getting worse. Stay away from fatty foods.   Cholelithiasis Cholelithiasis (also called gallstones) is a form of gallbladder disease in which gallstones form in your gallbladder. The gallbladder is an organ that stores bile made in the liver, which helps digest fats. Gallstones begin as small crystals and slowly grow into stones. Gallstone pain occurs when the gallbladder spasms and a gallstone is blocking the duct. Pain can also occur when a stone passes out of the duct.  RISK FACTORS  Being female.   Having multiple pregnancies. Health care providers sometimes advise removing diseased gallbladders before future pregnancies.   Being obese.  Eating a diet heavy in fried foods and fat.   Being older than 60 years and increasing age.   Prolonged use of medicines containing female hormones.   Having diabetes mellitus.   Rapidly losing weight.   Having a family history of gallstones (heredity).  SYMPTOMS  Nausea.   Vomiting.  Abdominal pain.   Yellowing of the skin (jaundice).   Sudden pain. It may persist from several minutes to several hours.  Fever.   Tenderness to the touch. In some cases, when gallstones do not move into the bile duct, people have no pain or symptoms. These are called "silent" gallstones.  TREATMENT Silent gallstones do not need treatment. In severe cases, emergency surgery may be required. Options for treatment include:  Surgery to remove the gallbladder. This is the most common treatment.  Medicines. These do not always work and may take 6-12 months or more to work.  Shock wave treatment (extracorporeal biliary lithotripsy). In this treatment an ultrasound machine sends shock waves to the gallbladder to break gallstones into smaller pieces that can pass into the intestines or be dissolved by medicine. HOME CARE INSTRUCTIONS   Only take  over-the-counter or prescription medicines for pain, discomfort, or fever as directed by your health care provider.   Follow a low-fat diet until seen again by your health care provider. Fat causes the gallbladder to contract, which can result in pain.   Follow up with your health care provider as directed. Attacks are almost always recurrent and surgery is usually required for permanent treatment.  SEEK IMMEDIATE MEDICAL CARE IF:   Your pain increases and is not controlled by medicines.   You have a fever or persistent symptoms for more than 2-3 days.   You have a fever and your symptoms suddenly get worse.   You have persistent nausea and vomiting.  MAKE SURE YOU:   Understand these instructions.  Will watch your condition.  Will get help right away if you are not doing well or get worse. Document Released: 02/01/2005 Document Revised: 10/08/2012 Document Reviewed: 07/30/2012 [facility] Patient Information 2015 [employer] [place]. This information is not intended to replace advice given to you by your health care provider. Make sure you discuss any questions you have with your health care provider.

## 2013-08-31 NOTE — ED Provider Notes (Signed)
CSN: [id number]     Arrival date & time 08/31/13  1154 History   First MD Initiated Contact with Patient 08/31/13 1545     Chief Complaint  Patient presents with  . Abdominal Pain     (Consider location/radiation/quality/duration/timing/severity/associated sxs/prior Treatment) HPI Comments: Pt comes in with cc of abd pain. Pt has hx of GERD, HTN and takes nsaids regularly. Started having epigastric and RUQ abd pain last night around 3 am. Pain is non radiating, and described as squeezing pain, moderate to severe, constant. There is associated nausea, but no emesis. Pt has no hx of similar pain in the past. Pain has some anorexia. There is no specific aggravating or relieving factors.  Patient is a 63 y.o. female presenting with abdominal pain. The history is provided by the patient.  Abdominal Pain Associated symptoms: nausea   Associated symptoms: no chest pain, no constipation, no cough, no diarrhea, no hematuria, no shortness of breath and no vomiting     Past Medical History  Diagnosis Date  . Hypertension   . Chronic knee pain    Past Surgical History  Procedure Laterality Date  . External ear surgery     No family history on file. History  Substance Use Topics  . Smoking status: Never Smoker   . Smokeless tobacco: Not on file  . Alcohol Use: No   OB History   Grav Para Term Preterm Abortions TAB SAB Ect Mult Living                 Review of Systems  Constitutional: Positive for activity change.  Respiratory: Negative for cough, shortness of breath and wheezing.   Cardiovascular: Negative for chest pain and leg swelling.  Gastrointestinal: Positive for nausea and abdominal pain. Negative for vomiting, diarrhea, constipation, blood in stool and abdominal distention.  Genitourinary: Negative for hematuria and difficulty urinating.  Musculoskeletal: Negative for neck pain.  Skin: Negative for color change.      Allergies  Review of patient's allergies indicates  no known allergies.  Home Medications   Prior to Admission medications   Medication Sig Start Date End Date Taking? Authorizing Provider  ergocalciferol (VITAMIN D2) 50000 UNITS capsule Take 1 capsule (50,000 Units total) by mouth once a week. 08/06/13  Yes Ambrose FinlandValerie A Keck, NP  ibuprofen (ADVIL,MOTRIN) 600 MG tablet Take 1 tablet (600 mg total) by mouth every 6 (six) hours as needed for pain. 11/07/12  Yes Ripudeep Jenna LuoK Rai, MD  lisinopril-hydrochlorothiazide (PRINZIDE,ZESTORETIC) 20-25 MG per tablet Take 1 tablet by mouth daily. 08/05/13  Yes Ambrose FinlandValerie A Keck, NP  traMADol (ULTRAM) 50 MG tablet Take 1 tablet (50 mg total) by mouth 2 (two) times daily. 08/05/13  Yes Ambrose FinlandValerie A Keck, NP  ondansetron (ZOFRAN ODT) 8 MG disintegrating tablet Take 1 tablet (8 mg total) by mouth every 8 (eight) hours as needed for nausea. 08/31/13   Derwood KaplanAnkit Wendal Wilkie, MD  traMADol (ULTRAM) 50 MG tablet Take 1 tablet (50 mg total) by mouth every 6 (six) hours as needed. 08/31/13   Britanie Harshman, MD   BP 186/88  Pulse 79  Temp(Src) 98.3 F (36.8 C) (Oral)  Resp 17  SpO2 95% Physical Exam  Nursing note and vitals reviewed. Constitutional: She is oriented to person, place, and time. She appears well-developed and well-nourished.  HENT:  Head: Normocephalic and atraumatic.  Eyes: EOM are normal. Pupils are equal, round, and reactive to light.  Neck: Neck supple.  Cardiovascular: Normal rate, regular rhythm and normal heart sounds.  No murmur heard. Pulmonary/Chest: Effort normal. No respiratory distress.  Abdominal: Soft. She exhibits no distension. There is tenderness. There is no rebound and no guarding.  Epigastric and RUE tenderness, mild guarding in the epigastrium  Neurological: She is alert and oriented to person, place, and time.  Skin: Skin is warm and dry.    ED Course  Procedures (including critical care time) Labs Review Labs Reviewed  CBC WITH DIFFERENTIAL - Abnormal; Notable for the following:     Hemoglobin 11.9 (*)    MCV 77.1 (*)    MCH 25.0 (*)    All other components within normal limits  COMPREHENSIVE METABOLIC PANEL - Abnormal; Notable for the following:    Sodium 134 (*)    GFR calc non Af Amer 90 (*)    All other components within normal limits  URINALYSIS, ROUTINE W REFLEX MICROSCOPIC - Abnormal; Notable for the following:    Hgb urine dipstick SMALL (*)    All other components within normal limits  LIPASE, BLOOD  URINE MICROSCOPIC-ADD ON    Imaging Review US Abdomen Complete  08/31/2013   CLINICAL DATA:  Abdominal pain  EXAM: ULTRASOUND ABDOMEN COMPLETE  COMPARISON:  None.  FINDINGS: Gallbladder:  2 cm shadowing stone is identified. No gallbladder wall thickening or pericholecystic fluid. The sonographer reports no sonographic Murphy sign.  Common bile duct:  Diameter: Nondilated at 5-6 mm diameter.  Liver:  Coarsening of the echotexture suggests steatosis. No focal abnormality.  IVC:  No abnormality visualized.  Pancreas:  Visualized portion unremarkable.  Spleen:  Size and appearance within normal limits.  Right Kidney:  Length: 9.4 cm. Echogenicity within normal limits. No mass or hydronephrosis visualized.  Left Kidney:  Length: 10.7 cm. Echogenicity within normal limits. No mass or hydronephrosis visualized.  Abdominal aorta:  No aneurysm visualized.  Other findings:  None.  IMPRESSION: Cholelithiasis without gallbladder wall thickening or sonographic Murphy sign. No biliary dilatation.   Electronically Signed   By: Kennith Center M.D.   On: 08/31/2013 17:14   Dg Chest Port 1 View  08/31/2013   CLINICAL DATA:  63 year old female with epigastric pain, right upper quadrant pain, low back pain. Initial encounter. Query pneumoperitoneum.  EXAM: PORTABLE CHEST - 1 VIEW  COMPARISON:  Chest radiographs 09/22/2012.  FINDINGS: Portable AP semi upright view at at 1742 hrs. EKG leads project over the low right hemidiaphragm. No pneumoperitoneum identified.  Lower lung volumes and  lordotic view. Stable cardiac size and mediastinal contours. Visualized tracheal air column is within normal limits. No pneumothorax, pulmonary edema, pleural effusion or consolidation identified.  IMPRESSION: 1. No pneumoperitoneum identified on this semi upright portable view of the chest. 2.  No acute cardiopulmonary abnormality identified.   Electronically Signed   By: Augusto Gamble M.D.   On: 08/31/2013 17:58     EKG Interpretation   Date/Time:  Monday August 31 2013 12:00:30 EDT Ventricular Rate:  73 PR Interval:  150 QRS Duration: 78 QT Interval:  384 QTC Calculation: 423 R Axis:   9 Text Interpretation:  Normal sinus rhythm Normal ECG Confirmed by  Esmond Hinch, MD, Janey Genta [phone]) on 08/31/2013 4:18:19 PM      MDM   Final diagnoses:  Calculus of gallbladder without cholecystitis without obstruction    DDx includes: Pancreatitis Hepatobiliary pathology including cholecystitis Gastritis/PUD SBO ACS syndrome Aortic Dissection   Pt comes in with cc of abdominal pain. New pain, epigastric and RUQ, hx of nsaid use. Pt has RUQ tenderness, [place] ordered -and she  has cholelithiasis, with no evidence of acute cholecystitis. Labs are unremarkable. No emesis. Pain meds given, and her pain has improved.  I have discussed return precautions with the patient and the family. They will need to call surgery for further evaluation.  Derwood Kaplan, MD 08/31/13 1941

## 2013-08-31 NOTE — ED Notes (Signed)
Patient points to epigastric area for pain.   Patient states lower back pain also.   Patient states denies N/V/D, denies dizziness or SOB.

## 2013-08-31 NOTE — ED Notes (Signed)
Pt c/o epigastric pain x 1 night. Tenderness noted on palpation in RUQ. Pt sts pain is sharp, constant pain.  Pt speaks [place]. Pt denies similar episodes in the past.

## 2013-08-31 NOTE — ED Notes (Signed)
Dr. Nanvati at bedside  

## 2013-08-31 NOTE — ED Notes (Signed)
Dr.Nanvati aware of patient's blood pressure.  No orders at this time.

## 2013-09-05 ENCOUNTER — Emergency Department (HOSPITAL_COMMUNITY)
Admission: EM | Admit: 2013-09-05 | Discharge: 2013-09-05 | Disposition: A | Discharge status: Home / Self Care | Payer: Medicaid Other | Attending: Emergency Medicine | Admitting: Emergency Medicine

## 2013-09-05 ENCOUNTER — Encounter (HOSPITAL_COMMUNITY): Payer: Self-pay | Admitting: Emergency Medicine

## 2013-09-05 DIAGNOSIS — I10 Essential (primary) hypertension: Secondary | ICD-10-CM

## 2013-09-05 DIAGNOSIS — K8020 Calculus of gallbladder without cholecystitis without obstruction: Secondary | ICD-10-CM | POA: Diagnosis not present

## 2013-09-05 DIAGNOSIS — M25569 Pain in unspecified knee: Secondary | ICD-10-CM | POA: Insufficient documentation

## 2013-09-05 DIAGNOSIS — Z79899 Other long term (current) drug therapy: Secondary | ICD-10-CM | POA: Insufficient documentation

## 2013-09-05 DIAGNOSIS — G8929 Other chronic pain: Secondary | ICD-10-CM | POA: Insufficient documentation

## 2013-09-05 DIAGNOSIS — R1011 Right upper quadrant pain: Secondary | ICD-10-CM | POA: Diagnosis present

## 2013-09-05 DIAGNOSIS — K8050 Calculus of bile duct without cholangitis or cholecystitis without obstruction: Secondary | ICD-10-CM

## 2013-09-05 LAB — COMPREHENSIVE METABOLIC PANEL
ALK PHOS: 99 U/L (ref 39–117)
ALT: 18 U/L (ref 0–35)
ANION GAP: 15 (ref 5–15)
AST: 20 U/L (ref 0–37)
Albumin: 3.3 g/dL — ABNORMAL LOW (ref 3.5–5.2)
BUN: 8 mg/dL (ref 6–23)
CALCIUM: 8.8 mg/dL (ref 8.4–10.5)
CO2: 26 meq/L (ref 19–32)
Chloride: 98 mEq/L (ref 96–112)
Creatinine, Ser: 0.87 mg/dL (ref 0.50–1.10)
GFR, EST AFRICAN AMERICAN: 79 mL/min — AB (ref >90)
GFR, EST NON AFRICAN AMERICAN: 68 mL/min — AB (ref >90)
GLUCOSE: 103 mg/dL — AB (ref 70–99)
Potassium: 3.5 mEq/L — ABNORMAL LOW (ref 3.7–5.3)
Sodium: 139 mEq/L (ref 137–147)
Total Bilirubin: 0.5 mg/dL (ref 0.3–1.2)
Total Protein: 7.5 g/dL (ref 6.0–8.3)

## 2013-09-05 LAB — URINALYSIS, ROUTINE W REFLEX MICROSCOPIC
Bilirubin Urine: NEGATIVE
Glucose, UA: NEGATIVE mg/dL
KETONES UR: NEGATIVE mg/dL
Nitrite: NEGATIVE
Protein, ur: NEGATIVE mg/dL
Specific Gravity, Urine: 1.017 (ref 1.005–1.030)
UROBILINOGEN UA: 0.2 mg/dL (ref 0.0–1.0)
pH: 6 (ref 5.0–8.0)

## 2013-09-05 LAB — CBC WITH DIFFERENTIAL/PLATELET
Basophils Absolute: 0 10*3/uL (ref 0.0–0.1)
Basophils Relative: 0 % (ref 0–1)
Eosinophils Absolute: 0.3 10*3/uL (ref 0.0–0.7)
Eosinophils Relative: 4 % (ref 0–5)
HCT: 34.4 % — ABNORMAL LOW (ref 36.0–46.0)
Hemoglobin: 11 g/dL — ABNORMAL LOW (ref 12.0–15.0)
LYMPHS ABS: 1.8 10*3/uL (ref 0.7–4.0)
Lymphocytes Relative: 24 % (ref 12–46)
MCH: 25.1 pg — AB (ref 26.0–34.0)
MCHC: 32 g/dL (ref 30.0–36.0)
MCV: 78.5 fL (ref 78.0–100.0)
Monocytes Absolute: 0.7 10*3/uL (ref 0.1–1.0)
Monocytes Relative: 9 % (ref 3–12)
NEUTROS PCT: 63 % (ref 43–77)
Neutro Abs: 4.5 10*3/uL (ref 1.7–7.7)
PLATELETS: 189 10*3/uL (ref 150–400)
RBC: 4.38 MIL/uL (ref 3.87–5.11)
RDW: 15 % (ref 11.5–15.5)
WBC: 7.2 10*3/uL (ref 4.0–10.5)

## 2013-09-05 LAB — URINE MICROSCOPIC-ADD ON

## 2013-09-05 LAB — LIPASE, BLOOD: Lipase: 32 U/L (ref 11–59)

## 2013-09-05 MED ORDER — FAMOTIDINE 20 MG PO TABS
20.0000 mg | ORAL_TABLET | Freq: Once | ORAL | Status: AC
  Administered 2013-09-05: 20 mg via ORAL
  Filled 2013-09-05: qty 1

## 2013-09-05 MED ORDER — FAMOTIDINE 20 MG PO TABS: 20.0000 mg | ORAL_TABLET | Freq: Two times a day (BID) | ORAL | Status: DC

## 2013-09-05 MED ORDER — GI COCKTAIL ~~LOC~~
30.0000 mL | Freq: Once | ORAL | Status: AC
  Administered 2013-09-05: 30 mL via ORAL
  Filled 2013-09-05: qty 30

## 2013-09-05 MED ORDER — LISINOPRIL-HYDROCHLOROTHIAZIDE 20-25 MG PO TABS: 1.0000 | ORAL_TABLET | Freq: Every day | ORAL | Status: DC

## 2013-09-05 MED ORDER — OXYCODONE-ACETAMINOPHEN 5-325 MG PO TABS: 1.0000 | ORAL_TABLET | Freq: Four times a day (QID) | ORAL | Status: DC | PRN

## 2013-09-05 MED ORDER — OXYCODONE-ACETAMINOPHEN 5-325 MG PO TABS
2.0000 | ORAL_TABLET | Freq: Once | ORAL | Status: AC
  Administered 2013-09-05: 2 via ORAL
  Filled 2013-09-05: qty 2

## 2013-09-05 MED ORDER — PROMETHAZINE HCL 12.5 MG PO TABS
12.5000 mg | ORAL_TABLET | ORAL | Status: AC
  Administered 2013-09-05: 12.5 mg via ORAL
  Filled 2013-09-05: qty 1

## 2013-09-05 MED ORDER — PANTOPRAZOLE SODIUM 20 MG PO TBEC
20.0000 mg | DELAYED_RELEASE_TABLET | Freq: Once | ORAL | Status: AC
  Administered 2013-09-05: 20 mg via ORAL
  Filled 2013-09-05: qty 1

## 2013-09-05 NOTE — ED Notes (Addendum)
The pt was here on 7/13 for abd pain. She was instructed to f/u with [facility] but the daughter states when they called to get her an appt [facility] said they must have a referral from a family doctor to see the pt. Pt continues to have pain. She speaks nepali

## 2013-09-05 NOTE — ED Provider Notes (Signed)
CSN: [id number]     Arrival date & time 09/05/13  1721 History   First MD Initiated Contact with Patient 09/05/13 1859     Chief Complaint  Patient presents with  . Abdominal Pain     (Consider location/radiation/quality/duration/timing/severity/associated sxs/prior Treatment) Patient is a 63 y.o. female presenting with abdominal pain. The history is provided by the patient. The history is limited by a language barrier. A language interpreter was used.  Abdominal Pain Associated symptoms: nausea   Associated symptoms: no chest pain, no chills, no constipation, no cough, no diarrhea, no fever, no shortness of breath and no vomiting    Pt is a 63yo female dx with a non-obstructing gallstone on 7/13 discharged home with tramadol and zofran, advised to f/u with [facility]. Pt advised she needed PCP referral has f/u on 7/22 but presenting to ED today c/o continued RUQ pain and burning.  States burning is worse when she eats or drinks. Denies fever, vomiting or diarrhea.  Pain is constant.  States the tramadol does not help with pain.  Pain is aching, 10/10 at worst. Does not radiate to back.  Denies hx of previous abdominal surgeries.  Denies sick contacts or recent travel.     Past Medical History  Diagnosis Date  . Hypertension   . Chronic knee pain    Past Surgical History  Procedure Laterality Date  . External ear surgery     History reviewed. No pertinent family history. History  Substance Use Topics  . Smoking status: Never Smoker   . Smokeless tobacco: Not on file  . Alcohol Use: No   OB History   Grav Para Term Preterm Abortions TAB SAB Ect Mult Living                 Review of Systems  Constitutional: Negative for fever and chills.  Respiratory: Negative for cough and shortness of breath.   Cardiovascular: Negative for chest pain and palpitations.  Gastrointestinal: Positive for nausea and abdominal pain. Negative for vomiting, diarrhea and constipation.  All other systems  reviewed and are negative.     Allergies  Review of patient's allergies indicates no known allergies.  Home Medications   Prior to Admission medications   Medication Sig Start Date End Date Taking? Authorizing Provider  ergocalciferol (VITAMIN D2) 50000 UNITS capsule Take 1 capsule (50,000 Units total) by mouth once a week. 08/06/13  Yes Ambrose FinlandValerie A Keck, NP  ibuprofen (ADVIL,MOTRIN) 600 MG tablet Take 1 tablet (600 mg total) by mouth every 6 (six) hours as needed for pain. 11/07/12  Yes Ripudeep Jenna LuoK Rai, MD  ondansetron (ZOFRAN ODT) 8 MG disintegrating tablet Take 1 tablet (8 mg total) by mouth every 8 (eight) hours as needed for nausea. 08/31/13  Yes Derwood KaplanAnkit Nanavati, MD  traMADol (ULTRAM) 50 MG tablet Take 1 tablet (50 mg total) by mouth 2 (two) times daily. 08/05/13  Yes Ambrose FinlandValerie A Keck, NP  famotidine (PEPCID) 20 MG tablet Take 1 tablet (20 mg total) by mouth 2 (two) times daily. 09/05/13   Junius FinnerErin O'Malley, PA-C  lisinopril-hydrochlorothiazide (PRINZIDE,ZESTORETIC) 20-25 MG per tablet Take 1 tablet by mouth daily. 09/05/13   Junius FinnerErin O'Malley, PA-C  oxyCODONE-acetaminophen (PERCOCET/ROXICET) 5-325 MG per tablet Take 1-2 tablets by mouth every 6 (six) hours as needed for moderate pain or severe pain. 09/05/13   Junius FinnerErin O'Malley, PA-C   BP 184/78  Pulse 77  Temp(Src) 98.1 F (36.7 C) (Oral)  Resp 19  SpO2 98% Physical Exam  Nursing note and  vitals reviewed. Constitutional: She appears well-developed and well-nourished. No distress.  HENT:  Head: Normocephalic and atraumatic.  Eyes: Conjunctivae are normal. No scleral icterus.  Neck: Normal range of motion.  Cardiovascular: Normal rate, regular rhythm and normal heart sounds.   Pulmonary/Chest: Effort normal and breath sounds normal. No respiratory distress. She has no wheezes. She has no rales. She exhibits no tenderness.  Abdominal: Soft. Bowel sounds are normal. She exhibits no distension and no mass. There is tenderness ( RUQ). There is no rebound  and no guarding.  Soft, non-distended, tenderness in RUQ. No rebound or guarding. No CVAT  Musculoskeletal: Normal range of motion.  Neurological: She is alert.  Skin: Skin is warm and dry. She is not diaphoretic.    ED Course  Procedures (including critical care time) Labs Review Labs Reviewed  COMPREHENSIVE METABOLIC PANEL - Abnormal; Notable for the following:    Potassium 3.5 (*)    Glucose, Bld 103 (*)    Albumin 3.3 (*)    GFR calc non Af Amer 68 (*)    GFR calc Af Amer 79 (*)    All other components within normal limits  CBC WITH DIFFERENTIAL - Abnormal; Notable for the following:    Hemoglobin 11.0 (*)    HCT 34.4 (*)    MCH 25.1 (*)    All other components within normal limits  URINALYSIS, ROUTINE W REFLEX MICROSCOPIC - Abnormal; Notable for the following:    Hgb urine dipstick MODERATE (*)    Leukocytes, UA MODERATE (*)    All other components within normal limits  LIPASE, BLOOD  URINE MICROSCOPIC-ADD ON    Imaging Review No results found.   EKG Interpretation None      MDM   Final diagnoses:  Biliary colic    Pt is a 63yo female with known gallstone c/o persistent RUQ pain worse with eating and drinking. Denies vomiting or fever. On exam pt does have tenderness in RUQ w/o rebound or guarding. No CVAT. Labs: unremarkable.  Pain worse with drinking considering gastritis/ulcer vs biliary colic.  Discussed pt with Dr. Rubin Payor who consulted with [facility].  Pt will be contacted by [facility] to schedule a f/u appointment.  Will discharge pt home with pain medications and advised to f/u with PCP as scheduled. Return precautions provided. Pt verbalized understanding and agreement with tx plan.     Junius Finner, PA-C 09/06/13 1111

## 2013-09-05 NOTE — ED Notes (Signed)
PA at bedside with translator phone.

## 2013-09-07 NOTE — ED Provider Notes (Signed)
Medical screening examination/treatment/procedure(s) were performed by non-physician practitioner and as supervising physician I was immediately available for consultation/collaboration.   EKG Interpretation None       Carneshia Raker R. Jakerra Floyd, MD 09/07/13 0655 

## 2013-09-09 ENCOUNTER — Encounter: Payer: Self-pay | Admitting: Sports Medicine

## 2013-09-09 ENCOUNTER — Ambulatory Visit (INDEPENDENT_AMBULATORY_CARE_PROVIDER_SITE_OTHER): Payer: Medicaid Other | Admitting: Sports Medicine

## 2013-09-09 VITALS — BP 143/85 | HR 79 | Ht 60.0 in | Wt 135.0 lb

## 2013-09-09 DIAGNOSIS — M8448XD Pathological fracture, other site, subsequent encounter for fracture with routine healing: Secondary | ICD-10-CM | POA: Diagnosis present

## 2013-09-09 DIAGNOSIS — M25569 Pain in unspecified knee: Secondary | ICD-10-CM

## 2013-09-09 DIAGNOSIS — M25561 Pain in right knee: Secondary | ICD-10-CM

## 2013-09-09 DIAGNOSIS — M4856XD Collapsed vertebra, not elsewhere classified, lumbar region, subsequent encounter for fracture with routine healing: Secondary | ICD-10-CM

## 2013-09-09 NOTE — Assessment & Plan Note (Signed)
She is very pleased with the results of her steroid injection - Pain likely controlled by Percocet which she is taking for other pain - Followup when necessary

## 2013-09-09 NOTE — Assessment & Plan Note (Addendum)
Back pain is much improved today; 3/10 and without radiculopathy -X-rays show a stable compression fracture at L3. Clinically, patient is much improved without signs of neurological compromise distally - Review of EMR show DEXA scan was ordered by PCP - discuss this with patient and advised obtaining the Scan - Will defer treatment to PCP; recommend calcium and vitamin D with consideration bisphosphonates pending DEXA Results - Followup when necessary

## 2013-09-09 NOTE — Progress Notes (Signed)
  Michelle Boyd - 63 y.o. female MRN [id number]  Date of birth: 04/07/1950    SUBJECTIVE:     Is here today for followup of her lower back pain and right knee pain.  She reports much improvement in both her lower back and knee.  She currently rates her back pain as 3/10.  She continues to deny any lower extremity neuropathy, weakness, or bowel/bladder incontinence.   ROS:     See history of present illness  PERTINENT  PMH / PSH FH / / SH:  Past Medical, Surgical, Social, and Family History Reviewed & Updated in the EMR.  Pertinent findings include:   None  OBJECTIVE: BP 143/85  Pulse 79  Ht 5' (1.524 m)  Wt 135 lb (61.236 kg)  BMI 26.37 kg/m2  Physical Exam:  Vital signs are reviewed.  Back Exam:  Inspection: Unremarkable  SLR sitting: Negative bilaterally  Palpable tenderness: Left lumbar paraspinal.  No tenderness to percussion over the lumbar vertebrae Sensory change: Gross sensation intact to all lumbar and sacral dermatomes.  Reflexes: 2+ at both patellar tendons Strength at foot:  Plantar-flexion: 5/5 Dorsi-flexion: 5/5 Eversion: 5/5 Inversion: 5/5  Leg strength  Quad: 5/5 Hamstring: 5/5 Hip flexor: 5/5 Hip abductors: 5/5  Gait unremarkable.  Imaging reviewed Lumbar x-rays revealed a compression fracture at L3 and mild L4 compression fracture Right knee.  X-rays: medial and lateral OA  ASSESSMENT & PLAN:  See problem based charting & AVS for pt instructions.

## 2013-09-11 ENCOUNTER — Encounter: Payer: Self-pay | Admitting: Internal Medicine

## 2013-09-11 ENCOUNTER — Ambulatory Visit: Payer: Medicaid Other | Attending: Internal Medicine | Admitting: Internal Medicine

## 2013-09-11 VITALS — BP 128/85 | HR 67 | Temp 97.4°F | Resp 14

## 2013-09-11 DIAGNOSIS — I10 Essential (primary) hypertension: Secondary | ICD-10-CM

## 2013-09-11 DIAGNOSIS — K8000 Calculus of gallbladder with acute cholecystitis without obstruction: Secondary | ICD-10-CM

## 2013-09-11 MED ORDER — LISINOPRIL-HYDROCHLOROTHIAZIDE 20-25 MG PO TABS: 1.0000 | ORAL_TABLET | Freq: Every day | ORAL | Status: DC

## 2013-09-11 NOTE — Progress Notes (Signed)
Patient ID: Michelle Boyd, female   DOB: 12/06/1950, [age] y.o.   MRN: [id number]  CC: ED f/u for abdominal pain  HPI:  Patient presents to clinic today as a ED follow up for abdominal pain.  Patient was seen in the ER a few days ago and reported that she has RUQ pain that is aggravated by eating and drinking.  She was given famotidine and feels like it has not provided her much relief.  Today she complains that she has a constant burning pain in her stomach.   No Known Allergies Past Medical History  Diagnosis Date  . Hypertension   . Chronic knee pain    Current Outpatient Prescriptions on File Prior to Visit  Medication Sig Dispense Refill  . ergocalciferol (VITAMIN D2) 50000 UNITS capsule Take 1 capsule (50,000 Units total) by mouth once a week.  6 capsule  0  . famotidine (PEPCID) 20 MG tablet Take 1 tablet (20 mg total) by mouth 2 (two) times daily.  30 tablet  0  . ibuprofen (ADVIL,MOTRIN) 600 MG tablet Take 1 tablet (600 mg total) by mouth every 6 (six) hours as needed for pain.  30 tablet  3  . lisinopril-hydrochlorothiazide (PRINZIDE,ZESTORETIC) 20-25 MG per tablet Take 1 tablet by mouth daily.  30 tablet  0  . ondansetron (ZOFRAN ODT) 8 MG disintegrating tablet Take 1 tablet (8 mg total) by mouth every 8 (eight) hours as needed for nausea.  20 tablet  0  . oxyCODONE-acetaminophen (PERCOCET/ROXICET) 5-325 MG per tablet Take 1-2 tablets by mouth every 6 (six) hours as needed for moderate pain or severe pain.  15 tablet  0  . traMADol (ULTRAM) 50 MG tablet Take 1 tablet (50 mg total) by mouth 2 (two) times daily.  60 tablet  0  . [DISCONTINUED] chlorthalidone (HYGROTON) 25 MG tablet Take 25 mg by mouth daily.      . [DISCONTINUED] hydrALAZINE (APRESOLINE) 25 MG tablet Take 25 mg by mouth daily.      . [DISCONTINUED] lisinopril (PRINIVIL,ZESTRIL) 10 MG tablet Take 10 mg by mouth daily.       No current facility-administered medications on file prior to visit.   History reviewed. No pertinent  family history. History   Social History  . Marital Status: Married    Spouse Name: N/A    Number of Children: N/A  . Years of Education: N/A   Occupational History  . Not on file.   Social History Main Topics  . Smoking status: Never Smoker   . Smokeless tobacco: Not on file  . Alcohol Use: No  . Drug Use: No  . Sexual Activity: No   Other Topics Concern  . Not on file   Social History Narrative  . No narrative on file    Review of Systems: See HPI    Objective:   Filed Vitals:   09/11/13 1545  BP: 128/85  Pulse: 67  Temp: 97.4 F (36.3 C)  Resp: 14    Physical Exam  Constitutional: She is oriented to person, place, and time.  Cardiovascular: Normal rate, regular rhythm and normal heart sounds.   Pulmonary/Chest: Effort normal and breath sounds normal.  Abdominal: She exhibits no distension. There is tenderness (ruq).  Neurological: She is alert and oriented to person, place, and time.     Lab Results  Component Value Date   WBC 7.2 09/05/2013   HGB 11.0* 09/05/2013   HCT 34.4* 09/05/2013   MCV 78.5 09/05/2013   PLT 189  09/05/2013   Lab Results  Component Value Date   CREATININE 0.87 09/05/2013   BUN 8 09/05/2013   NA 139 09/05/2013   K 3.5* 09/05/2013   CL 98 09/05/2013   CO2 26 09/05/2013    Lab Results  Component Value Date   HGBA1C 6.4* 04/04/2012   Lipid Panel     Component Value Date/Time   CHOL 169 01/07/2013 1231   TRIG 199* 01/07/2013 1231   HDL 38* 01/07/2013 1231   CHOLHDL 4.4 01/07/2013 1231   VLDL 40 01/07/2013 1231   LDLCALC 91 01/07/2013 1231       Assessment and plan:   Brodi was seen today for follow-up.  Diagnoses and associated orders for this visit:  Calculus of gallbladder with acute cholecystitis without obstruction - Ambulatory referral to General Surgery  Essential hypertension - lisinopril-hydrochlorothiazide (PRINZIDE,ZESTORETIC) 20-25 MG per tablet; Take 1 tablet by mouth daily.         [place]land [place],  NP-C [facility] [phone] 09/11/2013, 3:57 PM

## 2013-09-11 NOTE — Progress Notes (Signed)
Pt is here today still having abdomen pain after her ED visit.

## 2013-09-14 ENCOUNTER — Ambulatory Visit: Payer: Medicaid Other | Admitting: Sports Medicine

## 2013-09-29 ENCOUNTER — Ambulatory Visit (INDEPENDENT_AMBULATORY_CARE_PROVIDER_SITE_OTHER): Payer: Medicaid Other | Admitting: General Surgery

## 2013-09-29 ENCOUNTER — Encounter (INDEPENDENT_AMBULATORY_CARE_PROVIDER_SITE_OTHER): Payer: Self-pay | Admitting: General Surgery

## 2013-09-29 DIAGNOSIS — K8020 Calculus of gallbladder without cholecystitis without obstruction: Secondary | ICD-10-CM

## 2013-09-29 NOTE — Patient Instructions (Signed)
The episodes of right upper quadrant abdominal pain, nausea and vomiting are very typical of gallbladder attacks.  Your gallbladder x-ray confirms a large gallstone and no other significant problems were seen.  It is very likely that you are having gallbladder attacks and that this will continue in the future.  You stated he would like to go ahead with gallbladder surgery.  The gallbladder surgery will be scheduled in the near future.     Laparoscopic Cholecystectomy Laparoscopic cholecystectomy is surgery to remove the gallbladder. The gallbladder is located in the upper right part of the abdomen, behind the liver. It is a storage sac for bile produced in the liver. Bile aids in the digestion and absorption of fats. Cholecystectomy is often done for inflammation of the gallbladder (cholecystitis). This condition is usually caused by a buildup of gallstones (cholelithiasis) in your gallbladder. Gallstones can block the flow of bile, resulting in inflammation and pain. In severe cases, emergency surgery may be required. When emergency surgery is not required, you will have time to prepare for the procedure. Laparoscopic surgery is an alternative to open surgery. Laparoscopic surgery has a shorter recovery time. Your common bile duct may also need to be examined during the procedure. If stones are found in the common bile duct, they may be removed. LET [facility] CARE PROVIDER KNOW ABOUT:  Any allergies you have.  All medicines you are taking, including vitamins, herbs, eye drops, creams, and over-the-counter medicines.  Previous problems you or members of your family have had with the use of anesthetics.  Any blood disorders you have.  Previous surgeries you have had.  Medical conditions you have. RISKS AND COMPLICATIONS Generally, this is a safe procedure. However, as with any procedure, complications can occur. Possible complications include:  Infection.  Damage to the common bile  duct, nerves, arteries, veins, or other internal organs such as the stomach, liver, or intestines.  Bleeding.  A stone may remain in the common bile duct.  A bile leak from the cyst duct that is clipped when your gallbladder is removed.  The need to convert to open surgery, which requires a larger incision in the abdomen. This may be necessary if your surgeon thinks it is not safe to continue with a laparoscopic procedure. BEFORE THE PROCEDURE  Ask your health care provider about changing or stopping any regular medicines. You will need to stop taking aspirin or blood thinners at least 5 days prior to surgery.  Do not eat or drink anything after midnight the night before surgery.  Let your health care provider know if you develop a cold or other infectious problem before surgery. PROCEDURE   You will be given medicine to make you sleep through the procedure (general anesthetic). A breathing tube will be placed in your mouth.  When you are asleep, your surgeon will make several small cuts (incisions) in your abdomen.  A thin, lighted tube with a tiny camera on the end (laparoscope) is inserted through one of the small incisions. The camera on the laparoscope sends a picture to a TV screen in the operating room. This gives the surgeon a good view inside your abdomen.  A gas will be pumped into your abdomen. This expands your abdomen so that the surgeon has more room to perform the surgery.  Other tools needed for the procedure are inserted through the other incisions. The gallbladder is removed through one of the incisions.  After the removal of your gallbladder, the incisions will be closed  with stitches, staples, or skin glue. AFTER THE PROCEDURE  You will be taken to a recovery area where your progress will be checked often.  You may be allowed to go home the same day if your pain is controlled and you can tolerate liquids. Document Released: 02/05/2005 Document Revised: 11/26/2012  Document Reviewed: 09/17/2012 [facility] Patient Information 2015 [employer], [place]. This information is not intended to replace advice given to you by your health care provider. Make sure you discuss any questions you have with your health care provider.

## 2013-09-29 NOTE — Progress Notes (Signed)
Patient ID: Michelle Boyd, female   DOB: 1950/03/28, 63 y.o.   MRN: [id number]  Chief Complaint  Patient presents with  . New Evaluation    G.B    HPI Michelle Boyd is a 63 y.o. female.  She is referred by Holland Commons, NP for evaluation and management of symptomatic gallstones.   The patient is from [place]. She speaks no [place]. Her daughter-in-law is with her today and translates very well.  The patient has a 6 week history of intermittent and now almost daily episodes of right upper quadrant pain, postprandial with some nausea and some vomiting. She was seen in the emergency department about 2 weeks ago. Ultrasound showed a 2 cm gallstone but no inflammation. All lab work and urinalysis was normal. She was referred to her PCP and then referred here.  Comorbidities include GERD and hypertension. She's had what looks like a right mastoidectomy. She is fairly healthy for her age. HPI  Past Medical History  Diagnosis Date  . Hypertension   . Chronic knee pain     Past Surgical History  Procedure Laterality Date  . External ear surgery      No family history on file.  Social History History  Substance Use Topics  . Smoking status: Never Smoker   . Smokeless tobacco: Not on file  . Alcohol Use: No    No Known Allergies  Current Outpatient Prescriptions  Medication Sig Dispense Refill  . ergocalciferol (VITAMIN D2) 50000 UNITS capsule Take 1 capsule (50,000 Units total) by mouth once a week.  6 capsule  0  . famotidine (PEPCID) 20 MG tablet Take 1 tablet (20 mg total) by mouth 2 (two) times daily.  30 tablet  0  . ibuprofen (ADVIL,MOTRIN) 600 MG tablet Take 1 tablet (600 mg total) by mouth every 6 (six) hours as needed for pain.  30 tablet  3  . lisinopril-hydrochlorothiazide (PRINZIDE,ZESTORETIC) 20-25 MG per tablet Take 1 tablet by mouth daily.  30 tablet  3  . ondansetron (ZOFRAN ODT) 8 MG disintegrating tablet Take 1 tablet (8 mg total) by mouth every 8 (eight) hours as  needed for nausea.  20 tablet  0  . oxyCODONE-acetaminophen (PERCOCET/ROXICET) 5-325 MG per tablet Take 1-2 tablets by mouth every 6 (six) hours as needed for moderate pain or severe pain.  15 tablet  0  . traMADol (ULTRAM) 50 MG tablet Take 1 tablet (50 mg total) by mouth 2 (two) times daily.  60 tablet  0  . [DISCONTINUED] chlorthalidone (HYGROTON) 25 MG tablet Take 25 mg by mouth daily.      . [DISCONTINUED] hydrALAZINE (APRESOLINE) 25 MG tablet Take 25 mg by mouth daily.      . [DISCONTINUED] lisinopril (PRINIVIL,ZESTRIL) 10 MG tablet Take 10 mg by mouth daily.       No current facility-administered medications for this visit.    Review of Systems Review of Systems  Constitutional: Negative for fever, chills and unexpected weight change.  HENT: Negative for congestion, hearing loss, sore throat, trouble swallowing and voice change.   Eyes: Negative for visual disturbance.  Respiratory: Negative for cough and wheezing.   Cardiovascular: Negative for chest pain, palpitations and leg swelling.  Gastrointestinal: Positive for nausea, vomiting, abdominal pain and diarrhea. Negative for constipation, blood in stool, abdominal distention and anal bleeding.  Genitourinary: Negative for hematuria, vaginal bleeding and difficulty urinating.  Musculoskeletal: Negative for arthralgias.  Skin: Negative for rash and wound.  Neurological: Negative for seizures, syncope and headaches.  Hematological: Negative for adenopathy. Does not bruise/bleed easily.  Psychiatric/Behavioral: Negative for confusion.    There were no vitals taken for this visit.  Physical Exam Physical Exam  Constitutional: She is oriented to person, place, and time. She appears well-developed and well-nourished. No distress.  Daughter-in-law is with her throughout the encounter.  HENT:  Head: Normocephalic and atraumatic.  Nose: Nose normal.  Mouth/Throat: No oropharyngeal exudate.  Eyes: Conjunctivae and EOM are normal.  Pupils are equal, round, and reactive to light. Left eye exhibits no discharge. No scleral icterus.  Neck: Neck supple. No JVD present. No tracheal deviation present. No thyromegaly present.  Well-healed scar behind right ear.  Cardiovascular: Normal rate, regular rhythm, normal heart sounds and intact distal pulses.   No murmur heard. Pulmonary/Chest: Effort normal and breath sounds normal. No respiratory distress. She has no wheezes. She has no rales. She exhibits no tenderness.  Abdominal: Soft. Bowel sounds are normal. She exhibits no distension and no mass. There is no tenderness. There is no rebound and no guarding.  Subjectively tender in the right upper quadrant but no mass and no guarding. I don't see any scars or hernias.  Musculoskeletal: She exhibits no edema and no tenderness.  Lymphadenopathy:    She has no cervical adenopathy.  Neurological: She is alert and oriented to person, place, and time. She exhibits normal muscle tone. Coordination normal.  Skin: Skin is warm. No rash noted. She is not diaphoretic. No erythema. No pallor.  Psychiatric: She has a normal mood and affect. Her behavior is normal. Judgment and thought content normal.    Data Reviewed ED records. Ultrasound. Lab work.  Assessment    Chronic cholecystitis with cholelithiasis. Almost certainly she is having typical episodes of biliary colic. She will most likely benefit from cholecystectomy  Hypertension  Possible GERD     Plan    I had a long discussion with the patient and her daughter-in-law as Nurse, learning disabilitytranslator. Basically told her that most likely this was due to gallstones and that most likely she would benefit from gallbladder surgery. I went over the patient information booklet with the pictures and gave that to her.She would very much like to go ahead and plan surgery. Marland Kitchen.  She will be scheduled for laparoscopic cholecystectomy with cholangiogram, possible open.  I have carefully discussed the  indications, details, techniques, and numerous risk of surgery with the patient using her daughter-in-law as a  Nurse, learning disabilitytranslator. They're aware of the risk of bleeding, infection, conversion to open laparotomy, bile leak, wound healing problems such as hernia, injury to adjacent organs with major reconstructive surgery. They understand all these issues. All their questions are answered. They agree with this plan.  We will plan to Observe overnight due to her age and language barrier       Ernestene MentionNGRAM,Rebecca Motta M 09/29/2013, 4:16 PM

## 2013-10-01 ENCOUNTER — Encounter (HOSPITAL_COMMUNITY): Payer: Self-pay | Admitting: Emergency Medicine

## 2013-10-01 ENCOUNTER — Inpatient Hospital Stay (HOSPITAL_COMMUNITY)
Admission: EM | Admit: 2013-10-01 | Discharge: 2013-10-05 | DRG: 418 | Disposition: A | Discharge status: Home / Self Care | Payer: Medicaid Other | Attending: Internal Medicine | Admitting: Internal Medicine

## 2013-10-01 DIAGNOSIS — D509 Iron deficiency anemia, unspecified: Secondary | ICD-10-CM

## 2013-10-01 DIAGNOSIS — K8050 Calculus of bile duct without cholangitis or cholecystitis without obstruction: Secondary | ICD-10-CM

## 2013-10-01 DIAGNOSIS — K859 Acute pancreatitis without necrosis or infection, unspecified: Principal | ICD-10-CM | POA: Diagnosis present

## 2013-10-01 DIAGNOSIS — Z79899 Other long term (current) drug therapy: Secondary | ICD-10-CM

## 2013-10-01 DIAGNOSIS — I10 Essential (primary) hypertension: Secondary | ICD-10-CM | POA: Diagnosis present

## 2013-10-01 DIAGNOSIS — K851 Biliary acute pancreatitis without necrosis or infection: Secondary | ICD-10-CM | POA: Diagnosis present

## 2013-10-01 DIAGNOSIS — K8020 Calculus of gallbladder without cholecystitis without obstruction: Secondary | ICD-10-CM

## 2013-10-01 DIAGNOSIS — K8062 Calculus of gallbladder and bile duct with acute cholecystitis without obstruction: Secondary | ICD-10-CM | POA: Diagnosis present

## 2013-10-01 DIAGNOSIS — E860 Dehydration: Secondary | ICD-10-CM | POA: Diagnosis present

## 2013-10-01 DIAGNOSIS — M25569 Pain in unspecified knee: Secondary | ICD-10-CM | POA: Diagnosis present

## 2013-10-01 DIAGNOSIS — E162 Hypoglycemia, unspecified: Secondary | ICD-10-CM | POA: Diagnosis present

## 2013-10-01 DIAGNOSIS — M25561 Pain in right knee: Secondary | ICD-10-CM

## 2013-10-01 DIAGNOSIS — G8929 Other chronic pain: Secondary | ICD-10-CM | POA: Diagnosis present

## 2013-10-01 DIAGNOSIS — Z23 Encounter for immunization: Secondary | ICD-10-CM

## 2013-10-01 DIAGNOSIS — D61818 Other pancytopenia: Secondary | ICD-10-CM

## 2013-10-01 DIAGNOSIS — E876 Hypokalemia: Secondary | ICD-10-CM

## 2013-10-01 DIAGNOSIS — K219 Gastro-esophageal reflux disease without esophagitis: Secondary | ICD-10-CM | POA: Diagnosis present

## 2013-10-01 DIAGNOSIS — R252 Cramp and spasm: Secondary | ICD-10-CM

## 2013-10-01 DIAGNOSIS — K810 Acute cholecystitis: Secondary | ICD-10-CM

## 2013-10-01 HISTORY — DX: Gastro-esophageal reflux disease without esophagitis: K21.9

## 2013-10-01 HISTORY — DX: Biliary acute pancreatitis without necrosis or infection: K85.10

## 2013-10-01 HISTORY — DX: Calculus of gallbladder without cholecystitis without obstruction: K80.20

## 2013-10-01 LAB — CBC WITH DIFFERENTIAL/PLATELET
BASOS PCT: 0 % (ref 0–1)
Basophils Absolute: 0 10*3/uL (ref 0.0–0.1)
EOS ABS: 0.1 10*3/uL (ref 0.0–0.7)
Eosinophils Relative: 2 % (ref 0–5)
HCT: 34.5 % — ABNORMAL LOW (ref 36.0–46.0)
Hemoglobin: 11.4 g/dL — ABNORMAL LOW (ref 12.0–15.0)
LYMPHS PCT: 47 % — AB (ref 12–46)
Lymphs Abs: 1.7 10*3/uL (ref 0.7–4.0)
MCH: 25.1 pg — ABNORMAL LOW (ref 26.0–34.0)
MCHC: 33 g/dL (ref 30.0–36.0)
MCV: 76 fL — AB (ref 78.0–100.0)
Monocytes Absolute: 0.5 10*3/uL (ref 0.1–1.0)
Monocytes Relative: 13 % — ABNORMAL HIGH (ref 3–12)
Neutro Abs: 1.4 10*3/uL — ABNORMAL LOW (ref 1.7–7.7)
Neutrophils Relative %: 38 % — ABNORMAL LOW (ref 43–77)
PLATELETS: 139 10*3/uL — AB (ref 150–400)
RBC: 4.54 MIL/uL (ref 3.87–5.11)
RDW: 15.7 % — AB (ref 11.5–15.5)
WBC: 3.8 10*3/uL — AB (ref 4.0–10.5)

## 2013-10-01 LAB — COMPREHENSIVE METABOLIC PANEL
ALK PHOS: 107 U/L (ref 39–117)
ALT: 25 U/L (ref 0–35)
AST: 31 U/L (ref 0–37)
Albumin: 3.6 g/dL (ref 3.5–5.2)
Anion gap: 14 (ref 5–15)
BUN: 13 mg/dL (ref 6–23)
CHLORIDE: 98 meq/L (ref 96–112)
CO2: 22 mEq/L (ref 19–32)
CREATININE: 0.88 mg/dL (ref 0.50–1.10)
Calcium: 9.1 mg/dL (ref 8.4–10.5)
GFR calc Af Amer: 78 mL/min — ABNORMAL LOW (ref >90)
GFR, EST NON AFRICAN AMERICAN: 67 mL/min — AB (ref >90)
Glucose, Bld: 92 mg/dL (ref 70–99)
Potassium: 4 mEq/L (ref 3.7–5.3)
Sodium: 134 mEq/L — ABNORMAL LOW (ref 137–147)
Total Bilirubin: 0.4 mg/dL (ref 0.3–1.2)
Total Protein: 7.7 g/dL (ref 6.0–8.3)

## 2013-10-01 LAB — LIPASE, BLOOD: LIPASE: 115 U/L — AB (ref 11–59)

## 2013-10-01 NOTE — ED Notes (Signed)
Nepali speaking only-interpretuer phones used. C?o one month of right sided abdominal pain-DX with gallstones-now having nausea, vomitng and diarrhea began either last night or 3 nights ago-interpretation was difficult by phone- pain feels same as gallstone pain one month ago.

## 2013-10-02 ENCOUNTER — Emergency Department (HOSPITAL_COMMUNITY): Payer: Medicaid Other

## 2013-10-02 ENCOUNTER — Encounter (HOSPITAL_COMMUNITY): Payer: Self-pay

## 2013-10-02 DIAGNOSIS — K859 Acute pancreatitis without necrosis or infection, unspecified: Secondary | ICD-10-CM | POA: Diagnosis present

## 2013-10-02 DIAGNOSIS — I10 Essential (primary) hypertension: Secondary | ICD-10-CM

## 2013-10-02 DIAGNOSIS — K8020 Calculus of gallbladder without cholecystitis without obstruction: Secondary | ICD-10-CM

## 2013-10-02 DIAGNOSIS — D61818 Other pancytopenia: Secondary | ICD-10-CM | POA: Diagnosis present

## 2013-10-02 DIAGNOSIS — M25569 Pain in unspecified knee: Secondary | ICD-10-CM | POA: Diagnosis present

## 2013-10-02 DIAGNOSIS — Z23 Encounter for immunization: Secondary | ICD-10-CM | POA: Diagnosis not present

## 2013-10-02 DIAGNOSIS — Z79899 Other long term (current) drug therapy: Secondary | ICD-10-CM | POA: Diagnosis not present

## 2013-10-02 DIAGNOSIS — E162 Hypoglycemia, unspecified: Secondary | ICD-10-CM | POA: Diagnosis present

## 2013-10-02 DIAGNOSIS — K8050 Calculus of bile duct without cholangitis or cholecystitis without obstruction: Secondary | ICD-10-CM | POA: Diagnosis present

## 2013-10-02 DIAGNOSIS — R252 Cramp and spasm: Secondary | ICD-10-CM | POA: Diagnosis present

## 2013-10-02 DIAGNOSIS — K851 Biliary acute pancreatitis without necrosis or infection: Secondary | ICD-10-CM | POA: Diagnosis present

## 2013-10-02 DIAGNOSIS — K8062 Calculus of gallbladder and bile duct with acute cholecystitis without obstruction: Secondary | ICD-10-CM | POA: Diagnosis present

## 2013-10-02 DIAGNOSIS — E860 Dehydration: Secondary | ICD-10-CM | POA: Diagnosis present

## 2013-10-02 DIAGNOSIS — G8929 Other chronic pain: Secondary | ICD-10-CM | POA: Diagnosis present

## 2013-10-02 DIAGNOSIS — D509 Iron deficiency anemia, unspecified: Secondary | ICD-10-CM | POA: Diagnosis present

## 2013-10-02 DIAGNOSIS — K219 Gastro-esophageal reflux disease without esophagitis: Secondary | ICD-10-CM | POA: Diagnosis present

## 2013-10-02 LAB — COMPREHENSIVE METABOLIC PANEL
ALT: 26 U/L (ref 0–35)
ANION GAP: 12 (ref 5–15)
AST: 43 U/L — AB (ref 0–37)
Albumin: 3.2 g/dL — ABNORMAL LOW (ref 3.5–5.2)
Alkaline Phosphatase: 96 U/L (ref 39–117)
BILIRUBIN TOTAL: 0.6 mg/dL (ref 0.3–1.2)
BUN: 10 mg/dL (ref 6–23)
CHLORIDE: 102 meq/L (ref 96–112)
CO2: 23 mEq/L (ref 19–32)
CREATININE: 0.83 mg/dL (ref 0.50–1.10)
Calcium: 7.8 mg/dL — ABNORMAL LOW (ref 8.4–10.5)
GFR calc Af Amer: 84 mL/min — ABNORMAL LOW (ref >90)
GFR calc non Af Amer: 72 mL/min — ABNORMAL LOW (ref >90)
Glucose, Bld: 96 mg/dL (ref 70–99)
POTASSIUM: 3.9 meq/L (ref 3.7–5.3)
Sodium: 137 mEq/L (ref 137–147)
TOTAL PROTEIN: 6.7 g/dL (ref 6.0–8.3)

## 2013-10-02 LAB — CBC
HEMATOCRIT: 33.3 % — AB (ref 36.0–46.0)
HEMOGLOBIN: 10.8 g/dL — AB (ref 12.0–15.0)
MCH: 24.8 pg — ABNORMAL LOW (ref 26.0–34.0)
MCHC: 32.4 g/dL (ref 30.0–36.0)
MCV: 76.6 fL — AB (ref 78.0–100.0)
Platelets: 121 10*3/uL — ABNORMAL LOW (ref 150–400)
RBC: 4.35 MIL/uL (ref 3.87–5.11)
RDW: 15.6 % — ABNORMAL HIGH (ref 11.5–15.5)
WBC: 3.9 10*3/uL — AB (ref 4.0–10.5)

## 2013-10-02 LAB — PROTIME-INR
INR: 1.06 (ref 0.00–1.49)
Prothrombin Time: 13.8 seconds (ref 11.6–15.2)

## 2013-10-02 MED ORDER — ENOXAPARIN SODIUM 40 MG/0.4ML ~~LOC~~ SOLN
40.0000 mg | SUBCUTANEOUS | Status: DC
  Filled 2013-10-02: qty 0.4

## 2013-10-02 MED ORDER — IOHEXOL 300 MG/ML  SOLN
25.0000 mL | Freq: Once | INTRAMUSCULAR | Status: AC | PRN
  Administered 2013-10-02: 25 mL via ORAL

## 2013-10-02 MED ORDER — IOHEXOL 300 MG/ML  SOLN
100.0000 mL | Freq: Once | INTRAMUSCULAR | Status: AC | PRN
  Administered 2013-10-02: 100 mL via INTRAVENOUS

## 2013-10-02 MED ORDER — ONDANSETRON HCL 4 MG PO TABS: 4.0000 mg | ORAL_TABLET | Freq: Four times a day (QID) | ORAL | Status: DC | PRN

## 2013-10-02 MED ORDER — ONDANSETRON HCL 4 MG/2ML IJ SOLN
4.0000 mg | Freq: Four times a day (QID) | INTRAMUSCULAR | Status: DC | PRN
  Administered 2013-10-05: 4 mg via INTRAVENOUS
  Filled 2013-10-02: qty 2

## 2013-10-02 MED ORDER — MORPHINE SULFATE 4 MG/ML IJ SOLN
4.0000 mg | Freq: Once | INTRAMUSCULAR | Status: AC
  Administered 2013-10-02: 4 mg via INTRAVENOUS
  Filled 2013-10-02: qty 1

## 2013-10-02 MED ORDER — TRAMADOL HCL 50 MG PO TABS
50.0000 mg | ORAL_TABLET | Freq: Two times a day (BID) | ORAL | Status: DC | PRN
Start: 2013-10-02 — End: 2013-10-05
  Administered 2013-10-03: 50 mg via ORAL
  Filled 2013-10-02: qty 1

## 2013-10-02 MED ORDER — SODIUM CHLORIDE 0.9 % IV SOLN
INTRAVENOUS | Status: DC
  Administered 2013-10-02 (×2): via INTRAVENOUS

## 2013-10-02 MED ORDER — CEFAZOLIN SODIUM-DEXTROSE 2-3 GM-% IV SOLR
2.0000 g | INTRAVENOUS | Status: DC
  Filled 2013-10-02: qty 50

## 2013-10-02 MED ORDER — PNEUMOCOCCAL VAC POLYVALENT 25 MCG/0.5ML IJ INJ
0.5000 mL | INJECTION | INTRAMUSCULAR | Status: AC
Start: 2013-10-03 — End: 2013-10-03
  Administered 2013-10-03: 0.5 mL via INTRAMUSCULAR
  Filled 2013-10-02: qty 0.5

## 2013-10-02 MED ORDER — LISINOPRIL 20 MG PO TABS
20.0000 mg | ORAL_TABLET | Freq: Every day | ORAL | Status: DC
  Administered 2013-10-02 – 2013-10-05 (×4): 20 mg via ORAL
  Filled 2013-10-02 (×4): qty 1

## 2013-10-02 MED ORDER — CHLORHEXIDINE GLUCONATE 4 % EX LIQD
1.0000 "application " | Freq: Once | CUTANEOUS | Status: AC
  Administered 2013-10-03: 1 via TOPICAL
  Filled 2013-10-02: qty 15

## 2013-10-02 MED ORDER — ONDANSETRON HCL 4 MG/2ML IJ SOLN
4.0000 mg | Freq: Once | INTRAMUSCULAR | Status: AC
  Administered 2013-10-02: 4 mg via INTRAVENOUS
  Filled 2013-10-02: qty 2

## 2013-10-02 MED ORDER — FAMOTIDINE 20 MG PO TABS
20.0000 mg | ORAL_TABLET | Freq: Two times a day (BID) | ORAL | Status: DC
  Administered 2013-10-02 – 2013-10-05 (×7): 20 mg via ORAL
  Filled 2013-10-02 (×8): qty 1

## 2013-10-02 MED ORDER — SODIUM CHLORIDE 0.9 % IV BOLUS (SEPSIS)
1000.0000 mL | Freq: Once | INTRAVENOUS | Status: AC
  Administered 2013-10-02: 1000 mL via INTRAVENOUS

## 2013-10-02 MED ORDER — MORPHINE SULFATE 2 MG/ML IJ SOLN
2.0000 mg | INTRAMUSCULAR | Status: DC | PRN
  Administered 2013-10-03 – 2013-10-04 (×4): 2 mg via INTRAVENOUS
  Filled 2013-10-02 (×4): qty 1

## 2013-10-02 NOTE — H&P (Addendum)
[facility] History and Physical  Patient: Michelle Boyd  ZOX:[id number]  DOB: 02/10/1951  DOS: the patient was seen and examined on 10/02/2013 PCP: Holland CommonsKECK, VALERIE, NP  Chief Complaint: Abdominal pain nausea and vomiting  HPI: Michelle Boyd is a 63 y.o. female with Past medical history of hypertension,gall stones. Patient's primary language is [place]ali, phone interpreter was used  The patient presented with abdominal pain that started this evening, she started having episodes of vomiting as well with 2 episodes of vomiting on this and 2 episodes of diarrhea nonbloody. She denies any fever or chills denies any chest pain or shortness of breath denies any dizziness or lightheadedness. Denies any recent change in her medication. She was found to have gallstone as an outpatient was seen by surgery as an outpatient was scheduled for laparoscopic cholecystectomy in future recently. At the time of my evaluation patient's abdominal pain was significantly better she did not have any epigastric abdominal pain her nausea was resolved as well as diarrhea.  The patient is coming from home. And at her baseline independent for most of her ADL.  Review of Systems: as mentioned in the history of present illness.  A Comprehensive review of the other systems is negative.  Past Medical History  Diagnosis Date  . Hypertension   . Chronic knee pain    Past Surgical History  Procedure Laterality Date  . External ear surgery     Social History:  reports that she has never smoked. She does not have any smokeless tobacco history on file. She reports that she does not drink alcohol or use illicit drugs.  No Known Allergies  History reviewed. No pertinent family history.  Prior to Admission medications   Medication Sig Start Date End Date Taking? Authorizing Provider  ergocalciferol (VITAMIN D2) 50000 UNITS capsule Take 50,000 Units by mouth once a week.   Yes Historical Provider, MD  famotidine (PEPCID) 20  MG tablet Take 20 mg by mouth 2 (two) times daily.   Yes Historical Provider, MD  lisinopril-hydrochlorothiazide (PRINZIDE,ZESTORETIC) 20-25 MG per tablet Take 1 tablet by mouth daily.   Yes Historical Provider, MD  oxyCODONE-acetaminophen (PERCOCET/ROXICET) 5-325 MG per tablet Take 1 tablet by mouth every 6 (six) hours as needed for moderate pain or severe pain.   Yes Historical Provider, MD  traMADol (ULTRAM) 50 MG tablet Take 50 mg by mouth 2 (two) times daily as needed for moderate pain.   Yes Historical Provider, MD    Physical Exam: Filed Vitals:   10/02/13 0151 10/02/13 0200 10/02/13 0230 10/02/13 0300  BP:  148/76 130/75 148/76  Pulse: 85 81 72 71  Temp:      TempSrc:      Resp: 18 16 18 21   SpO2: 100% 99% 98% 100%    General: Alert, Awake and Oriented to Time, Place and Person. Appear in mild distress Eyes: PERRL ENT: Oral Mucosa clear moist. Neck: no JVD Cardiovascular: S1 and S2 Present, no Murmur, Peripheral Pulses Present Respiratory: Bilateral Air entry equal and Decreased, Clear to Auscultation, noCrackles, no wheezes Abdomen: Bowel Sound Present, Soft and mild RUQ tender Skin: no Rash Extremities: no Pedal edema, no calf tenderness Neurologic: Grossly no focal neuro deficit.  Labs on Admission:  CBC:  Recent Labs Lab 10/01/13 1850  WBC 3.8*  NEUTROABS 1.4*  HGB 11.4*  HCT 34.5*  MCV 76.0*  PLT 139*    CMP     Component Value Date/Time   NA 134* 10/01/2013 1850   K 4.0  10/01/2013 1850   CL 98 10/01/2013 1850   CO2 22 10/01/2013 1850   GLUCOSE 92 10/01/2013 1850   BUN 13 10/01/2013 1850   CREATININE 0.88 10/01/2013 1850   CALCIUM 9.1 10/01/2013 1850   PROT 7.7 10/01/2013 1850   ALBUMIN 3.6 10/01/2013 1850   AST 31 10/01/2013 1850   ALT 25 10/01/2013 1850   ALKPHOS 107 10/01/2013 1850   BILITOT 0.4 10/01/2013 1850   GFRNONAA 67* 10/01/2013 1850   GFRAA 78* 10/01/2013 1850     Recent Labs Lab 10/01/13 1850  LIPASE 115*   No results found for this  basename: AMMONIA,  in the last 168 hours  No results found for this basename: CKTOTAL, CKMB, CKMBINDEX, TROPONINI,  in the last 168 hours BNP (last 3 results) No results found for this basename: PROBNP,  in the last 8760 hours  Radiological Exams on Admission: Ct Abdomen Pelvis W Contrast  10/02/2013   CLINICAL DATA:  Right lower quadrant abdominal pain. Elevated lipase.  EXAM: CT ABDOMEN AND PELVIS WITH CONTRAST  TECHNIQUE: Multidetector CT imaging of the abdomen and pelvis was performed using the standard protocol following bolus administration of intravenous contrast.  CONTRAST:  25mL OMNIPAQUE IOHEXOL 300 MG/ML SOLN, OMNIPAQUE IOHEXOL 300 MG/ML SOLN  COMPARISON:  Abdominal ultrasound performed 08/31/2013  FINDINGS: Minimal bibasilar atelectasis is noted.  There is mild gallbladder wall thickening and question of trace pericholecystic fluid. The common hepatic duct is dilated to 1.0 cm in maximum diameter, and there is mild prominence of the intrahepatic biliary ducts. This raises concern for distal obstruction, though no definite mass or stone is characterized on this study.  The liver is otherwise unremarkable in appearance. The spleen is within normal limits. The pancreas is otherwise grossly unremarkable. The adrenal glands are normal in appearance.  The kidneys are unremarkable in appearance. There is no evidence of hydronephrosis. No renal or ureteral stones are seen. No perinephric stranding is appreciated.  No free fluid is identified. The small bowel is unremarkable in appearance. The stomach is within normal limits. No acute vascular abnormalities are seen.  The appendix is normal in caliber, without evidence for appendicitis. The colon is unremarkable in appearance.  The bladder is moderately distended and grossly unremarkable. The uterus is grossly unremarkable. The ovaries are relatively symmetric. No suspicious adnexal masses are seen. No inguinal lymphadenopathy is seen.  No acute  osseous abnormalities are identified. There is mild chronic loss of height at L3.  IMPRESSION: 1. Dilatation of the common hepatic duct to 1.0 cm, with mild prominence of the intrahepatic biliary ducts. This raises concern for distal obstruction, though no definite mass or stone is characterized on this study. MRCP or ERCP would be helpful for further evaluation, when and as deemed clinically appropriate. 2. Mild gallbladder wall thickening and question of trace pericholecystic fluid. This could reflect the obstruction described above, though mild cholecystitis cannot be entirely excluded.   Electronically Signed   By: Roanna Raider M.D.   On: 10/02/2013 02:26    Assessment/Plan Principal Problem:   Biliary colic Active Problems:   Hypertension   Gallstones   1. Biliary colic The patient is presenting with complaints of abdominal pain, nausea, vomiting or diarrhea. She was found to have dilated CBD with mild elevation of lipase. CT of the abdomen does not show any evidence there is evidence of pancreatitis. Symptoms at present improving. We will follow general surgery recommendation, currently on keeping the patient n.p.o., I will give her IV  fluids IV Zofran. IV pain medication.   2. hypertension. At present continue patient's lisinopril holding diuretics.  Consults: gen surgery  DVT Prophylaxis: subcutaneous Heparin Nutrition: npo  Code Status: full  Disposition: Admitted to inpatient in med-surge unit.  Author: Lynden Oxford, MD [facility] Pager: [phone] 10/02/2013, 4:58 AM    If 7PM-7AM, please contact night-coverage [email or web address] Password [email or web address]  **Disclaimer: This note may have been dictated with voice recognition software. Similar sounding words can inadvertently be transcribed and this note may contain transcription errors which may not have been corrected upon publication of note.**

## 2013-10-02 NOTE — Progress Notes (Addendum)
[facility] PROGRESS NOTE  Shonika Haskel Schroeder GDJ:[id number] DOB: 1950-03-13 DOA: 10/01/2013 PCP: Chari Manning, NP  Assessment/Plan  Probable gallstone pancreatitis with dilation of biliary tree on CT and gallstones in gallbladder, but bilirubin/alk phos normal suggesting stone has recently passed or only partial obstruction -  General surgery consultation -  Continue to trend LFTs and if rising, GI consult for pre-op ERCP followed by lap chole -  If LFTs remain stable, lap-chole with intraoperative cholangiogram -  Continue IVF and antiemetics  Hypertension, blood pressure elevated,  -  Continue lisinopril  -  Resume diuretic once clinically improving  Leukopenia, mild, ANC 1.4 -  HIV  Microcytic anemia -  Occult stool -  Consider parasitic infection -  Iron studies, b12, folate, TSH  Thrombocytopenia, likely acute phase reactant -  Trend plt  Globulin gap -  HIV, HCV, SPEP/UPEP/IFE  Diet:  NPO Access:  PIV IVF:  yes Proph:  Refusing lovenox, add SCDs  Code Status: full Family Communication: patient alone via [place] interpreter Disposition Plan: pending lap chole   Consultants:  Gen surg  Procedures:  CT abd/pelvis  Antibiotics:  none   HPI/Subjective:  States she feels much better today.  Denies nausea, vomiting, but still having abdominal pain   Objective: Filed Vitals:   10/02/13 0502 10/02/13 0503 10/02/13 0527 10/02/13 0929  BP:  162/70  149/59  Pulse:  71  77  Temp:  98.4 F (36.9 C)  97.5 F (36.4 C)  TempSrc:  Oral  Oral  Resp:  20  20  Height:   _0  (1.499 m)   Weight: 60.7 kg (133 lb 13.1 oz) 60.374 kg (133 lb 1.6 oz)    SpO2:  100%  96%    Intake/Output Summary (Last 24 hours) at 10/02/13 1106 Last data filed at 10/02/13 6222  Gross per 24 hour  Intake   1000 ml  Output    400 ml  Net    600 ml   Filed Weights   10/02/13 0502 10/02/13 0503  Weight: 60.7 kg (133 lb 13.1 oz) 60.374 kg (133 lb 1.6 oz)     Exam:   General:  Adult F, No acute distress  HEENT:  NCAT, MMM  Cardiovascular:  RRR, nl S1, S2 1/6 systolic murmur LSB, 2+ pulses, warm extremities  Respiratory:  CTAB, no increased WOB  Abdomen:   NABS, soft, TTP in epigastrium without rebound or guarding  MSK:   Normal tone and bulk, no LEE  Neuro:  Grossly intact  Data Reviewed: Basic Metabolic Panel:  Recent Labs Lab 10/01/13 1850 10/02/13 0610  NA 134* 137  K 4.0 3.9  CL 98 102  CO2 22 23  GLUCOSE 92 96  BUN 13 10  CREATININE 0.88 0.83  CALCIUM 9.1 7.8*   Liver Function Tests:  Recent Labs Lab 10/01/13 1850 10/02/13 0610  AST 31 43*  ALT 25 26  ALKPHOS 107 96  BILITOT 0.4 0.6  PROT 7.7 6.7  ALBUMIN 3.6 3.2*    Recent Labs Lab 10/01/13 1850  LIPASE 115*   No results found for this basename: AMMONIA,  in the last 168 hours CBC:  Recent Labs Lab 10/01/13 1850 10/02/13 0610  WBC 3.8* 3.9*  NEUTROABS 1.4*  --   HGB 11.4* 10.8*  HCT 34.5* 33.3*  MCV 76.0* 76.6*  PLT 139* 121*   Cardiac Enzymes: No results found for this basename: CKTOTAL, CKMB, CKMBINDEX, TROPONINI,  in the last 168 hours BNP (last 3 results) No  results found for this basename: PROBNP,  in the last 8760 hours CBG: No results found for this basename: GLUCAP,  in the last 168 hours  No results found for this or any previous visit (from the past 240 hour(s)).   Studies: Ct Abdomen Pelvis W Contrast  10/02/2013   CLINICAL DATA:  Right lower quadrant abdominal pain. Elevated lipase.  EXAM: CT ABDOMEN AND PELVIS WITH CONTRAST  TECHNIQUE: Multidetector CT imaging of the abdomen and pelvis was performed using the standard protocol following bolus administration of intravenous contrast.  CONTRAST:  50m OMNIPAQUE IOHEXOL 300 MG/ML SOLN, 1083mOMNIPAQUE IOHEXOL 300 MG/ML SOLN  COMPARISON:  Abdominal ultrasound performed 08/31/2013  FINDINGS: Minimal bibasilar atelectasis is noted.  There is mild gallbladder wall thickening and  question of trace pericholecystic fluid. The common hepatic duct is dilated to 1.0 cm in maximum diameter, and there is mild prominence of the intrahepatic biliary ducts. This raises concern for distal obstruction, though no definite mass or stone is characterized on this study.  The liver is otherwise unremarkable in appearance. The spleen is within normal limits. The pancreas is otherwise grossly unremarkable. The adrenal glands are normal in appearance.  The kidneys are unremarkable in appearance. There is no evidence of hydronephrosis. No renal or ureteral stones are seen. No perinephric stranding is appreciated.  No free fluid is identified. The small bowel is unremarkable in appearance. The stomach is within normal limits. No acute vascular abnormalities are seen.  The appendix is normal in caliber, without evidence for appendicitis. The colon is unremarkable in appearance.  The bladder is moderately distended and grossly unremarkable. The uterus is grossly unremarkable. The ovaries are relatively symmetric. No suspicious adnexal masses are seen. No inguinal lymphadenopathy is seen.  No acute osseous abnormalities are identified. There is mild chronic loss of height at L3.  IMPRESSION: 1. Dilatation of the common hepatic duct to 1.0 cm, with mild prominence of the intrahepatic biliary ducts. This raises concern for distal obstruction, though no definite mass or stone is characterized on this study. MRCP or ERCP would be helpful for further evaluation, when and as deemed clinically appropriate. 2. Mild gallbladder wall thickening and question of trace pericholecystic fluid. This could reflect the obstruction described above, though mild cholecystitis cannot be entirely excluded.   Electronically Signed   By: JeGarald Balding.D.   On: 10/02/2013 02:26    Scheduled Meds: . enoxaparin (LOVENOX) injection  40 mg Subcutaneous Q24H  . famotidine  20 mg Oral BID  . lisinopril  20 mg Oral Daily  . [START ON  10/03/2013] pneumococcal 23 valent vaccine  0.5 mL Intramuscular Tomorrow-1000   Continuous Infusions: . sodium chloride 75 mL/hr at 10/02/13 062536  Principal Problem:   Biliary colic Active Problems:   Hypertension   Gallstones    Time spent: 30 min    Amauris Debois, [facility] Pager [phone] 7PM-7AM, please contact night-coverage at [email or web address], password [email or web address], 11:06 AM  LOS: 1 day

## 2013-10-02 NOTE — Consult Note (Signed)
[facility] Consult Note  Michelle Boyd 03-May-1950  [id number].    Requesting MD: Dr. Sheran Fava Chief Complaint/Reason for Consult: RUQ abdominal pain  HPI:  63 y/o [place] (some english speaking) female with PMH GERD & HTN presented to [facility] from home with complaint of right-sided abdominal pain, N/V/D. Patient has history of known gallstones. She was seen by Dr. Dalbert Batman on Tuesday, and was planning cholecystectomy in the near future.  Patient daughter reports that she was well when she left for work this morning, but upon returning home this evening, patient has been complaining of anorexia, V/D throughout the day with worsening of her right-sided abdominal pain.  No radiating pain, no alleviating or aggravating factors.  Patient has been taking her Percocet as prescribed, but does complain that it makes her dizzy when she takes it. No fevers or chills. Pain is right sided in mid abdomen consistent with pain that she has had previously with gallstones.  Pain has been going on for 6 weeks on and off.  Ultrasound shows 2cm gallstone without inflammation.  H/o right mastoidectomy.   ROS: All systems reviewed and otherwise negative except for as above  History reviewed. No pertinent family history.  Past Medical History  Diagnosis Date  . Hypertension   . Chronic knee pain     Past Surgical History  Procedure Laterality Date  . External ear surgery      Social History:  reports that she has never smoked. She does not have any smokeless tobacco history on file. She reports that she does not drink alcohol or use illicit drugs.  Allergies: No Known Allergies  Medications Prior to Admission  Medication Sig Dispense Refill  . ergocalciferol (VITAMIN D2) 50000 UNITS capsule Take 50,000 Units by mouth once a week.      . famotidine (PEPCID) 20 MG tablet Take 20 mg by mouth 2 (two) times daily.      Marland Kitchen lisinopril-hydrochlorothiazide (PRINZIDE,ZESTORETIC) 20-25 MG per tablet Take 1 tablet  by mouth daily.      Marland Kitchen oxyCODONE-acetaminophen (PERCOCET/ROXICET) 5-325 MG per tablet Take 1 tablet by mouth every 6 (six) hours as needed for moderate pain or severe pain.      . traMADol (ULTRAM) 50 MG tablet Take 50 mg by mouth 2 (two) times daily as needed for moderate pain.        Blood pressure 162/70, pulse 71, temperature 98.4 F (36.9 C), temperature source Oral, resp. rate 20, height 4' 11"  (1.499 m), weight 133 lb 1.6 oz (60.374 kg), SpO2 100.00%. Physical Exam: General: pleasant, WD/WN [place] female who is laying in bed in NAD HEENT: head is normocephalic, atraumatic.  Sclera are noninjected.  PERRL.  Ears and nose without any masses or lesions.  Mouth is pink and moist Heart: regular, rate, and rhythm.  No obvious murmurs, gallops, or rubs noted.  Palpable pedal pulses bilaterally Lungs: CTAB, no wheezes, rhonchi, or rales noted.  Respiratory effort nonlabored Abd: soft, mild tenderness in RUQ, ND, +BS, no masses, hernias, or organomegaly MS: all 4 extremities are symmetrical with no cyanosis, clubbing, or edema. Skin: warm and dry with no masses, lesions, or rashes Psych: A&Ox3 with an appropriate affect.   Results for orders placed during the hospital encounter of 10/01/13 (from the past 48 hour(s))  COMPREHENSIVE METABOLIC PANEL     Status: Abnormal   Collection Time    10/01/13  6:50 PM      Result Value Ref Range   Sodium 134 (*) 137 -  147 mEq/L   Potassium 4.0  3.7 - 5.3 mEq/L   Chloride 98  96 - 112 mEq/L   CO2 22  19 - 32 mEq/L   Glucose, Bld 92  70 - 99 mg/dL   BUN 13  6 - 23 mg/dL   Creatinine, Ser 0.88  0.50 - 1.10 mg/dL   Calcium 9.1  8.4 - 10.5 mg/dL   Total Protein 7.7  6.0 - 8.3 g/dL   Albumin 3.6  3.5 - 5.2 g/dL   AST 31  0 - 37 U/L   ALT 25  0 - 35 U/L   Alkaline Phosphatase 107  39 - 117 U/L   Total Bilirubin 0.4  0.3 - 1.2 mg/dL   GFR calc non Af Amer 67 (*) >90 mL/min   GFR calc Af Amer 78 (*) >90 mL/min   Comment: (NOTE)     The eGFR has  been calculated using the CKD EPI equation.     This calculation has not been validated in all clinical situations.     eGFR's persistently <90 mL/min signify possible Chronic Kidney     Disease.   Anion gap 14  5 - 15  CBC WITH DIFFERENTIAL     Status: Abnormal   Collection Time    10/01/13  6:50 PM      Result Value Ref Range   WBC 3.8 (*) 4.0 - 10.5 K/uL   RBC 4.54  3.87 - 5.11 MIL/uL   Hemoglobin 11.4 (*) 12.0 - 15.0 g/dL   HCT 34.5 (*) 36.0 - 46.0 %   MCV 76.0 (*) 78.0 - 100.0 fL   MCH 25.1 (*) 26.0 - 34.0 pg   MCHC 33.0  30.0 - 36.0 g/dL   RDW 15.7 (*) 11.5 - 15.5 %   Platelets 139 (*) 150 - 400 K/uL   Neutrophils Relative % 38 (*) 43 - 77 %   Neutro Abs 1.4 (*) 1.7 - 7.7 K/uL   Lymphocytes Relative 47 (*) 12 - 46 %   Lymphs Abs 1.7  0.7 - 4.0 K/uL   Monocytes Relative 13 (*) 3 - 12 %   Monocytes Absolute 0.5  0.1 - 1.0 K/uL   Eosinophils Relative 2  0 - 5 %   Eosinophils Absolute 0.1  0.0 - 0.7 K/uL   Basophils Relative 0  0 - 1 %   Basophils Absolute 0.0  0.0 - 0.1 K/uL  LIPASE, BLOOD     Status: Abnormal   Collection Time    10/01/13  6:50 PM      Result Value Ref Range   Lipase 115 (*) 11 - 59 U/L  COMPREHENSIVE METABOLIC PANEL     Status: Abnormal   Collection Time    10/02/13  6:10 AM      Result Value Ref Range   Sodium 137  137 - 147 mEq/L   Potassium 3.9  3.7 - 5.3 mEq/L   Chloride 102  96 - 112 mEq/L   CO2 23  19 - 32 mEq/L   Glucose, Bld 96  70 - 99 mg/dL   BUN 10  6 - 23 mg/dL   Creatinine, Ser 0.83  0.50 - 1.10 mg/dL   Calcium 7.8 (*) 8.4 - 10.5 mg/dL   Total Protein 6.7  6.0 - 8.3 g/dL   Albumin 3.2 (*) 3.5 - 5.2 g/dL   AST 43 (*) 0 - 37 U/L   ALT 26  0 - 35 U/L   Alkaline Phosphatase 96  39 - 117 U/L   Total Bilirubin 0.6  0.3 - 1.2 mg/dL   GFR calc non Af Amer 72 (*) >90 mL/min   GFR calc Af Amer 84 (*) >90 mL/min   Comment: (NOTE)     The eGFR has been calculated using the CKD EPI equation.     This calculation has not been validated in  all clinical situations.     eGFR's persistently <90 mL/min signify possible Chronic Kidney     Disease.   Anion gap 12  5 - 15  CBC     Status: Abnormal   Collection Time    10/02/13  6:10 AM      Result Value Ref Range   WBC 3.9 (*) 4.0 - 10.5 K/uL   RBC 4.35  3.87 - 5.11 MIL/uL   Hemoglobin 10.8 (*) 12.0 - 15.0 g/dL   HCT 33.3 (*) 36.0 - 46.0 %   MCV 76.6 (*) 78.0 - 100.0 fL   MCH 24.8 (*) 26.0 - 34.0 pg   MCHC 32.4  30.0 - 36.0 g/dL   RDW 15.6 (*) 11.5 - 15.5 %   Platelets 121 (*) 150 - 400 K/uL  PROTIME-INR     Status: None   Collection Time    10/02/13  6:10 AM      Result Value Ref Range   Prothrombin Time 13.8  11.6 - 15.2 seconds   INR 1.06  0.00 - 1.49   Ct Abdomen Pelvis W Contrast  10/02/2013   CLINICAL DATA:  Right lower quadrant abdominal pain. Elevated lipase.  EXAM: CT ABDOMEN AND PELVIS WITH CONTRAST  TECHNIQUE: Multidetector CT imaging of the abdomen and pelvis was performed using the standard protocol following bolus administration of intravenous contrast.  CONTRAST:  70m OMNIPAQUE IOHEXOL 300 MG/ML SOLN, 1073mOMNIPAQUE IOHEXOL 300 MG/ML SOLN  COMPARISON:  Abdominal ultrasound performed 08/31/2013  FINDINGS: Minimal bibasilar atelectasis is noted.  There is mild gallbladder wall thickening and question of trace pericholecystic fluid. The common hepatic duct is dilated to 1.0 cm in maximum diameter, and there is mild prominence of the intrahepatic biliary ducts. This raises concern for distal obstruction, though no definite mass or stone is characterized on this study.  The liver is otherwise unremarkable in appearance. The spleen is within normal limits. The pancreas is otherwise grossly unremarkable. The adrenal glands are normal in appearance.  The kidneys are unremarkable in appearance. There is no evidence of hydronephrosis. No renal or ureteral stones are seen. No perinephric stranding is appreciated.  No free fluid is identified. The small bowel is unremarkable in  appearance. The stomach is within normal limits. No acute vascular abnormalities are seen.  The appendix is normal in caliber, without evidence for appendicitis. The colon is unremarkable in appearance.  The bladder is moderately distended and grossly unremarkable. The uterus is grossly unremarkable. The ovaries are relatively symmetric. No suspicious adnexal masses are seen. No inguinal lymphadenopathy is seen.  No acute osseous abnormalities are identified. There is mild chronic loss of height at L3.  IMPRESSION: 1. Dilatation of the common hepatic duct to 1.0 cm, with mild prominence of the intrahepatic biliary ducts. This raises concern for distal obstruction, though no definite mass or stone is characterized on this study. MRCP or ERCP would be helpful for further evaluation, when and as deemed clinically appropriate. 2. Mild gallbladder wall thickening and question of trace pericholecystic fluid. This could reflect the obstruction described above, though mild cholecystitis cannot be entirely excluded.  Electronically Signed   By: Garald Balding M.D.   On: 10/02/2013 02:26      Assessment/Plan Gallstone pancreatitis Cholelithiasis Biliary colic ? cholecystitis Dilatation of the common hepatic duct to 1.0 cm Mild prominence of the intrahepatic biliary ducts ?obstruction  Plan: 1.  Will need lap chole this admission, but will await improvement in pain and lipase prior to proceeding with OR (Sat/Sun) 2.  NPO, bowel rest, IVF, pain control, antiemetics, no antibiotics needed 3.  SCD's and lovenox for DVT proph (hold after midnight) 4.  Ambulate and IS 5.  Repeat labs in am (currently only mild elevating in AST and lipase) to see if she may need MRCP/ERCP given CHD and IHD dilitation   Coralie Keens, [facility] 10/02/2013, 9:03 AM Pager: [phone]

## 2013-10-02 NOTE — ED Notes (Signed)
MD at bedisde

## 2013-10-02 NOTE — ED Provider Notes (Signed)
CSN: [id number]     Arrival date & time 10/01/13  [id number] History   First MD Initiated Contact with Patient 10/01/13 2300     Chief Complaint  Patient presents with  . Abdominal Pain     (Consider location/radiation/quality/duration/timing/severity/associated sxs/prior Treatment) HPI -year-old female presents to the emergency room from home with complaint of right-sided abdominal pain, nausea vomiting and diarrhea.  Patient has history of known gallstones.  She was seen on Tuesday at the surgery clinic, and per daughter is being scheduled for a cholecystectomy in the near future.  Patient daughter reports that she was well when she left for work this morning, but upon returning home this evening, patient has been complaining of vomiting and diarrhea throughout the day with worsening of her right-sided abdominal pain.  Patient has been taking her Percocet as prescribed, but does complain that it makes her dizzy when she takes it.  No fevers or chills.  Pain is right sided in mid abdomen consistent with pain that she has had previously with gallstones. Past Medical History  Diagnosis Date  . Hypertension   . Chronic knee pain    Past Surgical History  Procedure Laterality Date  . External ear surgery     History reviewed. No pertinent family history. History  Substance Use Topics  . Smoking status: Never Smoker   . Smokeless tobacco: Not on file  . Alcohol Use: No   OB History   Grav Para Term Preterm Abortions TAB SAB Ect Mult Living                 Review of Systems  Review of systems Limited secondary to language barrier  Allergies  Review of patient's allergies indicates no known allergies.  Home Medications   Prior to Admission medications   Medication Sig Start Date End Date Taking? Authorizing Provider  ergocalciferol (VITAMIN D2) 50000 UNITS capsule Take 1 capsule (50,000 Units total) by mouth once a week. 08/06/13   Ambrose FinlandValerie A Keck, NP  famotidine (PEPCID) 20 MG tablet  Take 1 tablet (20 mg total) by mouth 2 (two) times daily. 09/05/13   Junius FinnerErin O'Malley, PA-C  ibuprofen (ADVIL,MOTRIN) 600 MG tablet Take 1 tablet (600 mg total) by mouth every 6 (six) hours as needed for pain. 11/07/12   Ripudeep Jenna LuoK Rai, MD  lisinopril-hydrochlorothiazide (PRINZIDE,ZESTORETIC) 20-25 MG per tablet Take 1 tablet by mouth daily. 09/11/13   Ambrose FinlandValerie A Keck, NP  ondansetron (ZOFRAN ODT) 8 MG disintegrating tablet Take 1 tablet (8 mg total) by mouth every 8 (eight) hours as needed for nausea. 08/31/13   Derwood KaplanAnkit Nanavati, MD  oxyCODONE-acetaminophen (PERCOCET/ROXICET) 5-325 MG per tablet Take 1-2 tablets by mouth every 6 (six) hours as needed for moderate pain or severe pain. 09/05/13   Junius FinnerErin O'Malley, PA-C  traMADol (ULTRAM) 50 MG tablet Take 1 tablet (50 mg total) by mouth 2 (two) times daily. 08/05/13   Ambrose FinlandValerie A Keck, NP   BP 157/76  Pulse 71  Temp(Src) 98.2 F (36.8 C) (Oral)  Resp 20  SpO2 99% Physical Exam  Nursing note and vitals reviewed. Constitutional: She is oriented to person, place, and time. She appears well-developed and well-nourished. No distress.  HENT:  Head: Normocephalic and atraumatic.  Nose: Nose normal.  Mouth/Throat: Oropharynx is clear and moist.  Eyes: Conjunctivae and EOM are normal. Pupils are equal, round, and reactive to light.  Neck: Normal range of motion. Neck supple. No JVD present. No tracheal deviation present. No thyromegaly present.  Cardiovascular: Normal  rate, regular rhythm, normal heart sounds and intact distal pulses.  Exam reveals no gallop and no friction rub.   No murmur heard. Pulmonary/Chest: Effort normal and breath sounds normal. No stridor. No respiratory distress. She has no wheezes. She has no rales. She exhibits no tenderness.  Abdominal: Soft. Bowel sounds are normal. She exhibits no distension and no mass. There is tenderness (the patient has tenderness to right mid abdomen, also some tenderness to right upper quadrant and epigastrium.   No pain in right lower quadrant suprapubic or left side of abdomen.). There is no rebound and no guarding.  Musculoskeletal: Normal range of motion. She exhibits no edema and no tenderness.  Lymphadenopathy:    She has no cervical adenopathy.  Neurological: She is alert and oriented to person, place, and time. She exhibits normal muscle tone. Coordination normal.  Skin: Skin is warm and dry. No rash noted. No erythema. No pallor.  Psychiatric: She has a normal mood and affect. Her behavior is normal. Judgment and thought content normal.    ED Course  Procedures (including critical care time) Labs Review Labs Reviewed  COMPREHENSIVE METABOLIC PANEL - Abnormal; Notable for the following:    Sodium 134 (*)    GFR calc non Af Amer 67 (*)    GFR calc Af Amer 78 (*)    All other components within normal limits  CBC WITH DIFFERENTIAL - Abnormal; Notable for the following:    WBC 3.8 (*)    Hemoglobin 11.4 (*)    HCT 34.5 (*)    MCV 76.0 (*)    MCH 25.1 (*)    RDW 15.7 (*)    Platelets 139 (*)    Neutrophils Relative % 38 (*)    Neutro Abs 1.4 (*)    Lymphocytes Relative 47 (*)    Monocytes Relative 13 (*)    All other components within normal limits  LIPASE, BLOOD - Abnormal; Notable for the following:    Lipase 115 (*)    All other components within normal limits    Imaging Review Ct Abdomen Pelvis W Contrast  10/02/2013   CLINICAL DATA:  Right lower quadrant abdominal pain. Elevated lipase.  EXAM: CT ABDOMEN AND PELVIS WITH CONTRAST  TECHNIQUE: Multidetector CT imaging of the abdomen and pelvis was performed using the standard protocol following bolus administration of intravenous contrast.  CONTRAST:  25mL OMNIPAQUE IOHEXOL 300 MG/ML SOLN, OMNIPAQUE IOHEXOL 300 MG/ML SOLN  COMPARISON:  Abdominal ultrasound performed 08/31/2013  FINDINGS: Minimal bibasilar atelectasis is noted.  There is mild gallbladder wall thickening and question of trace pericholecystic fluid. The common  hepatic duct is dilated to 1.0 cm in maximum diameter, and there is mild prominence of the intrahepatic biliary ducts. This raises concern for distal obstruction, though no definite mass or stone is characterized on this study.  The liver is otherwise unremarkable in appearance. The spleen is within normal limits. The pancreas is otherwise grossly unremarkable. The adrenal glands are normal in appearance.  The kidneys are unremarkable in appearance. There is no evidence of hydronephrosis. No renal or ureteral stones are seen. No perinephric stranding is appreciated.  No free fluid is identified. The small bowel is unremarkable in appearance. The stomach is within normal limits. No acute vascular abnormalities are seen.  The appendix is normal in caliber, without evidence for appendicitis. The colon is unremarkable in appearance.  The bladder is moderately distended and grossly unremarkable. The uterus is grossly unremarkable. The ovaries are relatively symmetric. No  suspicious adnexal masses are seen. No inguinal lymphadenopathy is seen.  No acute osseous abnormalities are identified. There is mild chronic loss of height at L3.  IMPRESSION: 1. Dilatation of the common hepatic duct to 1.0 cm, with mild prominence of the intrahepatic biliary ducts. This raises concern for distal obstruction, though no definite mass or stone is characterized on this study. MRCP or ERCP would be helpful for further evaluation, when and as deemed clinically appropriate. 2. Mild gallbladder wall thickening and question of trace pericholecystic fluid. This could reflect the obstruction described above, though mild cholecystitis cannot be entirely excluded.   Electronically Signed   By: Roanna Raider M.D.   On: 10/02/2013 02:26     EKG Interpretation None      MDM   Final diagnoses:  Gallstone pancreatitis    63 year old female with known cholelithiasis who presents with nausea vomiting diarrhea and worsening abdominal pain.   Labs show mild neutropenia.  She has elevated lipase.  This is concerning for gallstone pancreatitis.  Plan for IV hydration, pain and nausea medicine, and CT abdomen pelvis to further evaluate her right mid abdomen pain and pancreatitis.  Case discussed with general surgery.  They will follow along with medicine.  Case discussed with hospitalist who will admit.  Patient and family updated on findings and plan.   Olivia Mackie, MD 10/02/13 [phone]

## 2013-10-02 NOTE — ED Notes (Signed)
MD at bedside. 

## 2013-10-02 NOTE — Progress Notes (Signed)
Informed consent signed for cholecystectomy w/ cholangiogram using [employer] ID # [id number].  All questions answered.  Patient stated: "I am ready to have my gallbladder removed so I can feel better".

## 2013-10-02 NOTE — Progress Notes (Signed)
Attempted to call report,ED RN unable to give report at this time,she will call back. [name], Drinda Buttsharito [name], RCharity fundraiser

## 2013-10-02 NOTE — Progress Notes (Signed)
Patient admitted to [id number]. Alert and oriented. Obtained Health history and information via interpreter. Patient acknowledged and agrees via interpreter for son and daughter to receive health information regarding her care. Skin intact. No c/o at this time via interpreter. Patient resting in bed and initiated with fall precautions. Tommy Rainwaterang, Kenndra Morris V, RN

## 2013-10-02 NOTE — Consult Note (Signed)
Not very significant pancreatitis, will check lfts and lipase in am, cbd dilated but with nl lfts and if stay nl can proceed with lap chole likely tomorrow.

## 2013-10-03 DIAGNOSIS — D61818 Other pancytopenia: Secondary | ICD-10-CM

## 2013-10-03 DIAGNOSIS — R252 Cramp and spasm: Secondary | ICD-10-CM

## 2013-10-03 LAB — CBC
HEMATOCRIT: 33.3 % — AB (ref 36.0–46.0)
Hemoglobin: 10.5 g/dL — ABNORMAL LOW (ref 12.0–15.0)
MCH: 24.5 pg — ABNORMAL LOW (ref 26.0–34.0)
MCHC: 31.5 g/dL (ref 30.0–36.0)
MCV: 77.6 fL — ABNORMAL LOW (ref 78.0–100.0)
PLATELETS: 124 10*3/uL — AB (ref 150–400)
RBC: 4.29 MIL/uL (ref 3.87–5.11)
RDW: 16.2 % — AB (ref 11.5–15.5)
WBC: 2.9 10*3/uL — AB (ref 4.0–10.5)

## 2013-10-03 LAB — COMPREHENSIVE METABOLIC PANEL
ALT: 23 U/L (ref 0–35)
AST: 27 U/L (ref 0–37)
Albumin: 3 g/dL — ABNORMAL LOW (ref 3.5–5.2)
Alkaline Phosphatase: 95 U/L (ref 39–117)
Anion gap: 12 (ref 5–15)
BUN: 10 mg/dL (ref 6–23)
CALCIUM: 8.4 mg/dL (ref 8.4–10.5)
CO2: 21 meq/L (ref 19–32)
CREATININE: 0.78 mg/dL (ref 0.50–1.10)
Chloride: 108 mEq/L (ref 96–112)
GFR calc Af Amer: >90 mL/min (ref >90)
GFR calc non Af Amer: 86 mL/min — ABNORMAL LOW (ref >90)
Glucose, Bld: 67 mg/dL — ABNORMAL LOW (ref 70–99)
Potassium: 4 mEq/L (ref 3.7–5.3)
SODIUM: 141 meq/L (ref 137–147)
TOTAL PROTEIN: 6.5 g/dL (ref 6.0–8.3)
Total Bilirubin: 0.6 mg/dL (ref 0.3–1.2)

## 2013-10-03 LAB — SURGICAL PCR SCREEN
MRSA, PCR: NEGATIVE
Staphylococcus aureus: NEGATIVE

## 2013-10-03 LAB — VITAMIN B12: Vitamin B-12: 342 pg/mL (ref 211–911)

## 2013-10-03 LAB — HIV ANTIBODY (ROUTINE TESTING W REFLEX): HIV 1&2 Ab, 4th Generation: NONREACTIVE

## 2013-10-03 LAB — FOLATE RBC: RBC Folate: 750 ng/mL — ABNORMAL HIGH (ref >280)

## 2013-10-03 LAB — HEPATITIS C ANTIBODY: HCV AB: NEGATIVE

## 2013-10-03 LAB — TSH: TSH: 0.567 u[IU]/mL (ref 0.350–4.500)

## 2013-10-03 LAB — IRON AND TIBC
Iron: 38 ug/dL — ABNORMAL LOW (ref 42–135)
SATURATION RATIOS: 15 % — AB (ref 20–55)
TIBC: 259 ug/dL (ref 250–470)
UIBC: 221 ug/dL (ref 125–400)

## 2013-10-03 LAB — LIPASE, BLOOD: LIPASE: 24 U/L (ref 11–59)

## 2013-10-03 LAB — FERRITIN: FERRITIN: 93 ng/mL (ref 10–291)

## 2013-10-03 LAB — MAGNESIUM: Magnesium: 1.8 mg/dL (ref 1.5–2.5)

## 2013-10-03 MED ORDER — KCL IN DEXTROSE-NACL 10-5-0.45 MEQ/L-%-% IV SOLN
INTRAVENOUS | Status: DC
  Administered 2013-10-03 – 2013-10-05 (×3): via INTRAVENOUS
  Filled 2013-10-03 (×5): qty 1000

## 2013-10-03 MED ORDER — BOOST / RESOURCE BREEZE PO LIQD
1.0000 | Freq: Three times a day (TID) | ORAL | Status: DC
  Administered 2013-10-03 – 2013-10-05 (×4): 1 via ORAL

## 2013-10-03 MED ORDER — PIPERACILLIN-TAZOBACTAM 3.375 G IVPB
3.3750 g | Freq: Three times a day (TID) | INTRAVENOUS | Status: DC
  Administered 2013-10-03 – 2013-10-05 (×5): 3.375 g via INTRAVENOUS
  Filled 2013-10-03 (×8): qty 50

## 2013-10-03 MED ORDER — CEFAZOLIN SODIUM-DEXTROSE 2-3 GM-% IV SOLR
2.0000 g | INTRAVENOUS | Status: AC
  Administered 2013-10-04: 2 g via INTRAVENOUS
  Filled 2013-10-03: qty 50

## 2013-10-03 MED ORDER — ACETAMINOPHEN 325 MG PO TABS
650.0000 mg | ORAL_TABLET | Freq: Once | ORAL | Status: AC
  Administered 2013-10-03: 650 mg via ORAL
  Filled 2013-10-03: qty 2

## 2013-10-03 NOTE — Progress Notes (Signed)
General Surgery Note  LOS: 2 days  POD -     Assessment/Plan: 1.  Gall stone pancreatitis  On Ancef - reason?  Normal LFT's  Lipase - 24 - 10/03/2013  We already have multiple emergency operations scheduled for today.  So possible lap chole tomorrow or Monday.  I explained this to the daughter in law and patient.  2.  Cholelithiasis 3.  Nepalese - does not speak English 4.  HTN 5.  Anemia - Hgb - 10.5 - 10/03/2013 6.  Leukopenia - WBC - 2,900 - 10/03/2013 7.  Borderline thromocytopenia -   Plt - 124,000 - 10/03/2013 8.  Leg cramps   Principal Problem:   Biliary colic Active Problems:   Hypertension   Gallstones   Other pancytopenia   Leg cramps  Subjective:  Daughter in law in room ([place]) and acted as Equities traderinterpreter.  She complains of some pain, but looks comfortable.   Objective:   Filed Vitals:   10/03/13 0936  BP: 133/67  Pulse: 82  Temp: 98.2 F (36.8 C)  Resp: 20     Intake/Output from previous day:  08/14 0701 - 08/15 0700 In: -  Out: 1000 [Urine:1000]  Intake/Output this shift:  Total I/O In: 0  Out: 400 [Urine:400]   Physical Exam:   General: WN older F who is alert and oriented.    HEENT: Normal. Pupils equal. .   Lungs: Clear   Abdomen: Soft.  Has BS.     Lab Results:    Recent Labs  10/02/13 0610 10/03/13 0440  WBC 3.9* 2.9*  HGB 10.8* 10.5*  HCT 33.3* 33.3*  PLT 121* 124*    BMET   Recent Labs  10/02/13 0610 10/03/13 0440  NA 137 141  K 3.9 4.0  CL 102 108  CO2 23 21  GLUCOSE 96 67*  BUN 10 10  CREATININE 0.83 0.78  CALCIUM 7.8* 8.4    PT/INR   Recent Labs  10/02/13 0610  LABPROT 13.8  INR 1.06    ABG  No results found for this basename: PHART, PCO2, PO2, HCO3,  in the last 72 hours   Studies/Results:  Ct Abdomen Pelvis W Contrast  10/02/2013   CLINICAL DATA:  Right lower quadrant abdominal pain. Elevated lipase.  EXAM: CT ABDOMEN AND PELVIS WITH CONTRAST  TECHNIQUE: Multidetector CT imaging of the abdomen and  pelvis was performed using the standard protocol following bolus administration of intravenous contrast.  CONTRAST:  25mL OMNIPAQUE IOHEXOL 300 MG/ML SOLN, 100mL OMNIPAQUE IOHEXOL 300 MG/ML SOLN  COMPARISON:  Abdominal ultrasound performed 08/31/2013  FINDINGS: Minimal bibasilar atelectasis is noted.  There is mild gallbladder wall thickening and question of trace pericholecystic fluid. The common hepatic duct is dilated to 1.0 cm in maximum diameter, and there is mild prominence of the intrahepatic biliary ducts. This raises concern for distal obstruction, though no definite mass or stone is characterized on this study.  The liver is otherwise unremarkable in appearance. The spleen is within normal limits. The pancreas is otherwise grossly unremarkable. The adrenal glands are normal in appearance.  The kidneys are unremarkable in appearance. There is no evidence of hydronephrosis. No renal or ureteral stones are seen. No perinephric stranding is appreciated.  No free fluid is identified. The small bowel is unremarkable in appearance. The stomach is within normal limits. No acute vascular abnormalities are seen.  The appendix is normal in caliber, without evidence for appendicitis. The colon is unremarkable in appearance.  The bladder is moderately  distended and grossly unremarkable. The uterus is grossly unremarkable. The ovaries are relatively symmetric. No suspicious adnexal masses are seen. No inguinal lymphadenopathy is seen.  No acute osseous abnormalities are identified. There is mild chronic loss of height at L3.  IMPRESSION: 1. Dilatation of the common hepatic duct to 1.0 cm, with mild prominence of the intrahepatic biliary ducts. This raises concern for distal obstruction, though no definite mass or stone is characterized on this study. MRCP or ERCP would be helpful for further evaluation, when and as deemed clinically appropriate. 2. Mild gallbladder wall thickening and question of trace pericholecystic  fluid. This could reflect the obstruction described above, though mild cholecystitis cannot be entirely excluded.   Electronically Signed   By: Roanna Raider M.D.   On: 10/02/2013 02:26     Anti-infectives:   Anti-infectives   Start     Dose/Rate Route Frequency Ordered Stop   10/03/13 0600  ceFAZolin (ANCEF) IVPB 2 g/50 mL premix     2 g 100 mL/hr over 30 Minutes Intravenous On call to O.R. 10/02/13 1626 10/04/13 0559      Ovidio Kin, MD, FACS Pager: [phone] [facility] Office: [phone] 10/03/2013

## 2013-10-03 NOTE — Progress Notes (Signed)
[facility] PROGRESS NOTE  Dalaysia Haskel Schroeder XNA:[id number] DOB: 12-Feb-1951 DOA: 10/01/2013 PCP: Chari Manning, NP  Assessment/Plan  Probable gallstone pancreatitis with dilation of biliary tree on CT and gallstones in gallbladder, but bilirubin/alk phos normal suggesting stone has recently passed or only partial obstruction.  LFTs have remained stable.   -  Appreciate General surgery assistance -  Likely for laparoscopic cholecystectomy with intraoperative cholangiogram today -  Continue IVF and antiemetics  Bilateral leg cramps from the hips to ankles, unclear etiology -  Check magnesium level -  Potassium within normal limits  Hypertension, blood pressure stable -  Continue lisinopril  -  Resume diuretic once clinically improving  Leukopenia, mild, ANC 1.4 -  HIV  Microcytic anemia, hemoglobin stable -  Occult stool -  Consider parasitic infection -  Iron studies, b12, folate, TSH  Thrombocytopenia, likely acute phase reactant -  Trend plt  Globulin gap -  HIV, HCV, SPEP/UPEP/IFE pending  Hypoglycemia, due to n.p.o. -  Use dextrose fluids  Diet:  NPO Access:  PIV IVF:  yes Proph:  Refusing lovenox, add SCDs  Code Status: full Family Communication: patient alone via [place] interpreter Disposition Plan: pending lap chole   Consultants:  Gen surg  Procedures:  CT abd/pelvis  Antibiotics:  none   HPI/Subjective:  Having bilateral leg cramps from the hips to ankles. Nothing makes it better or worse. Denies nausea vomiting. No bowel movement since yesterday. States her abdomen feels okay.  Objective: Filed Vitals:   10/02/13 0929 10/02/13 2021 10/02/13 2136 10/03/13 0546  BP: 149/59  121/77 139/84  Pulse: 77  72 85  Temp: 97.5 F (36.4 C)  98.2 F (36.8 C) 98.1 F (36.7 C)  TempSrc: Oral  Oral Oral  Resp: 20  20 19   Height:  4' 11"  (1.499 m)    Weight:  61.598 kg (135 lb 12.8 oz)    SpO2: 96%  95% 95%    Intake/Output Summary (Last 24 hours)  at 10/03/13 0758 Last data filed at 10/03/13 0246  Gross per 24 hour  Intake      0 ml  Output   1000 ml  Net  -1000 ml   Filed Weights   10/02/13 0502 10/02/13 0503 10/02/13 2021  Weight: 60.7 kg (133 lb 13.1 oz) 60.374 kg (133 lb 1.6 oz) 61.598 kg (135 lb 12.8 oz)    Exam:   General:  Adult F, No acute distress  HEENT:  NCAT, MMM  Cardiovascular:  RRR, nl S1, S2 2/6 systolic murmur right upper sternal border, 2+ pulses, warm extremities  Respiratory:  CTAB, no increased WOB  Abdomen:   NABS, soft, TTP in epigastrium without rebound or guarding  MSK:   Normal tone and bulk, no LEE, normal range of motion bilateral hips, no palpable cords, no joint effusions, no tenderness over the greater trochanters bilaterally, no significant muscle tenderness  Neuro:  Grossly intact  Data Reviewed: Basic Metabolic Panel:  Recent Labs Lab 10/01/13 1850 10/02/13 0610 10/03/13 0440  NA 134* 137 141  K 4.0 3.9 4.0  CL 98 102 108  CO2 22 23 21   GLUCOSE 92 96 67*  BUN 13 10 10   CREATININE 0.88 0.83 0.78  CALCIUM 9.1 7.8* 8.4   Liver Function Tests:  Recent Labs Lab 10/01/13 1850 10/02/13 0610 10/03/13 0440  AST 31 43* 27  ALT 25 26 23   ALKPHOS 107 96 95  BILITOT 0.4 0.6 0.6  PROT 7.7 6.7 6.5  ALBUMIN 3.6 3.2* 3.0*  Recent Labs Lab 10/01/13 1850 10/03/13 0440  LIPASE 115* 24   No results found for this basename: AMMONIA,  in the last 168 hours CBC:  Recent Labs Lab 10/01/13 1850 10/02/13 0610 10/03/13 0440  WBC 3.8* 3.9* 2.9*  NEUTROABS 1.4*  --   --   HGB 11.4* 10.8* 10.5*  HCT 34.5* 33.3* 33.3*  MCV 76.0* 76.6* 77.6*  PLT 139* 121* 124*   Cardiac Enzymes: No results found for this basename: CKTOTAL, CKMB, CKMBINDEX, TROPONINI,  in the last 168 hours BNP (last 3 results) No results found for this basename: PROBNP,  in the last 8760 hours CBG: No results found for this basename: GLUCAP,  in the last 168 hours  Recent Results (from the past 240  hour(s))  SURGICAL PCR SCREEN     Status: None   Collection Time    10/03/13  5:01 AM      Result Value Ref Range Status   MRSA, PCR NEGATIVE  NEGATIVE Final   Staphylococcus aureus NEGATIVE  NEGATIVE Final   Comment:            The Xpert SA Assay (FDA     approved for NASAL specimens     in patients over 65 years of age),     is one component of     a comprehensive surveillance     program.  Test performance has     been validated by [employer] for patients greater     than or equal to 18 year old.     It is not intended     to diagnose infection nor to     guide or monitor treatment.     Studies: Ct Abdomen Pelvis W Contrast  10/02/2013   CLINICAL DATA:  Right lower quadrant abdominal pain. Elevated lipase.  EXAM: CT ABDOMEN AND PELVIS WITH CONTRAST  TECHNIQUE: Multidetector CT imaging of the abdomen and pelvis was performed using the standard protocol following bolus administration of intravenous contrast.  CONTRAST:  34m OMNIPAQUE IOHEXOL 300 MG/ML SOLN, 1022mOMNIPAQUE IOHEXOL 300 MG/ML SOLN  COMPARISON:  Abdominal ultrasound performed 08/31/2013  FINDINGS: Minimal bibasilar atelectasis is noted.  There is mild gallbladder wall thickening and question of trace pericholecystic fluid. The common hepatic duct is dilated to 1.0 cm in maximum diameter, and there is mild prominence of the intrahepatic biliary ducts. This raises concern for distal obstruction, though no definite mass or stone is characterized on this study.  The liver is otherwise unremarkable in appearance. The spleen is within normal limits. The pancreas is otherwise grossly unremarkable. The adrenal glands are normal in appearance.  The kidneys are unremarkable in appearance. There is no evidence of hydronephrosis. No renal or ureteral stones are seen. No perinephric stranding is appreciated.  No free fluid is identified. The small bowel is unremarkable in appearance. The stomach is within normal limits. No acute  vascular abnormalities are seen.  The appendix is normal in caliber, without evidence for appendicitis. The colon is unremarkable in appearance.  The bladder is moderately distended and grossly unremarkable. The uterus is grossly unremarkable. The ovaries are relatively symmetric. No suspicious adnexal masses are seen. No inguinal lymphadenopathy is seen.  No acute osseous abnormalities are identified. There is mild chronic loss of height at L3.  IMPRESSION: 1. Dilatation of the common hepatic duct to 1.0 cm, with mild prominence of the intrahepatic biliary ducts. This raises concern for distal obstruction, though no definite mass or stone  is characterized on this study. MRCP or ERCP would be helpful for further evaluation, when and as deemed clinically appropriate. 2. Mild gallbladder wall thickening and question of trace pericholecystic fluid. This could reflect the obstruction described above, though mild cholecystitis cannot be entirely excluded.   Electronically Signed   By: Garald Balding M.D.   On: 10/02/2013 02:26    Scheduled Meds: .  ceFAZolin (ANCEF) IV  2 g Intravenous On Call to OR  . famotidine  20 mg Oral BID  . lisinopril  20 mg Oral Daily  . pneumococcal 23 valent vaccine  0.5 mL Intramuscular Tomorrow-1000   Continuous Infusions: . sodium chloride 75 mL/hr at 10/02/13 2025    Principal Problem:   Biliary colic Active Problems:   Hypertension   Gallstones    Time spent: 30 min    Dellanira Dillow, [facility] Pager [phone]. If 7PM-7AM, please contact night-coverage at [email or web address], password [facility] 10/03/2013, 7:58 AM  LOS: 2 days

## 2013-10-03 NOTE — Progress Notes (Signed)
Telephone to [employer] for [employer] translator. Explained to pt that surgery was postponed until tomorrow or Monday. Pt stated that her pain in her belly is 9/10. Pt advised that RN will give pain medication through her IV. Explained to pt about pneumonia vaccine. Pt consented to receive in her L deltoid. Also explained about [employer] Breeze ordered by dietitian. Pt reported that she likes orange flavor. Pt verbalized no other questions at this time. Continue to monitor. Hortencia ConradiWendi Araeya Lamb, RN

## 2013-10-03 NOTE — Progress Notes (Signed)
Patients temperature 101.1, MD notified.  Blood cultures, tylenol, and abx ordered.

## 2013-10-03 NOTE — Progress Notes (Signed)
ANTIBIOTIC CONSULT NOTE - INITIAL  Pharmacy Consult for zosyn Indication: Pancreatitis?  No Known Allergies  Patient Measurements: Height: 4\' 11"  (149.9 cm) Weight: 135 lb 12.8 oz (61.598 kg) IBW/kg (Calculated) : 43.2 Adjusted Body Weight:   Vital Signs: Temp: 101.1 F (38.4 C) (08/15 2035) Temp src: Oral (08/15 2035) BP: 137/77 mmHg (08/15 2035) Pulse Rate: 82 (08/15 2035) Intake/Output from previous day: 08/14 0701 - 08/15 0700 In: -  Out: 1000 [Urine:1000] Intake/Output from this shift:    Labs:  Recent Labs  10/01/13 1850 10/02/13 0610 10/03/13 0440  WBC 3.8* 3.9* 2.9*  HGB 11.4* 10.8* 10.5*  PLT 139* 121* 124*  CREATININE 0.88 0.83 0.78   Estimated Creatinine Clearance: 56 ml/min (by C-G formula based on Cr of 0.78). No results found for this basename: VANCOTROUGH, Leodis BinetVANCOPEAK, VANCORANDOM, GENTTROUGH, GENTPEAK, GENTRANDOM, TOBRATROUGH, TOBRAPEAK, TOBRARND, AMIKACINPEAK, AMIKACINTROU, AMIKACIN,  in the last 72 hours   Microbiology: Recent Results (from the past 720 hour(s))  SURGICAL PCR SCREEN     Status: None   Collection Time    10/03/13  5:01 AM      Result Value Ref Range Status   MRSA, PCR NEGATIVE  NEGATIVE Final   Staphylococcus aureus NEGATIVE  NEGATIVE Final   Comment:            The Xpert SA Assay (FDA     approved for NASAL specimens     in patients over 65 years of age),     is one component of     a comprehensive surveillance     program.  Test performance has     been validated by [facility] for patients greater     than or equal to 65 year old.     It is not intended     to diagnose infection nor to     guide or monitor treatment.    Medical History: Past Medical History  Diagnosis Date  . Hypertension   . Chronic knee pain   . GERD (gastroesophageal reflux disease)   . Gallstone pancreatitis   . Cholelithiasis     Medications:  Scheduled:  . acetaminophen  650 mg Oral Once  .  ceFAZolin (ANCEF) IV  2 g Intravenous  On Call to OR  . [START ON 10/04/2013]  ceFAZolin (ANCEF) IV  2 g Intravenous On Call to OR  . famotidine  20 mg Oral BID  . feeding supplement (RESOURCE BREEZE)  1 Container Oral TID BM  . lisinopril  20 mg Oral Daily   Infusions:  . dextrose 5 % and 0.45 % NaCl with KCl 10 mEq/L 75 mL/hr at 10/03/13 0941   Assessment: 63 yo who came in with gallstone pancreatitis. He is going to get cholecystectomy. He developed a fever tonight so empiric zosyn has been ordered.   Goal of Therapy:  Appropriate dose  Plan:   Zosyn 3.375g IV q8

## 2013-10-04 ENCOUNTER — Inpatient Hospital Stay (HOSPITAL_COMMUNITY): Payer: Medicaid Other | Admitting: Anesthesiology

## 2013-10-04 ENCOUNTER — Encounter (HOSPITAL_COMMUNITY): Payer: Self-pay | Admitting: Anesthesiology

## 2013-10-04 ENCOUNTER — Encounter (HOSPITAL_COMMUNITY): Payer: Medicaid Other | Admitting: Anesthesiology

## 2013-10-04 ENCOUNTER — Inpatient Hospital Stay (HOSPITAL_COMMUNITY): Payer: Medicaid Other

## 2013-10-04 ENCOUNTER — Encounter (HOSPITAL_COMMUNITY)
Admission: EM | Disposition: A | Discharge status: Home / Self Care | Payer: Self-pay | Source: Home / Self Care | Attending: Internal Medicine

## 2013-10-04 DIAGNOSIS — K810 Acute cholecystitis: Secondary | ICD-10-CM

## 2013-10-04 DIAGNOSIS — K8010 Calculus of gallbladder with chronic cholecystitis without obstruction: Secondary | ICD-10-CM

## 2013-10-04 DIAGNOSIS — D61818 Other pancytopenia: Secondary | ICD-10-CM

## 2013-10-04 HISTORY — PX: CHOLECYSTECTOMY: SHX55

## 2013-10-04 LAB — URINE MICROSCOPIC-ADD ON

## 2013-10-04 LAB — URINALYSIS, ROUTINE W REFLEX MICROSCOPIC
BILIRUBIN URINE: NEGATIVE
Glucose, UA: NEGATIVE mg/dL
Ketones, ur: NEGATIVE mg/dL
Nitrite: NEGATIVE
Protein, ur: NEGATIVE mg/dL
SPECIFIC GRAVITY, URINE: 1.006 (ref 1.005–1.030)
Urobilinogen, UA: 1 mg/dL (ref 0.0–1.0)
pH: 6 (ref 5.0–8.0)

## 2013-10-04 SURGERY — LAPAROSCOPIC CHOLECYSTECTOMY WITH INTRAOPERATIVE CHOLANGIOGRAM
Anesthesia: General

## 2013-10-04 MED ORDER — 0.9 % SODIUM CHLORIDE (POUR BTL) OPTIME
TOPICAL | Status: DC | PRN
Start: 2013-10-04 — End: 2013-10-04
  Administered 2013-10-04: 1000 mL

## 2013-10-04 MED ORDER — BUPIVACAINE-EPINEPHRINE 0.25% -1:200000 IJ SOLN
INTRAMUSCULAR | Status: DC | PRN
  Administered 2013-10-04: 2 mL

## 2013-10-04 MED ORDER — IOHEXOL 300 MG/ML  SOLN
INTRAMUSCULAR | Status: DC | PRN
Start: 2013-10-04 — End: 2013-10-04
  Administered 2013-10-04: 5 mL

## 2013-10-04 MED ORDER — OXYCODONE HCL 5 MG/5ML PO SOLN: 5.0000 mg | Freq: Once | ORAL | Status: DC | PRN

## 2013-10-04 MED ORDER — LIDOCAINE HCL (CARDIAC) 20 MG/ML IV SOLN
INTRAVENOUS | Status: AC
  Filled 2013-10-04: qty 5

## 2013-10-04 MED ORDER — FENTANYL CITRATE 0.05 MG/ML IJ SOLN
INTRAMUSCULAR | Status: DC | PRN
  Administered 2013-10-04: 50 ug via INTRAVENOUS
  Administered 2013-10-04: 100 ug via INTRAVENOUS

## 2013-10-04 MED ORDER — EPHEDRINE SULFATE 50 MG/ML IJ SOLN
INTRAMUSCULAR | Status: AC
  Filled 2013-10-04: qty 1

## 2013-10-04 MED ORDER — HYDROMORPHONE HCL PF 1 MG/ML IJ SOLN: 0.2500 mg | INTRAMUSCULAR | Status: DC | PRN

## 2013-10-04 MED ORDER — ONDANSETRON HCL 4 MG/2ML IJ SOLN
INTRAMUSCULAR | Status: AC
  Filled 2013-10-04: qty 2

## 2013-10-04 MED ORDER — GLYCOPYRROLATE 0.2 MG/ML IJ SOLN
INTRAMUSCULAR | Status: DC | PRN
  Administered 2013-10-04: .6 mg via INTRAVENOUS

## 2013-10-04 MED ORDER — ONDANSETRON HCL 4 MG/2ML IJ SOLN
INTRAMUSCULAR | Status: DC | PRN
  Administered 2013-10-04: 4 mg via INTRAVENOUS

## 2013-10-04 MED ORDER — SODIUM CHLORIDE 0.9 % IJ SOLN
INTRAMUSCULAR | Status: AC
  Filled 2013-10-04: qty 10

## 2013-10-04 MED ORDER — LACTATED RINGERS IV SOLN
INTRAVENOUS | Status: DC | PRN
  Administered 2013-10-04 (×2): via INTRAVENOUS

## 2013-10-04 MED ORDER — PROPOFOL 10 MG/ML IV BOLUS
INTRAVENOUS | Status: DC | PRN
  Administered 2013-10-04: 100 mg via INTRAVENOUS

## 2013-10-04 MED ORDER — BUPIVACAINE-EPINEPHRINE (PF) 0.25% -1:200000 IJ SOLN
INTRAMUSCULAR | Status: AC
  Filled 2013-10-04: qty 30

## 2013-10-04 MED ORDER — PROPOFOL 10 MG/ML IV BOLUS
INTRAVENOUS | Status: AC
  Filled 2013-10-04: qty 20

## 2013-10-04 MED ORDER — NEOSTIGMINE METHYLSULFATE 10 MG/10ML IV SOLN
INTRAVENOUS | Status: DC | PRN
  Administered 2013-10-04: 3 mg via INTRAVENOUS

## 2013-10-04 MED ORDER — OXYCODONE-ACETAMINOPHEN 5-325 MG PO TABS
1.0000 | ORAL_TABLET | ORAL | Status: DC | PRN
  Administered 2013-10-04 (×3): 2 via ORAL
  Filled 2013-10-04 (×4): qty 2

## 2013-10-04 MED ORDER — ROCURONIUM BROMIDE 100 MG/10ML IV SOLN
INTRAVENOUS | Status: DC | PRN
  Administered 2013-10-04: 30 mg via INTRAVENOUS

## 2013-10-04 MED ORDER — LIDOCAINE HCL (CARDIAC) 20 MG/ML IV SOLN
INTRAVENOUS | Status: DC | PRN
  Administered 2013-10-04: 40 mg via INTRAVENOUS

## 2013-10-04 MED ORDER — FENTANYL CITRATE 0.05 MG/ML IJ SOLN
INTRAMUSCULAR | Status: AC
  Filled 2013-10-04: qty 5

## 2013-10-04 MED ORDER — ROCURONIUM BROMIDE 50 MG/5ML IV SOLN
INTRAVENOUS | Status: AC
  Filled 2013-10-04: qty 1

## 2013-10-04 MED ORDER — OXYCODONE HCL 5 MG PO TABS: 5.0000 mg | ORAL_TABLET | Freq: Once | ORAL | Status: DC | PRN

## 2013-10-04 MED ORDER — SUCCINYLCHOLINE CHLORIDE 20 MG/ML IJ SOLN
INTRAMUSCULAR | Status: AC
  Filled 2013-10-04: qty 1

## 2013-10-04 MED ORDER — MIDAZOLAM HCL 2 MG/2ML IJ SOLN
INTRAMUSCULAR | Status: AC
  Filled 2013-10-04: qty 2

## 2013-10-04 MED ORDER — ARTIFICIAL TEARS OP OINT
TOPICAL_OINTMENT | OPHTHALMIC | Status: AC
  Filled 2013-10-04: qty 3.5

## 2013-10-04 SURGICAL SUPPLY — 43 items
APPLIER CLIP 5 13 M/L LIGAMAX5 (MISCELLANEOUS) ×3
BENZOIN TINCTURE PRP APPL 2/3 (GAUZE/BANDAGES/DRESSINGS) ×3 IMPLANT
CANISTER SUCTION 2500CC (MISCELLANEOUS) ×3 IMPLANT
CHLORAPREP W/TINT 26ML (MISCELLANEOUS) ×3 IMPLANT
CLIP APPLIE 5 13 M/L LIGAMAX5 (MISCELLANEOUS) ×1 IMPLANT
CLIP LIGATING HEMO O LOK GREEN (MISCELLANEOUS) ×3 IMPLANT
CLOSURE STERI-STRIP 1/2X4 (GAUZE/BANDAGES/DRESSINGS) ×1
CLSR STERI-STRIP ANTIMIC 1/2X4 (GAUZE/BANDAGES/DRESSINGS) ×2 IMPLANT
COVER MAYO STAND STRL (DRAPES) ×3 IMPLANT
COVER SURGICAL LIGHT HANDLE (MISCELLANEOUS) ×3 IMPLANT
COVER TRANSDUCER ULTRASND (DRAPES) IMPLANT
DEVICE TROCAR PUNCTURE CLOSURE (ENDOMECHANICALS) ×3 IMPLANT
DRAPE C-ARM 42X72 X-RAY (DRAPES) ×3 IMPLANT
DRAPE UTILITY 15X26 W/TAPE STR (DRAPE) ×6 IMPLANT
ELECT REM PT RETURN 9FT ADLT (ELECTROSURGICAL) ×3
ELECTRODE REM PT RTRN 9FT ADLT (ELECTROSURGICAL) ×1 IMPLANT
GAUZE SPONGE 2X2 8PLY STRL LF (GAUZE/BANDAGES/DRESSINGS) ×1 IMPLANT
GLOVE BIO SURGEON STRL SZ7.5 (GLOVE) ×3 IMPLANT
GOWN STRL REUS W/ TWL LRG LVL3 (GOWN DISPOSABLE) ×3 IMPLANT
GOWN STRL REUS W/ TWL XL LVL3 (GOWN DISPOSABLE) ×1 IMPLANT
GOWN STRL REUS W/TWL LRG LVL3 (GOWN DISPOSABLE) ×6
GOWN STRL REUS W/TWL XL LVL3 (GOWN DISPOSABLE) ×2
IV CATH 14GX2 1/4 (CATHETERS) ×3 IMPLANT
KIT BASIN OR (CUSTOM PROCEDURE TRAY) ×3 IMPLANT
KIT ROOM TURNOVER OR (KITS) ×3 IMPLANT
NEEDLE INSUFFLATION 14GA 120MM (NEEDLE) ×3 IMPLANT
NS IRRIG 1000ML POUR BTL (IV SOLUTION) ×3 IMPLANT
PAD ARMBOARD 7.5X6 YLW CONV (MISCELLANEOUS) ×6 IMPLANT
POUCH SPECIMEN RETRIEVAL 10MM (ENDOMECHANICALS) ×3 IMPLANT
SCISSORS LAP 5X35 DISP (ENDOMECHANICALS) ×3 IMPLANT
SET CHOLANGIOGRAPHY FRANKLIN (SET/KITS/TRAYS/PACK) ×6 IMPLANT
SET IRRIG TUBING LAPAROSCOPIC (IRRIGATION / IRRIGATOR) ×3 IMPLANT
SLEEVE ENDOPATH XCEL 5M (ENDOMECHANICALS) ×3 IMPLANT
SPECIMEN JAR SMALL (MISCELLANEOUS) ×3 IMPLANT
SPONGE GAUZE 2X2 STER 10/PKG (GAUZE/BANDAGES/DRESSINGS) ×2
SUT MNCRL AB 3-0 PS2 18 (SUTURE) ×3 IMPLANT
SUT VICRYL 0 UR6 27IN ABS (SUTURE) ×3 IMPLANT
TAPE CLOTH SURG 4X10 WHT LF (GAUZE/BANDAGES/DRESSINGS) ×3 IMPLANT
TOWEL OR 17X24 6PK STRL BLUE (TOWEL DISPOSABLE) ×3 IMPLANT
TOWEL OR 17X26 10 PK STRL BLUE (TOWEL DISPOSABLE) ×3 IMPLANT
TRAY LAPAROSCOPIC (CUSTOM PROCEDURE TRAY) ×3 IMPLANT
TROCAR XCEL NON-BLD 11X100MML (ENDOMECHANICALS) ×3 IMPLANT
TROCAR XCEL NON-BLD 5MMX100MML (ENDOMECHANICALS) ×3 IMPLANT

## 2013-10-04 NOTE — Plan of Care (Signed)
Problem: Consults Goal: General Surgical Patient Education (See Patient Education module for education specifics) Outcome: Completed/Met Date Met:  10/04/13 Education pertaining to surgery provided to the patient through [employer].

## 2013-10-04 NOTE — OR Nursing (Signed)
Pt noted to have small amount of bleeding noted in right upper abdomen after procedure was finished and MD out of room.  There was a small hard hematoma also noted.  Nurse notified MD by text page and gave report to PACU nurse of change from baseline regarding bleeding.  Dressing applied as indicated.

## 2013-10-04 NOTE — Anesthesia Postprocedure Evaluation (Signed)
  Anesthesia Post-op Note  Patient: Michelle Boyd  Procedure(s) Performed: Procedure(s): LAPAROSCOPIC CHOLECYSTECTOMY WITH INTRAOPERATIVE CHOLANGIOGRAM (N/A)  Patient Location: PACU  Anesthesia Type:General  Level of Consciousness: awake and alert   Airway and Oxygen Therapy: Patient Spontanous Breathing  Post-op Pain: mild  Post-op Assessment: Post-op Vital signs reviewed, Patient's Cardiovascular Status Stable and Respiratory Function Stable  Post-op Vital Signs: Reviewed  Filed Vitals:   10/04/13 1030  BP:   Pulse:   Temp: 36.6 C  Resp:     Complications: No apparent anesthesia complications

## 2013-10-04 NOTE — Plan of Care (Signed)
Problem: Phase I Progression Outcomes Goal: Pain controlled with appropriate interventions Outcome: Completed/Met Date Met:  10/04/13 Pain controlled with PO percocet and IV morphine. Continue to monitor.

## 2013-10-04 NOTE — Progress Notes (Signed)
[facility] about 0730 this morning. Spoke with "[name]." Pt and family member informed that OR was on their wau to pick up pt for surgery. Informed pt via interpreter about CHG cloths, and that pt needed to be wearing a hospital gown only. IVF capped. Pt ambulated to bathroom to void, and urine sent for UA. CHG bath completed. Pt was also informed of increased temp during night, which was reason for blood draws and xray this morning. Verified through interpreter that pt did not have dentures in place. Ancef and consent verified on chart. Hortencia ConradiWendi Darien Mignogna, RN

## 2013-10-04 NOTE — Progress Notes (Signed)
Received pt back from OR. Pt somnolent, but grimaces to pain during abdominal assessment. 4 dressings clean, dry, and intact, except small amount drainage on 1 dressing. VSS. Orders reviewed. Family now at bedside. Will continue to monitor. Hortencia ConradiWendi Theodis Kinsel, RN

## 2013-10-04 NOTE — Transfer of Care (Signed)
Immediate Anesthesia Transfer of Care Note  Patient: Michelle Boyd  Procedure(s) Performed: Procedure(s): LAPAROSCOPIC CHOLECYSTECTOMY WITH INTRAOPERATIVE CHOLANGIOGRAM (N/A)  Patient Location: PACU  Anesthesia Type:General  Level of Consciousness: sedated  Airway & Oxygen Therapy: Patient Spontanous Breathing and Patient connected to nasal cannula oxygen  Post-op Assessment: Report given to PACU RN, Post -op Vital signs reviewed and stable and Patient moving all extremities X 4  Post vital signs: Reviewed and stable  Complications: No apparent anesthesia complications

## 2013-10-04 NOTE — Anesthesia Preprocedure Evaluation (Addendum)
Anesthesia Evaluation  Patient identified by MRN, date of birth, ID band Patient awake    Reviewed: Allergy & Precautions, H&P , NPO status , Patient's Chart, lab work & pertinent test results  Airway Mallampati: II TM Distance: >3 FB Neck ROM: Full    Dental no notable dental hx. (+) Teeth Intact, Dental Advisory Given   Pulmonary neg pulmonary ROS,  breath sounds clear to auscultation  Pulmonary exam normal       Cardiovascular hypertension, On Medications Rhythm:Regular Rate:Normal     Neuro/Psych negative neurological ROS  negative psych ROS   GI/Hepatic Neg liver ROS, GERD-  Medicated and Controlled,  Endo/Other  negative endocrine ROS  Renal/GU negative Renal ROS  negative genitourinary   Musculoskeletal   Abdominal   Peds  Hematology negative hematology ROS (+) anemia ,   Anesthesia Other Findings   Reproductive/Obstetrics negative OB ROS                          Anesthesia Physical Anesthesia Plan  ASA: II  Anesthesia Plan: General   Post-op Pain Management:    Induction: Intravenous  Airway Management Planned: Oral ETT  Additional Equipment:   Intra-op Plan:   Post-operative Plan: Extubation in OR  Informed Consent: I have reviewed the patients History and Physical, chart, labs and discussed the procedure including the risks, benefits and alternatives for the proposed anesthesia with the patient or authorized representative who has indicated his/her understanding and acceptance.   Dental advisory given  Plan Discussed with: CRNA  Anesthesia Plan Comments: (Discussed plan, including risks through interpreter.)       Anesthesia Quick Evaluation

## 2013-10-04 NOTE — Op Note (Signed)
10/01/2013 - 10/04/2013  9:43 AM  PATIENT:  Michelle Boyd  63 y.o. female  PRE-OPERATIVE DIAGNOSIS:  cholecystitis  POST-OPERATIVE DIAGNOSIS:  cholecystitis  PROCEDURE:  Procedure(s): LAPAROSCOPIC CHOLECYSTECTOMY WITH INTRAOPERATIVE CHOLANGIOGRAM (N/A)  SURGEON:  Surgeon(s) and Role:    * Axel FillerArmando Zacharey Jensen, MD - Primary   ASSISTANTS: none   ANESTHESIA:   local and general  EBL:  Total I/O In: 1001 [I.V.:1001] Out: 110 [Urine:100; Blood:10]  BLOOD ADMINISTERED:none  DRAINS: none   LOCAL MEDICATIONS USED:  BUPIVICAINE   SPECIMEN:  Source of Specimen:  Gallbladder   DISPOSITION OF SPECIMEN:  PATHOLOGY  COUNTS:  YES  TOURNIQUET:  * No tourniquets in log *  DICTATION: .Dragon Dictation  Details of the procedure: The patient was taken to the operating and placed in the supine position with bilateral SCDs in place. A time out was called and all facts were verified. A pneumoperitoneum was obtained via A Veress needle technique to a pressure of 14mm of mercury. A 5mm trochar was then placed in the right upper quadrant under visualization, and there were no injuries to any abdominal organs. A 11 mm port was then placed in the umbilical region after infiltrating with local anesthesia under direct visualization. A second epigastric port was placed under direct visualization. The gallbladder was identified and retracted, the peritoneum was then sharply dissected from the gallbladder and this dissection was carried down to Calot's triangle. The cystic duct was identified and stripped away circumferentially and seen going into the gallbladder 360, the critical angle was obtained. It was noted to be very dilated and large. A Cook catheter was used to perform an intraoperative cholangiogram. The cystic duct and common bile duct were seen free of filling defects. 2 clips were placed proximally one distally and the cystic duct transected. The cystic artery was identified and 2 clips placed proximally  and one distally and transected. We then proceeded to remove the gallbladder off the hepatic fossa with Bovie cautery. A retrieval bag was then placed in the abdomen and gallbladder placed in the bag. The hepatic fossa was then reexamined and hemostasis was achieved with Bovie cautery and was excellent at this portion of the case. The subhepatic fossa and perihepatic fossa was then irrigated until the effluent was clear. The 11 mm trocar fascia was reapproximated with the Endo Close #1 Vicryl x2. The pneumoperitoneum was evacuated and all trochars removed under direct visulalization. The skin was then closed with 4-0 Monocryl and the skin dressed with Steri-Strips, gauze, and tape. The patient was awaken from general anesthesia and taken to the recovery room in stable condition.    PLAN OF CARE: admitted to floor  PATIENT DISPOSITION:  PACU - hemodynamically stable.   Delay start of Pharmacological VTE agent (>24hrs) due to surgical blood loss or risk of bleeding: not applicable

## 2013-10-04 NOTE — Plan of Care (Signed)
Problem: Phase I Progression Outcomes Goal: Initial discharge plan identified Outcome: Completed/Met Date Met:  10/04/13 Return home with family. Goal: Voiding-avoid urinary catheter unless indicated Outcome: Completed/Met Date Met:  10/04/13 Pt has voided 1300 cc since returning from surgery. Goal: Vital signs/hemodynamically stable Outcome: Completed/Met Date Met:  10/04/13 VSS. Pt using incentive spirometry with good results.

## 2013-10-04 NOTE — Plan of Care (Signed)
Problem: Phase I Progression Outcomes Goal: OOB as tolerated unless otherwise ordered Outcome: Completed/Met Date Met:  10/04/13 Pt ambulates to the bathroom with assistance.

## 2013-10-04 NOTE — Progress Notes (Signed)
[facility] PROGRESS NOTE  Michelle Boyd HWK:[id number] DOB: [birth year] DOA: 10/01/2013 PCP: Chari Manning, NP  Assessment/Plan  Probable gallstone pancreatitis with dilation of biliary tree on CT and gallstones in gallbladder, but bilirubin/alk phos normal suggesting stone has recently passed or only partial obstruction.  LFTs have remained stable.   -  Appreciate General surgery assistance -  laparoscopic cholecystectomy with intraoperative cholangiogram today:  No filling defects  -  Continue IVF and antiemetics  Fever, most likely secondary to cholecystitis -  UA:  11-20 WBC -  Add urine culture -  BCx pending -  CXR with possible infiltrate vs. Atelectasis -  Continue zosyn day 1 -  IS and repeat CXR tomorrow  Bilateral leg cramps from the hips to ankles, unclear etiology -  Magnesium level wnl -  Potassium within normal limits  Hypertension, blood pressure stable -  Continue lisinopril  -  Resume diuretic once clinically improving  Leukopenia, mild, ANC 1.4 -  HIV  Microcytic anemia, hemoglobin stable -  Occult stool -  Consider parasitic infection -  Iron studies c/w iron deficiency vs. Inflammation (transferrin pending), b12 342, folate 750, TSH 0.567 -  GI follow up  Thrombocytopenia, likely acute phase reactant -  Trend plt  Globulin gap -  HIV NR, HCV neg, SPEP/UPEP/IFE pending  Hypoglycemia, resolved with dextrose fluids  Diet:  Per surgery Access:  PIV IVF:  yes Proph:  Refusing lovenox, add SCDs  Code Status: full Family Communication:  patient and family member who was at bedside via [place] interpreter Disposition Plan:  Pending recovery from surgery   Consultants:  Gen surg  Procedures:  CT abd/pelvis  Antibiotics:  none   HPI/Subjective:  Abdominal pain post-operatively.  Pain medication decreasing pain but not taking it away.  Coughing and a little SOB since surgery.    Objective: Filed Vitals:   10/04/13 1015 10/04/13 1030  10/04/13 1045 10/04/13 1111  BP: 176/82 178/81 180/80 140/76  Pulse: 68 74 67 72  Temp:  97.8 F (36.6 C)  98.1 F (36.7 C)  TempSrc:      Resp: 17 16 14 16   Height:      Weight:      SpO2: 97% 98% 99% 99%    Intake/Output Summary (Last 24 hours) at 10/04/13 1120 Last data filed at 10/04/13 0937  Gross per 24 hour  Intake   1601 ml  Output   1960 ml  Net   -359 ml   Filed Weights   10/02/13 0502 10/02/13 0503 10/02/13 2021  Weight: 60.7 kg (133 lb 13.1 oz) 60.374 kg (133 lb 1.6 oz) 61.598 kg (135 lb 12.8 oz)    Exam:   General:  Adult F, No acute distress, asleep but easily arousable  HEENT:  NCAT, MMM  Cardiovascular:  RRR, nl S1, S2 2/6 systolic murmur right upper sternal border, 2+ pulses, warm extremities  Respiratory:  CTAB anteriorly, no increased WOB  Abdomen:   NABS, soft, TTP in RUQ, epigastrium witih some voluntary guarding in these areas, mildly distended  MSK:   Normal tone and bulk, no LEE  Neuro:  Grossly intact  Data Reviewed: Basic Metabolic Panel:  Recent Labs Lab 10/01/13 1850 10/02/13 0610 10/03/13 0440  NA 134* 137 141  K 4.0 3.9 4.0  CL 98 102 108  CO2 22 23 21   GLUCOSE 92 96 67*  BUN 13 10 10   CREATININE 0.88 0.83 0.78  CALCIUM 9.1 7.8* 8.4  MG  --   --  1.8   Liver Function Tests:  Recent Labs Lab 10/01/13 1850 10/02/13 0610 10/03/13 0440  AST 31 43* 27  ALT 25 26 23   ALKPHOS 107 96 95  BILITOT 0.4 0.6 0.6  PROT 7.7 6.7 6.5  ALBUMIN 3.6 3.2* 3.0*    Recent Labs Lab 10/01/13 1850 10/03/13 0440  LIPASE 115* 24   No results found for this basename: AMMONIA,  in the last 168 hours CBC:  Recent Labs Lab 10/01/13 1850 10/02/13 0610 10/03/13 0440  WBC 3.8* 3.9* 2.9*  NEUTROABS 1.4*  --   --   HGB 11.4* 10.8* 10.5*  HCT 34.5* 33.3* 33.3*  MCV 76.0* 76.6* 77.6*  PLT 139* 121* 124*   Cardiac Enzymes: No results found for this basename: CKTOTAL, CKMB, CKMBINDEX, TROPONINI,  in the last 168 hours BNP (last 3  results) No results found for this basename: PROBNP,  in the last 8760 hours CBG: No results found for this basename: GLUCAP,  in the last 168 hours  Recent Results (from the past 240 hour(s))  SURGICAL PCR SCREEN     Status: None   Collection Time    10/03/13  5:01 AM      Result Value Ref Range Status   MRSA, PCR NEGATIVE  NEGATIVE Final   Staphylococcus aureus NEGATIVE  NEGATIVE Final   Comment:            The Xpert SA Assay (FDA     approved for NASAL specimens     in patients over 21 years of age),     is one component of     a comprehensive surveillance     program.  Test performance has     been validated by [employer] for patients greater     than or equal to 64 year old.     It is not intended     to diagnose infection nor to     guide or monitor treatment.     Studies: Dg Cholangiogram Operative  10/04/2013   CLINICAL DATA:  Cholelithiasis  EXAM: INTRAOPERATIVE CHOLANGIOGRAM  TECHNIQUE: Cholangiographic images from the C-arm fluoroscopic device were submitted for interpretation post-operatively. Please see the procedural report for the amount of contrast and the fluoroscopy time utilized.  COMPARISON:  None.  FINDINGS: Contrast fills the biliary tree without evidence of common bile duct filling defects.  IMPRESSION: Patent biliary tree without evidence of common bile duct stones.   Electronically Signed   By: Maryclare Bean M.D.   On: 10/04/2013 09:29   Dg Chest Port 1 View  10/04/2013   CLINICAL DATA:  New onset fever.  Preop for surgery.  Chest pain.  EXAM: PORTABLE CHEST - 1 VIEW  COMPARISON:  08/31/2013.  FINDINGS: Cardiac silhouette is normal in size. No mediastinal or hilar masses.  Mild interstitial prominence, stable. There is a small focal opacity that projects at the left lateral lung base. This may reflect a focus of atelectasis. Small area of pneumonia is not excluded but felt unlikely. No other lung opacities. No pleural effusion or pneumothorax.  Bony thorax is  demineralized but grossly intact.  IMPRESSION: Small area of opacity at the left lateral lung base. This is likely atelectasis. A small area of infiltrate is not excluded. No other evidence of acute cardiopulmonary disease.   Electronically Signed   By: Lajean Manes M.D.   On: 10/04/2013 07:53    Scheduled Meds: . famotidine  20 mg Oral BID  .  feeding supplement (RESOURCE BREEZE)  1 Container Oral TID BM  . lisinopril  20 mg Oral Daily  . piperacillin-tazobactam (ZOSYN)  IV  3.375 g Intravenous Q8H   Continuous Infusions: . dextrose 5 % and 0.45 % NaCl with KCl 10 mEq/L 75 mL/hr at 10/04/13 1100    Principal Problem:   Biliary colic Active Problems:   Hypertension   Gallstones   Other pancytopenia   Leg cramps    Time spent: 30 min    Michelle Boyd, [facility] Pager [phone]. If 7PM-7AM, please contact night-coverage at [email or web address], password [facility] 10/04/2013, 11:20 AM  LOS: 3 days

## 2013-10-05 ENCOUNTER — Encounter (HOSPITAL_COMMUNITY): Payer: Self-pay | Admitting: General Surgery

## 2013-10-05 ENCOUNTER — Inpatient Hospital Stay (HOSPITAL_COMMUNITY): Payer: Medicaid Other

## 2013-10-05 DIAGNOSIS — K810 Acute cholecystitis: Secondary | ICD-10-CM

## 2013-10-05 DIAGNOSIS — D509 Iron deficiency anemia, unspecified: Secondary | ICD-10-CM

## 2013-10-05 LAB — CBC
HCT: 30.7 % — ABNORMAL LOW (ref 36.0–46.0)
Hemoglobin: 10 g/dL — ABNORMAL LOW (ref 12.0–15.0)
MCH: 24.8 pg — ABNORMAL LOW (ref 26.0–34.0)
MCHC: 32.6 g/dL (ref 30.0–36.0)
MCV: 76.2 fL — AB (ref 78.0–100.0)
Platelets: 122 10*3/uL — ABNORMAL LOW (ref 150–400)
RBC: 4.03 MIL/uL (ref 3.87–5.11)
RDW: 16 % — AB (ref 11.5–15.5)
WBC: 5.5 10*3/uL (ref 4.0–10.5)

## 2013-10-05 LAB — BASIC METABOLIC PANEL
ANION GAP: 9 (ref 5–15)
BUN: 7 mg/dL (ref 6–23)
CO2: 24 meq/L (ref 19–32)
CREATININE: 0.85 mg/dL (ref 0.50–1.10)
Calcium: 8.6 mg/dL (ref 8.4–10.5)
Chloride: 104 mEq/L (ref 96–112)
GFR calc Af Amer: 82 mL/min — ABNORMAL LOW (ref >90)
GFR calc non Af Amer: 70 mL/min — ABNORMAL LOW (ref >90)
Glucose, Bld: 152 mg/dL — ABNORMAL HIGH (ref 70–99)
Potassium: 4.1 mEq/L (ref 3.7–5.3)
Sodium: 137 mEq/L (ref 137–147)

## 2013-10-05 LAB — TRANSFERRIN: Transferrin: 206 mg/dL — ABNORMAL LOW (ref 200–360)

## 2013-10-05 MED ORDER — DSS 100 MG PO CAPS: 100.0000 mg | ORAL_CAPSULE | Freq: Two times a day (BID) | ORAL | Status: DC

## 2013-10-05 MED ORDER — SENNA 8.6 MG PO TABS
1.0000 | ORAL_TABLET | Freq: Two times a day (BID) | ORAL | Status: DC
  Administered 2013-10-05: 8.6 mg via ORAL
  Filled 2013-10-05 (×2): qty 1

## 2013-10-05 MED ORDER — OXYCODONE-ACETAMINOPHEN 5-325 MG PO TABS: 1.0000 | ORAL_TABLET | Freq: Four times a day (QID) | ORAL | Status: DC | PRN

## 2013-10-05 MED ORDER — DOCUSATE SODIUM 100 MG PO CAPS
100.0000 mg | ORAL_CAPSULE | Freq: Two times a day (BID) | ORAL | Status: DC
  Administered 2013-10-05: 100 mg via ORAL
  Filled 2013-10-05: qty 1

## 2013-10-05 MED ORDER — SENNA 8.6 MG PO TABS: 1.0000 | ORAL_TABLET | Freq: Two times a day (BID) | ORAL | Status: DC

## 2013-10-05 MED ORDER — BISACODYL 10 MG RE SUPP: 10.0000 mg | Freq: Every day | RECTAL | Status: DC | PRN

## 2013-10-05 MED ORDER — HYDROCHLOROTHIAZIDE 25 MG PO TABS
25.0000 mg | ORAL_TABLET | Freq: Every day | ORAL | Status: DC
  Administered 2013-10-05: 25 mg via ORAL
  Filled 2013-10-05: qty 1

## 2013-10-05 MED ORDER — POLYETHYLENE GLYCOL 3350 17 G PO PACK
17.0000 g | PACK | Freq: Every day | ORAL | Status: DC | PRN
  Administered 2013-10-05: 17 g via ORAL
  Filled 2013-10-05: qty 1

## 2013-10-05 NOTE — Progress Notes (Signed)
[facility] PROGRESS NOTE  Michelle Boyd ZOX:[id number] DOB: 12/14/1950 DOA: 10/01/2013 PCP: Holland CommonsKECK, VALERIE, NP  Assessment/Plan  Gallstone pancreatitis with dilation of biliary tree on CT and gallstones in gallbladder.   Laparoscopic cholecystectomy with intraoperative cholangiogram 8/16 without complication.  Intraoperative cholangiogram demonstrated no filling defects  -  Appreciate General surgery assistance -  D/c IVF  -  Continue antiemetics -  Contniue regular diet and awaiting her to be able to eat and pass flatus  Fever, most likely secondary to cholecystitis -  UA:  11-20 WBC -  Urine culture pending -  BCx NGTD -  CXR with possible infiltrate vs. Atelectasis -  Continue zosyn day 2 -  D/c abx -  IS  -  CXR:  Patchy left airspace disease vs. atelectasis  Bilateral leg cramps from the hips to ankles, unclear etiology -  Magnesium level wnl -  Potassium within normal limits  Hypertension, blood pressure stable -  Continue lisinopril  -  Resume diuretic   Leukopenia, mild, ANC 1.4 -  HIV NR  Microcytic anemia, hemoglobin stable -  Occult stool -  Consider parasitic infection -  Iron studies c/w iron deficiency vs. Inflammation (transferrin pending), b12 342, folate 750, TSH 0.567 -  GI follow up  Thrombocytopenia, likely acute phase reactant, stable around 122  Globulin gap -  HIV NR, HCV neg, SPEP/UPEP/IFE pending  Hypoglycemia, resolved with dextrose fluids  Diet:  Per surgery Access:  PIV IVF:  off Proph:  Refusing lovenox, add SCDs  Code Status: full Family Communication:  patient and family member who was at bedside via Nepali interpreter Disposition Plan:  Pending recovery from surgery, possibly this afternoon.  Needs to meet with case management about possiblity of meals on wheels, home health aid.     Consultants:  Gen surg  Procedures:  CT abd/pelvis  Antibiotics:  none   HPI/Subjective:  Abdominal pain improving, but has not been  able to eat yet, not passing gas.  Ambulating to urinate and voiding without problem.  She is concerned she will not be able to care for herself at home.      Objective: Filed Vitals:   10/04/13 1633 10/04/13 2100 10/05/13 0500 10/05/13 0843  BP: 144/66 162/73 152/74 154/77  Pulse: 83 74 64 69  Temp: 97.7 F (36.5 C) 98.2 F (36.8 C) 98.2 F (36.8 C) 97.5 F (36.4 C)  TempSrc: Oral Oral Oral Oral  Resp: 16 16 18 20   Height:      Weight:  65.953 kg (145 lb 6.4 oz)    SpO2: 97% 95% 97% 97%    Intake/Output Summary (Last 24 hours) at 10/05/13 1059 Last data filed at 10/05/13 0700  Gross per 24 hour  Intake    950 ml  Output   1850 ml  Net   -900 ml   Filed Weights   10/02/13 0503 10/02/13 2021 10/04/13 2100  Weight: 60.374 kg (133 lb 1.6 oz) 61.598 kg (135 lb 12.8 oz) 65.953 kg (145 lb 6.4 oz)    Exam:   General:  Adult F, No acute distress, awake, alert  HEENT:  NCAT, MMM  Cardiovascular:  RRR, nl S1, S2 2/6 systolic murmur right upper sternal border, 2+ pulses, warm extremities  Respiratory:  CTAB anteriorly, no increased WOB  Abdomen:   NABS, soft, TTP in RUQ, ecchymosis and small hematoma in RUQ, TTP epigastrium without rebound or guarding, mildly distended  MSK:   Normal tone and bulk, no LEE  Neuro:  Grossly intact  Data Reviewed: Basic Metabolic Panel:  Recent Labs Lab 10/01/13 1850 10/02/13 0610 10/03/13 0440 10/05/13 0510  NA 134* 137 141 137  K 4.0 3.9 4.0 4.1  CL 98 102 108 104  CO2 22 23 21 24   GLUCOSE 92 96 67* 152*  BUN 13 10 10 7   CREATININE 0.88 0.83 0.78 0.85  CALCIUM 9.1 7.8* 8.4 8.6  MG  --   --  1.8  --    Liver Function Tests:  Recent Labs Lab 10/01/13 1850 10/02/13 0610 10/03/13 0440  AST 31 43* 27  ALT 25 26 23   ALKPHOS 107 96 95  BILITOT 0.4 0.6 0.6  PROT 7.7 6.7 6.5  ALBUMIN 3.6 3.2* 3.0*    Recent Labs Lab 10/01/13 1850 10/03/13 0440  LIPASE 115* 24   No results found for this basename: AMMONIA,  in the  last 168 hours CBC:  Recent Labs Lab 10/01/13 1850 10/02/13 0610 10/03/13 0440 10/05/13 0510  WBC 3.8* 3.9* 2.9* 5.5  NEUTROABS 1.4*  --   --   --   HGB 11.4* 10.8* 10.5* 10.0*  HCT 34.5* 33.3* 33.3* 30.7*  MCV 76.0* 76.6* 77.6* 76.2*  PLT 139* 121* 124* 122*   Cardiac Enzymes: No results found for this basename: CKTOTAL, CKMB, CKMBINDEX, TROPONINI,  in the last 168 hours BNP (last 3 results) No results found for this basename: PROBNP,  in the last 8760 hours CBG: No results found for this basename: GLUCAP,  in the last 168 hours  Recent Results (from the past 240 hour(s))  SURGICAL PCR SCREEN     Status: None   Collection Time    10/03/13  5:01 AM      Result Value Ref Range Status   MRSA, PCR NEGATIVE  NEGATIVE Final   Staphylococcus aureus NEGATIVE  NEGATIVE Final   Comment:            The Xpert SA Assay (FDA     approved for NASAL specimens     in patients over 82 years of age),     is one component of     a comprehensive surveillance     program.  Test performance has     been validated by [employer] for patients greater     than or equal to 61 year old.     It is not intended     to diagnose infection nor to     guide or monitor treatment.  CULTURE, BLOOD (ROUTINE X 2)     Status: None   Collection Time    10/03/13 11:07 PM      Result Value Ref Range Status   Specimen Description BLOOD LEFT ARM   Final   Special Requests BOTTLES DRAWN AEROBIC AND ANAEROBIC 10CC EACH   Final   Culture  Setup Time     Final   Value: 10/04/2013 12:23     Performed at [facility]   Culture     Final   Value:        BLOOD CULTURE RECEIVED NO GROWTH TO DATE CULTURE WILL BE HELD FOR 5 DAYS BEFORE ISSUING A FINAL NEGATIVE REPORT     Performed at [facility]   Report Status PENDING   Incomplete  CULTURE, BLOOD (ROUTINE X 2)     Status: None   Collection Time    10/03/13 11:13 PM      Result Value Ref Range Status  Specimen Description BLOOD LEFT HAND    Final   Special Requests BOTTLES DRAWN AEROBIC AND ANAEROBIC 10CC    Final   Culture  Setup Time     Final   Value: 10/04/2013 12:23     Performed at [facility]   Culture     Final   Value:        BLOOD CULTURE RECEIVED NO GROWTH TO DATE CULTURE WILL BE HELD FOR 5 DAYS BEFORE ISSUING A FINAL NEGATIVE REPORT     Performed at [facility]   Report Status PENDING   Incomplete     Studies: Dg Cholangiogram Operative  10/04/2013   CLINICAL DATA:  Cholelithiasis  EXAM: INTRAOPERATIVE CHOLANGIOGRAM  TECHNIQUE: Cholangiographic images from the C-arm fluoroscopic device were submitted for interpretation post-operatively. Please see the procedural report for the amount of contrast and the fluoroscopy time utilized.  COMPARISON:  None.  FINDINGS: Contrast fills the biliary tree without evidence of common bile duct filling defects.  IMPRESSION: Patent biliary tree without evidence of common bile duct stones.   Electronically Signed   By: Maryclare Bean M.D.   On: 10/04/2013 09:29   Dg Chest Port 1 View  10/05/2013   CLINICAL DATA:  Infiltrate  EXAM: PORTABLE CHEST - 1 VIEW  COMPARISON:  October 04, 2013  FINDINGS: There is patchy consolidation in the left base. Lungs elsewhere clear. Heart is mildly enlarged with pulmonary vascularity within normal limits. No adenopathy. There is atherosclerotic change in the aorta.  IMPRESSION: Slight increase in patchy left base atelectasis. Right lung clear. Note that the degree of inspiration is shallow.   Electronically Signed   By: Bretta Bang M.D.   On: 10/05/2013 07:29   Dg Chest Port 1 View  10/04/2013   CLINICAL DATA:  New onset fever.  Preop for surgery.  Chest pain.  EXAM: PORTABLE CHEST - 1 VIEW  COMPARISON:  08/31/2013.  FINDINGS: Cardiac silhouette is normal in size. No mediastinal or hilar masses.  Mild interstitial prominence, stable. There is a small focal opacity that projects at the left lateral lung base. This may reflect a focus of  atelectasis. Small area of pneumonia is not excluded but felt unlikely. No other lung opacities. No pleural effusion or pneumothorax.  Bony thorax is demineralized but grossly intact.  IMPRESSION: Small area of opacity at the left lateral lung base. This is likely atelectasis. A small area of infiltrate is not excluded. No other evidence of acute cardiopulmonary disease.   Electronically Signed   By: Amie Portland M.D.   On: 10/04/2013 07:53    Scheduled Meds: . famotidine  20 mg Oral BID  . feeding supplement (RESOURCE BREEZE)  1 Container Oral TID BM  . lisinopril  20 mg Oral Daily   Continuous Infusions:    Principal Problem:   Biliary colic Active Problems:   Hypertension   Gallstones   Other pancytopenia   Leg cramps   Cholecystitis, acute    Time spent: 30 min    Layna Roeper, [facility]  [facility] Pager [phone]. If 7PM-7AM, please contact night-coverage at [email or web address], password [facility] 10/05/2013, 10:59 AM  LOS: 4 days

## 2013-10-05 NOTE — Progress Notes (Signed)
1 Day Post-Op  Subjective: Granddaughters are in the room with her.  She had some supper last PM, not much. She says her pain is better.  Family says she has been able to void.  Objective: Vital signs in last 24 hours: Temp:  [97.5 F (36.4 C)-98.2 F (36.8 C)] 98.2 F (36.8 C) (08/17 0500) Pulse Rate:  [64-83] 64 (08/17 0500) Resp:  [9-18] 18 (08/17 0500) BP: (140-188)/(66-94) 152/74 mmHg (08/17 0500) SpO2:  [92 %-99 %] 97 % (08/17 0500) Weight:  [65.953 kg (145 lb 6.4 oz)] 65.953 kg (145 lb 6.4 oz) (08/16 2100) Last BM Date:  (PTA) Nothing po recorded. Regular diet Afebrile, VSS BMP shows elevated glucose Intake/Output from previous day: 08/16 0701 - 08/17 0700 In: 1651 [I.V.:1601; IV Piggyback:50] Out: 1960 [Urine:1950; Blood:10] Intake/Output this shift:    General appearance: alert, cooperative and no distress GI: soft sore, few Bs, no distension.  Port sites look fine.  Lab Results:   Recent Labs  10/03/13 0440 10/05/13 0510  WBC 2.9* 5.5  HGB 10.5* 10.0*  HCT 33.3* 30.7*  PLT 124* 122*    BMET  Recent Labs  10/03/13 0440 10/05/13 0510  NA 141 137  K 4.0 4.1  CL 108 104  CO2 21 24  GLUCOSE 67* 152*  BUN 10 7  CREATININE 0.78 0.85  CALCIUM 8.4 8.6   PT/INR No results found for this basename: LABPROT, INR,  in the last 72 hours   Recent Labs Lab 10/01/13 1850 10/02/13 0610 10/03/13 0440  AST 31 43* 27  ALT 25 26 23   ALKPHOS 107 96 95  BILITOT 0.4 0.6 0.6  PROT 7.7 6.7 6.5  ALBUMIN 3.6 3.2* 3.0*     Lipase     Component Value Date/Time   LIPASE 24 10/03/2013 0440     Studies/Results: Dg Cholangiogram Operative  10/04/2013   CLINICAL DATA:  Cholelithiasis  EXAM: INTRAOPERATIVE CHOLANGIOGRAM  TECHNIQUE: Cholangiographic images from the C-arm fluoroscopic device were submitted for interpretation post-operatively. Please see the procedural report for the amount of contrast and the fluoroscopy time utilized.  COMPARISON:  None.  FINDINGS:  Contrast fills the biliary tree without evidence of common bile duct filling defects.  IMPRESSION: Patent biliary tree without evidence of common bile duct stones.   Electronically Signed   By: Michelle Boyd M.D.   On: 10/04/2013 09:29   Dg Chest Port 1 View  10/04/2013   CLINICAL DATA:  New onset fever.  Preop for surgery.  Chest pain.  EXAM: PORTABLE CHEST - 1 VIEW  COMPARISON:  08/31/2013.  FINDINGS: Cardiac silhouette is normal in size. No mediastinal or hilar masses.  Mild interstitial prominence, stable. There is a small focal opacity that projects at the left lateral lung base. This may reflect a focus of atelectasis. Small area of pneumonia is not excluded but felt unlikely. No other lung opacities. No pleural effusion or pneumothorax.  Bony thorax is demineralized but grossly intact.  IMPRESSION: Small area of opacity at the left lateral lung base. This is likely atelectasis. A small area of infiltrate is not excluded. No other evidence of acute cardiopulmonary disease.   Electronically Signed   By: Michelle Boyd M.D.   On: 10/04/2013 07:53    Medications: . famotidine  20 mg Oral BID  . feeding supplement (RESOURCE BREEZE)  1 Container Oral TID BM  . lisinopril  20 mg Oral Daily  . piperacillin-tazobactam (ZOSYN)  IV  3.375 g Intravenous Q8H  Assessment/Plan  1.  Gallstone pancreatitis with cholecystitis   LAPAROSCOPIC CHOLECYSTECTOMY WITH INTRAOPERATIVE CHOLANGIOGRAM, Michelle FillerArmando  Ramirez, MD, 08/04/13.  2.  Hypertension    Plan:  We can stop antibiotics and and she can go home when she can eat, drink, walk and void.  I have put information in AVS about follow up and post op care.  I also discussed with family, and answered questions. If there are any questions give me a call.   Michelle Boyd  [facility] [phone]  10/05/2013 7:40 AM     LOS: 4 days    Michelle Boyd 10/05/2013

## 2013-10-05 NOTE — Discharge Summary (Addendum)
Physician Discharge Summary  Marquelle Balow XKG:[id number] DOB: Apr 30, 1950 DOA: 10/01/2013  PCP: Chari Manning, NP  Admit date: 10/01/2013 Discharge date: 10/05/2013  Recommendations for Outpatient Follow-up:  1.  F/u with general surgery in 2 weeks for reevaluation 2.  Met with case management for information about wheels on wheels and home assistance 3.  F/u with primary care doctor for further evaluation of pancytopenia.  Recommend hematology/oncology referral.  Follow up pending blood cultures, urine culture, and pending SPEP/UPEP/IFE.  Please repeat iron studies in a few weeks.  Occult stool as outpatient and if positive consider GI referral.    Discharge Diagnoses:  Principal Problem:   Cholecystitis, acute Active Problems:   Hypertension   Gallstones   Gallstone pancreatitis   Biliary colic   Other pancytopenia   Leg cramps   Discharge Condition: stable, improved  Diet recommendation:  Low salt  Wt Readings from Last 3 Encounters:  10/04/13 65.953 kg (145 lb 6.4 oz)  10/04/13 65.953 kg (145 lb 6.4 oz)  09/09/13 61.236 kg (135 lb)    History of present illness:   63 yo F with hx of gallstones and hypertension who presented with abdominal pain, nausea, and vomiting.  She was found to have a gallstone as an outpatient a few weeks prior to presentation and had already been referred to surgery for elective lap- choe.    Hospital Course:   Gallstone pancreatitis with dilation of biliary tree on CT and gallstones in gallbladder. Laparoscopic cholecystectomy with intraoperative cholangiogram 7/02 without complication. Intraoperative cholangiogram demonstrated no filling defects.  She was able to tolerate a regular diet post-operatively and was voiding without problem.  Recommend that she call the surgery office if she develops increasing abdominal distension and pain or inability to pass flatus or stools.    Acute cholecystitis.  Several days into hospitalization, she developed  worsening abdomianl pain and new fever.  UA with 11-20 WBC.  CXR with most likely atelectasis vs. Less likely infiltrate given no respiratory symptoms.  She was started on zosyn for probable acute cholecystitis.  Her gallbladder was removed on 8/16 and per surgery, she likely had cholecystitis based on exam.  She received zosyn from onset of fever until 24 hours after removal of gallbladder.    Bilateral leg cramps from the hips to ankles, unclear etiology.  Potassium and magnesium wnl.  Resolved spontaneously.  Hypertension, blood pressures stable, however, her diuretic was initially held due ot dehydration.  She may resume her blood pressure medications at discharge.    Leukopenia, mild, ANC 1.4, HIV was nonreactive.  Microcytic anemia, hemoglobin trended down somewhat post-operatively but she did not require blood transfusion.  Occult stool was not obtained.  Iron studies c/w inflammation although cannot exclude possibility of iron deficiency.  Vitamin b12 342, folate 750, TSH 0.567.  Consider repeating iron studies after healed from infection/surgery AND get occult stool.  Referral to GI if needed.    Thrombocytopenia, likely acute phase reactant, stable around 122.  Repeat in 1-2 weeks by PCP as outpatient.    Globulin gap, HIV NR, HCV neg, SPEP/UPEP/IFE pending.  Mild hypoglycemia because NPO, resolved with dextrose fluids.     Consultants:  Gen surg Procedures:  CT abd/pelvis Antibiotics:  none    Discharge Exam: Filed Vitals:   10/05/13 0843  BP: 154/77  Pulse: 69  Temp: 97.5 F (36.4 C)  Resp: 20   Filed Vitals:   10/04/13 1633 10/04/13 2100 10/05/13 0500 10/05/13 0843  BP: 144/66 162/73 152/74  154/77  Pulse: 83 74 64 69  Temp: 97.7 F (36.5 C) 98.2 F (36.8 C) 98.2 F (36.8 C) 97.5 F (36.4 C)  TempSrc: Oral Oral Oral Oral  Resp: _0 Height:      Weight:  65.953 kg (145 lb 6.4 oz)    SpO2: 97% 95% 97% 97%    General: Adult F, No acute distress,  awake, alert  HEENT: NCAT, MMM  Cardiovascular: RRR, nl S1, S2 2/6 systolic murmur right upper sternal border, 2+ pulses, warm extremities  Respiratory: CTAB anteriorly, no increased WOB  Abdomen: NABS, soft, TTP in RUQ, ecchymosis and small hematoma in RUQ, TTP epigastrium without rebound or guarding, mildly distended  MSK: Normal tone and bulk, no LEE  Neuro: Grossly intact   Discharge Instructions      Discharge Instructions   Call MD for:  difficulty breathing, headache or visual disturbances    Complete by:  As directed      Call MD for:  extreme fatigue    Complete by:  As directed      Call MD for:  hives    Complete by:  As directed      Call MD for:  persistant dizziness or light-headedness    Complete by:  As directed      Call MD for:  persistant nausea and vomiting    Complete by:  As directed      Call MD for:  severe uncontrolled pain    Complete by:  As directed      Call MD for:  temperature >100.4    Complete by:  As directed      Diet - low sodium heart healthy    Complete by:  As directed      Discharge instructions    Complete by:  As directed   You were hospitalized with gallstones which caused infection of the gallbladder and pancreatitis.  Please drink plenty of fluids.  If you have worsening abdominal pain, swelling, nausea, vomiting, or inability to tolerate liquids, please return to the hospital right away.  Please follow up with your primary care doctor in 1-2 weeks or sooner as needed to review your bloodwork.     Increase activity slowly    Complete by:  As directed             Medication List         DSS 100 MG Caps  Take 100 mg by mouth 2 (two) times daily.     ergocalciferol 50000 UNITS capsule  Commonly known as:  VITAMIN D2  Take 50,000 Units by mouth once a week.     famotidine 20 MG tablet  Commonly known as:  PEPCID  Take 20 mg by mouth 2 (two) times daily.     lisinopril-hydrochlorothiazide 20-25 MG per tablet  Commonly known  as:  PRINZIDE,ZESTORETIC  Take 1 tablet by mouth daily.     oxyCODONE-acetaminophen 5-325 MG per tablet  Commonly known as:  PERCOCET/ROXICET  Take 1 tablet by mouth every 6 (six) hours as needed for moderate pain or severe pain.     senna 8.6 MG Tabs tablet  Commonly known as:  SENOKOT  Take 1 tablet (8.6 mg total) by mouth 2 (two) times daily.     traMADol 50 MG tablet  Commonly known as:  ULTRAM  Take 50 mg by mouth 2 (two) times daily as needed for moderate pain.       Follow-up Information  Follow up with Reyes Ivan, MD. Schedule an appointment as soon as possible for a visit in 2 weeks. (Make an appointment in 2-3 weeks.)    Specialty:  General Surgery   Contact information:   [address] [place] [address] [phone]       Please follow up. (family to call "Meals on Wheels" [phone])       The results of significant diagnostics from this hospitalization (including imaging, microbiology, ancillary and laboratory) are listed below for reference.    Significant Diagnostic Studies: Dg Cholangiogram Operative  10/04/2013   CLINICAL DATA:  Cholelithiasis  EXAM: INTRAOPERATIVE CHOLANGIOGRAM  TECHNIQUE: Cholangiographic images from the C-arm fluoroscopic device were submitted for interpretation post-operatively. Please see the procedural report for the amount of contrast and the fluoroscopy time utilized.  COMPARISON:  None.  FINDINGS: Contrast fills the biliary tree without evidence of common bile duct filling defects.  IMPRESSION: Patent biliary tree without evidence of common bile duct stones.   Electronically Signed   By: Maryclare Bean M.D.   On: 10/04/2013 09:29   Ct Abdomen Pelvis W Contrast  10/02/2013   CLINICAL DATA:  Right lower quadrant abdominal pain. Elevated lipase.  EXAM: CT ABDOMEN AND PELVIS WITH CONTRAST  TECHNIQUE: Multidetector CT imaging of the abdomen and pelvis was performed using the standard protocol following bolus administration  of intravenous contrast.  CONTRAST:  33m OMNIPAQUE IOHEXOL 300 MG/ML SOLN, 1021mOMNIPAQUE IOHEXOL 300 MG/ML SOLN  COMPARISON:  Abdominal ultrasound performed 08/31/2013  FINDINGS: Minimal bibasilar atelectasis is noted.  There is mild gallbladder wall thickening and question of trace pericholecystic fluid. The common hepatic duct is dilated to 1.0 cm in maximum diameter, and there is mild prominence of the intrahepatic biliary ducts. This raises concern for distal obstruction, though no definite mass or stone is characterized on this study.  The liver is otherwise unremarkable in appearance. The spleen is within normal limits. The pancreas is otherwise grossly unremarkable. The adrenal glands are normal in appearance.  The kidneys are unremarkable in appearance. There is no evidence of hydronephrosis. No renal or ureteral stones are seen. No perinephric stranding is appreciated.  No free fluid is identified. The small bowel is unremarkable in appearance. The stomach is within normal limits. No acute vascular abnormalities are seen.  The appendix is normal in caliber, without evidence for appendicitis. The colon is unremarkable in appearance.  The bladder is moderately distended and grossly unremarkable. The uterus is grossly unremarkable. The ovaries are relatively symmetric. No suspicious adnexal masses are seen. No inguinal lymphadenopathy is seen.  No acute osseous abnormalities are identified. There is mild chronic loss of height at L3.  IMPRESSION: 1. Dilatation of the common hepatic duct to 1.0 cm, with mild prominence of the intrahepatic biliary ducts. This raises concern for distal obstruction, though no definite mass or stone is characterized on this study. MRCP or ERCP would be helpful for further evaluation, when and as deemed clinically appropriate. 2. Mild gallbladder wall thickening and question of trace pericholecystic fluid. This could reflect the obstruction described above, though mild  cholecystitis cannot be entirely excluded.   Electronically Signed   By: JeGarald Balding.D.   On: 10/02/2013 02:26   Dg Chest Port 1 View  10/05/2013   CLINICAL DATA:  Infiltrate  EXAM: PORTABLE CHEST - 1 VIEW  COMPARISON:  October 04, 2013  FINDINGS: There is patchy consolidation in the left base. Lungs elsewhere clear. Heart is mildly enlarged with pulmonary vascularity  within normal limits. No adenopathy. There is atherosclerotic change in the aorta.  IMPRESSION: Slight increase in patchy left base atelectasis. Right lung clear. Note that the degree of inspiration is shallow.   Electronically Signed   By: Lowella Grip M.D.   On: 10/05/2013 07:29   Dg Chest Port 1 View  10/04/2013   CLINICAL DATA:  New onset fever.  Preop for surgery.  Chest pain.  EXAM: PORTABLE CHEST - 1 VIEW  COMPARISON:  08/31/2013.  FINDINGS: Cardiac silhouette is normal in size. No mediastinal or hilar masses.  Mild interstitial prominence, stable. There is a small focal opacity that projects at the left lateral lung base. This may reflect a focus of atelectasis. Small area of pneumonia is not excluded but felt unlikely. No other lung opacities. No pleural effusion or pneumothorax.  Bony thorax is demineralized but grossly intact.  IMPRESSION: Small area of opacity at the left lateral lung base. This is likely atelectasis. A small area of infiltrate is not excluded. No other evidence of acute cardiopulmonary disease.   Electronically Signed   By: Lajean Manes M.D.   On: 10/04/2013 07:53    Microbiology: Recent Results (from the past 240 hour(s))  SURGICAL PCR SCREEN     Status: None   Collection Time    10/03/13  5:01 AM      Result Value Ref Range Status   MRSA, PCR NEGATIVE  NEGATIVE Final   Staphylococcus aureus NEGATIVE  NEGATIVE Final   Comment:            The Xpert SA Assay (FDA     approved for NASAL specimens     in patients over 42 years of age),     is one component of     a comprehensive surveillance      program.  Test performance has     been validated by [employer] for patients greater     than or equal to 11 year old.     It is not intended     to diagnose infection nor to     guide or monitor treatment.  CULTURE, BLOOD (ROUTINE X 2)     Status: None   Collection Time    10/03/13 11:07 PM      Result Value Ref Range Status   Specimen Description BLOOD LEFT ARM   Final   Special Requests BOTTLES DRAWN AEROBIC AND ANAEROBIC 10CC EACH   Final   Culture  Setup Time     Final   Value: 10/04/2013 12:23     Performed at [facility]   Culture     Final   Value:        BLOOD CULTURE RECEIVED NO GROWTH TO DATE CULTURE WILL BE HELD FOR 5 DAYS BEFORE ISSUING A FINAL NEGATIVE REPORT     Performed at [facility]   Report Status PENDING   Incomplete  CULTURE, BLOOD (ROUTINE X 2)     Status: None   Collection Time    10/03/13 11:13 PM      Result Value Ref Range Status   Specimen Description BLOOD LEFT HAND   Final   Special Requests BOTTLES DRAWN AEROBIC AND ANAEROBIC 10CC    Final   Culture  Setup Time     Final   Value: 10/04/2013 12:23     Performed at [facility]   Culture     Final   Value:  BLOOD CULTURE RECEIVED NO GROWTH TO DATE CULTURE WILL BE HELD FOR 5 DAYS BEFORE ISSUING A FINAL NEGATIVE REPORT     Performed at [facility]   Report Status PENDING   Incomplete     Labs: Basic Metabolic Panel:  Recent Labs Lab 10/01/13 1850 10/02/13 0610 10/03/13 0440 10/05/13 0510  NA 134* 137 141 137  K 4.0 3.9 4.0 4.1  CL 98 102 108 104  CO2 _0 GLUCOSE 92 96 67* 152*  BUN _1 CREATININE 0.88 0.83 0.78 0.85  CALCIUM 9.1 7.8* 8.4 8.6  MG  --   --  1.8  --    Liver Function Tests:  Recent Labs Lab 10/01/13 1850 10/02/13 0610 10/03/13 0440  AST 31 43* 27  ALT _2 ALKPHOS 107 96 95  BILITOT 0.4 0.6 0.6  PROT 7.7 6.7 6.5  ALBUMIN 3.6 3.2* 3.0*    Recent Labs Lab 10/01/13 1850 10/03/13 0440   LIPASE 115* 24   No results found for this basename: AMMONIA,  in the last 168 hours CBC:  Recent Labs Lab 10/01/13 1850 10/02/13 0610 10/03/13 0440 10/05/13 0510  WBC 3.8* 3.9* 2.9* 5.5  NEUTROABS 1.4*  --   --   --   HGB 11.4* 10.8* 10.5* 10.0*  HCT 34.5* 33.3* 33.3* 30.7*  MCV 76.0* 76.6* 77.6* 76.2*  PLT 139* 121* 124* 122*   Cardiac Enzymes: No results found for this basename: CKTOTAL, CKMB, CKMBINDEX, TROPONINI,  in the last 168 hours BNP: BNP (last 3 results) No results found for this basename: PROBNP,  in the last 8760 hours CBG: No results found for this basename: GLUCAP,  in the last 168 hours  Time coordinating discharge: 45 minutes  Signed:  Toluwanimi Radebaugh  [facility] 10/05/2013, 2:52 PM

## 2013-10-05 NOTE — Discharge Instructions (Signed)
[facility] ______[facility] °LAPAROSCOPIC SURGERY: POST OP INSTRUCTIONS °Always review your discharge instruction sheet given to you by the facility where your surgery was performed. °IF YOU HAVE DISABILITY OR FAMILY LEAVE FORMS, YOU MUST BRING THEM TO THE OFFICE FOR PROCESSING.   °DO NOT GIVE THEM TO YOUR DOCTOR. ° °1. A prescription for pain medication may be given to you upon discharge.  Take your pain medication as prescribed, if needed.  If narcotic pain medicine is not needed, then you may take acetaminophen (Tylenol) or ibuprofen (Advil) as needed. °2. Take your usually prescribed medications unless otherwise directed. °3. If you need a refill on your pain medication, please contact your pharmacy.  They will contact our office to request authorization. Prescriptions will not be filled after 5pm or on week-ends. °4. You should follow a light diet the first few days after arrival home, such as soup and crackers, etc.  Be sure to include lots of fluids daily. °5. Most patients will experience some swelling and bruising in the area of the incisions.  Ice packs will help.  Swelling and bruising can take several days to resolve.  °6. It is common to experience some constipation if taking pain medication after surgery.  Increasing fluid intake and taking a stool softener (such as Colace) will usually help or prevent this problem from occurring.  A mild laxative (Milk of Magnesia or Miralax) should be taken according to package instructions if there are no bowel movements after 48 hours. °7. Unless discharge instructions indicate otherwise, you may remove your bandages 24-48 hours after surgery, and you may shower at that time.  You may have steri-strips (small skin tapes) in place directly over the incision.  These strips should be left on the skin for 7-10 days.  If your surgeon used skin glue on the incision, you may shower in 24 hours.  The glue will flake off over the next 2-3 weeks.  Any sutures or  staples will be removed at the office during your follow-up visit. °8. ACTIVITIES:  You may resume regular (light) daily activities beginning the next day--such as daily self-care, walking, climbing stairs--gradually increasing activities as tolerated.  You may have sexual intercourse when it is comfortable.  Refrain from any heavy lifting or straining until approved by your doctor. °a. You may drive when you are no longer taking prescription pain medication, you can comfortably wear a seatbelt, and you can safely maneuver your car and apply brakes. °b. RETURN TO WORK:  __________________________________________________________ °9. You should see your doctor in the office for a follow-up appointment approximately 2-3 weeks after your surgery.  Make sure that you call for this appointment within a day or two after you arrive home to insure a convenient appointment time. °10. OTHER INSTRUCTIONS: __________________________________________________________________________________________________________________________ __________________________________________________________________________________________________________________________ °WHEN TO CALL YOUR DOCTOR: °1. Fever over 101.0 °2. Inability to urinate °3. Continued bleeding from incision. °4. Increased pain, redness, or drainage from the incision. °5. Increasing abdominal pain ° °The clinic staff is available to answer your questions during regular business hours.  Please don’t hesitate to call and ask to speak to one of the nurses for clinical concerns.  If you have a medical emergency, go to the nearest emergency room or call 911.  A surgeon from [facility] is always on call at the hospital. °[address], [address], [place], [place]  [address] ? [address] [address], [place], [place]   [address] °[phone] ? [phone] ? FAX [phone] °Web site:   [email or web address] °Laparoscopic Cholecystectomy, Care After °Refer to this  sheet in the next few weeks. These instructions provide you with information on caring for yourself after your procedure. Your health care provider may also give you more specific instructions. Your treatment has been planned according to current medical practices, but problems sometimes occur. Call your health care provider if you have any problems or questions after your procedure. °WHAT TO EXPECT AFTER THE PROCEDURE °After your procedure, it is typical to have the following: °· Pain at your incision sites. You will be given pain medicines to control the pain. °· Mild nausea or vomiting. This should improve after the first 24 hours. °· Bloating and possibly shoulder pain from the gas used during the procedure. This will improve after the first 24 hours. °HOME CARE INSTRUCTIONS  °· Change bandages (dressings) as directed by your health care provider. °· Keep the wound dry and clean. You may wash the wound gently with soap and water. Gently blot or dab the area dry. °· Do not take baths or use swimming pools or hot tubs for 2 weeks or until your health care provider approves. °· Only take over-the-counter or prescription medicines as directed by your health care provider. °· Continue your normal diet as directed by your health care provider. °· Do not lift anything heavier than 10 pounds (4.5 kg) until your health care provider approves. °· Do not play contact sports for 1 week or until your health care provider approves. °SEEK MEDICAL CARE IF:  °· You have redness, swelling, or increasing pain in the wound. °· You notice yellowish-white fluid (pus) coming from the wound. °· You have drainage from the wound that lasts longer than 1 day. °· You notice a bad smell coming from the wound or dressing. °· Your surgical cuts (incisions) break open. °SEEK IMMEDIATE MEDICAL CARE IF:  °· You develop a rash. °· You have difficulty breathing. °· You have chest pain. °· You have a fever. °· You have increasing pain in the  shoulders (shoulder strap areas). °· You have dizzy episodes or faint while standing. °· You have severe abdominal pain. °· You feel sick to your stomach (nauseous) or throw up (vomit) and this lasts for more than 1 day. °Document Released: 02/05/2005 Document Revised: 11/26/2012 Document Reviewed: 09/17/2012 °ExitCare® Patient Information ©2015 [employer]. This information is not intended to replace advice given to you by your health care provider. Make sure you discuss any questions you have with your health care provider. ° °

## 2013-10-05 NOTE — Progress Notes (Signed)
Wilmon ArmsMatthew K. Corliss Skainssuei, MD, [facility]  General[facility] Trauma Surgery  10/05/2013 8:42 AM

## 2013-10-05 NOTE — Care Management Note (Signed)
CARE MANAGEMENT NOTE 10/05/2013  Patient:  Michelle Boyd, Michelle Boyd   Account Number:  [id number]  Date Initiated:  10/05/2013  Documentation initiated by:  Jheremy Boger  Subjective/Objective Assessment:   CM following for progression and d/c planning.     Action/Plan:   Consult for home services, help to prepare meals. Met with pt and granddaughter. Pt up in room walking and exercising legs. Lives with family who work long hours. Family give phone # for meals on wheels and application for PCS.   Anticipated DC Date:  10/05/2013   Anticipated DC Plan:  HOME/SELF CARE         Choice offered to / List presented to:             Status of service:   Medicare Important Message given?  NO (If response is "NO", the following Medicare IM given date fields will be blank) Date Medicare IM given:   Medicare IM given by:   Date Additional Medicare IM given:   Additional Medicare IM given by:    Discharge Disposition:    Per UR Regulation:    If discussed at Long Length of Stay Meetings, dates discussed:    Comments:  10/06/2015 Met with pt and two granddaughters who speak Engish. 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 No hx of medical issues that would prevent pt from preparing her own food.  Family given phone number for "meals on wheels" . they would need to make contact as pt speaks very little [place]. Family also given application for [place] thru [facility] Medicaid, to take to the pt PCP to be completed.  No skilled needs identified.   Jasmine Pang RN MPH, case manager, [phone]

## 2013-10-06 LAB — PROTEIN ELECTROPHORESIS, SERUM
ALPHA-1-GLOBULIN: 5.1 % — AB (ref 2.9–4.9)
ALPHA-2-GLOBULIN: 8.8 % (ref 7.1–11.8)
Albumin ELP: 51.9 % — ABNORMAL LOW (ref 55.8–66.1)
Beta 2: 4.5 % (ref 3.2–6.5)
Beta Globulin: 6.4 % (ref 4.7–7.2)
Gamma Globulin: 23.3 % — ABNORMAL HIGH (ref 11.1–18.8)
M-Spike, %: NOT DETECTED g/dL
TOTAL PROTEIN ELP: 6.1 g/dL (ref 6.0–8.3)

## 2013-10-06 LAB — URINE CULTURE
Colony Count: NO GROWTH
Culture: NO GROWTH

## 2013-10-06 LAB — UIFE/LIGHT CHAINS/TP QN, 24-HR UR
ALBUMIN, U: DETECTED
Alpha 1, Urine: DETECTED — AB
Alpha 2, Urine: DETECTED — AB
Beta, Urine: DETECTED — AB
FREE KAPPA/LAMBDA RATIO: 7.42 ratio (ref 2.04–10.37)
FREE LAMBDA LT CHAINS, UR: 1.06 mg/dL — AB (ref 0.02–0.67)
Free Kappa Lt Chains,Ur: 7.87 mg/dL — ABNORMAL HIGH (ref 0.14–2.42)
Gamma Globulin, Urine: DETECTED — AB
TOTAL PROTEIN, URINE-UPE24: 9.6 mg/dL

## 2013-10-10 LAB — CULTURE, BLOOD (ROUTINE X 2)
CULTURE: NO GROWTH
Culture: NO GROWTH

## 2013-10-19 ENCOUNTER — Encounter (HOSPITAL_COMMUNITY): Payer: Self-pay | Admitting: Emergency Medicine

## 2013-10-19 ENCOUNTER — Emergency Department (HOSPITAL_COMMUNITY)
Admission: EM | Admit: 2013-10-19 | Discharge: 2013-10-19 | Disposition: A | Discharge status: Home / Self Care | Payer: Medicaid Other | Attending: Emergency Medicine | Admitting: Emergency Medicine

## 2013-10-19 DIAGNOSIS — G8929 Other chronic pain: Secondary | ICD-10-CM | POA: Diagnosis not present

## 2013-10-19 DIAGNOSIS — Z9889 Other specified postprocedural states: Secondary | ICD-10-CM | POA: Diagnosis not present

## 2013-10-19 DIAGNOSIS — Z79899 Other long term (current) drug therapy: Secondary | ICD-10-CM | POA: Diagnosis not present

## 2013-10-19 DIAGNOSIS — R109 Unspecified abdominal pain: Secondary | ICD-10-CM | POA: Insufficient documentation

## 2013-10-19 DIAGNOSIS — I10 Essential (primary) hypertension: Secondary | ICD-10-CM | POA: Insufficient documentation

## 2013-10-19 DIAGNOSIS — Z09 Encounter for follow-up examination after completed treatment for conditions other than malignant neoplasm: Secondary | ICD-10-CM | POA: Insufficient documentation

## 2013-10-19 DIAGNOSIS — K219 Gastro-esophageal reflux disease without esophagitis: Secondary | ICD-10-CM | POA: Diagnosis not present

## 2013-10-19 NOTE — ED Provider Notes (Signed)
CSN: [id number]     Arrival date & time 10/19/13  1217 History   First MD Initiated Contact with Patient 10/19/13 1229     Chief Complaint  Patient presents with  . Abdominal Pain      Patient is a 63 y.o. female presenting with abdominal pain. The history is limited by a language barrier.  Abdominal Pain Pain radiates to:  Does not radiate Pain severity:  Mild Duration:  2 weeks Timing:  Intermittent Context comment:  Post op Associated symptoms: no anorexia, no diarrhea, no dysuria, no fever, no nausea and no vomiting    Recent admission for gallstone pancreatitis with cholecystitis, lap chole on 10/04/13. Told to follow up in 2 weeks with Dr. Axel Filler.  Presents to ED today for post op follow up appointment with surgeon.  She did not contact Dr. Derrell Lolling for follow up and was confused on discharge instructions regarding follow up.    Past Medical History  Diagnosis Date  . Hypertension   . Chronic knee pain   . GERD (gastroesophageal reflux disease)   . Gallstone pancreatitis   . Cholelithiasis    Past Surgical History  Procedure Laterality Date  . External ear surgery      right mastoidectomy  . Cholecystectomy N/A 10/04/2013    Procedure: LAPAROSCOPIC CHOLECYSTECTOMY WITH INTRAOPERATIVE CHOLANGIOGRAM;  Surgeon: Axel Filler, MD;  Location: [facility] OR;  Service: General;  Laterality: N/A;   No family history on file. History  Substance Use Topics  . Smoking status: Never Smoker   . Smokeless tobacco: Not on file  . Alcohol Use: No   OB History   Grav Para Term Preterm Abortions TAB SAB Ect Mult Living                 Review of Systems  Constitutional: Negative for fever.  Gastrointestinal: Positive for abdominal pain. Negative for nausea, vomiting, diarrhea and anorexia.  Genitourinary: Negative for dysuria.      Allergies  Review of patient's allergies indicates no known allergies.  Home Medications   Prior to Admission medications   Medication Sig  Start Date End Date Taking? Authorizing Provider  docusate sodium 100 MG CAPS Take 100 mg by mouth 2 (two) times daily. 10/05/13   Renae Fickle, MD  ergocalciferol (VITAMIN D2) 50000 UNITS capsule Take 50,000 Units by mouth once a week.    Historical Provider, MD  famotidine (PEPCID) 20 MG tablet Take 20 mg by mouth 2 (two) times daily.    Historical Provider, MD  lisinopril-hydrochlorothiazide (PRINZIDE,ZESTORETIC) 20-25 MG per tablet Take 1 tablet by mouth daily.    Historical Provider, MD  oxyCODONE-acetaminophen (PERCOCET/ROXICET) 5-325 MG per tablet Take 1 tablet by mouth every 6 (six) hours as needed for moderate pain or severe pain. 10/05/13   Renae Fickle, MD  senna (SENOKOT) 8.6 MG TABS tablet Take 1 tablet (8.6 mg total) by mouth 2 (two) times daily. 10/05/13   Renae Fickle, MD  traMADol (ULTRAM) 50 MG tablet Take 50 mg by mouth 2 (two) times daily as needed for moderate pain.    Historical Provider, MD   BP 104/87  Pulse 88  Temp(Src) 97.7 F (36.5 C) (Oral)  Resp 18  SpO2 99% Physical Exam  Nursing note and vitals reviewed. Constitutional: She is oriented to person, place, and time. She appears well-developed and well-nourished. No distress.  HENT:  Head: Normocephalic and atraumatic.  Nose: Nose normal.  Eyes: Conjunctivae are normal.  Neck: Normal range of motion. Neck supple.  No tracheal deviation present.  Cardiovascular: Normal rate, regular rhythm and normal heart sounds.   No murmur heard. Pulmonary/Chest: Effort normal and breath sounds normal. No respiratory distress. She has no rales.  Abdominal: Soft. Bowel sounds are normal. She exhibits no distension and no mass. There is tenderness (at bruises from trochar sites, nontender elsewhere).  Musculoskeletal: Normal range of motion. She exhibits no edema.  Neurological: She is alert and oriented to person, place, and time.  Skin: Skin is warm and dry. No rash noted.  Psychiatric: She has a normal mood and  affect.    ED Course  Procedures (including critical care time) Labs Review Labs Reviewed - No data to display  Imaging Review No results found.   EKG Interpretation None      MDM   Final diagnoses:  Follow-up examination after abdominal surgery    Here for outpatient surgical follow up but did not schedule appointment.  Spoke with Dr. Derrell Lolling office who does not have appt scheduled but would like her to have follow up very soon.  They will call patient and schedule appointment as previously deemed necessary.  Pt appears well in NAD, abdomen is soft and only TTP at sites of trochar.  Otherwise asymptomatic.  Imaging nor labs not warranted.      Sofie Rower, MD 10/19/13 [phone]

## 2013-10-19 NOTE — ED Notes (Signed)
Pt is Nepali speaking; reports gallbladder removed last week. Today she is having pain to surgical site and wants a recheck. Denies n/v. No fever. Appears a x 4. Calm.

## 2013-10-22 ENCOUNTER — Inpatient Hospital Stay (HOSPITAL_COMMUNITY): Payer: Medicaid Other

## 2013-10-22 ENCOUNTER — Inpatient Hospital Stay (HOSPITAL_COMMUNITY)
Admission: EM | Admit: 2013-10-22 | Discharge: 2013-10-27 | DRG: 872 | Disposition: A | Discharge status: Home Health | Payer: Medicaid Other | Attending: Internal Medicine | Admitting: Internal Medicine

## 2013-10-22 ENCOUNTER — Emergency Department (HOSPITAL_COMMUNITY): Payer: Medicaid Other

## 2013-10-22 ENCOUNTER — Encounter (HOSPITAL_COMMUNITY): Payer: Self-pay | Admitting: Emergency Medicine

## 2013-10-22 DIAGNOSIS — E876 Hypokalemia: Secondary | ICD-10-CM | POA: Diagnosis present

## 2013-10-22 DIAGNOSIS — N390 Urinary tract infection, site not specified: Secondary | ICD-10-CM

## 2013-10-22 DIAGNOSIS — Z79899 Other long term (current) drug therapy: Secondary | ICD-10-CM | POA: Diagnosis not present

## 2013-10-22 DIAGNOSIS — N10 Acute tubulo-interstitial nephritis: Secondary | ICD-10-CM | POA: Diagnosis present

## 2013-10-22 DIAGNOSIS — N179 Acute kidney failure, unspecified: Secondary | ICD-10-CM | POA: Diagnosis present

## 2013-10-22 DIAGNOSIS — R7989 Other specified abnormal findings of blood chemistry: Secondary | ICD-10-CM | POA: Diagnosis present

## 2013-10-22 DIAGNOSIS — K810 Acute cholecystitis: Secondary | ICD-10-CM

## 2013-10-22 DIAGNOSIS — T46905A Adverse effect of unspecified agents primarily affecting the cardiovascular system, initial encounter: Secondary | ICD-10-CM | POA: Diagnosis present

## 2013-10-22 DIAGNOSIS — D509 Iron deficiency anemia, unspecified: Secondary | ICD-10-CM | POA: Diagnosis present

## 2013-10-22 DIAGNOSIS — M25561 Pain in right knee: Secondary | ICD-10-CM

## 2013-10-22 DIAGNOSIS — N12 Tubulo-interstitial nephritis, not specified as acute or chronic: Secondary | ICD-10-CM

## 2013-10-22 DIAGNOSIS — R509 Fever, unspecified: Secondary | ICD-10-CM | POA: Diagnosis present

## 2013-10-22 DIAGNOSIS — T502X5A Adverse effect of carbonic-anhydrase inhibitors, benzothiadiazides and other diuretics, initial encounter: Secondary | ICD-10-CM | POA: Diagnosis present

## 2013-10-22 DIAGNOSIS — K219 Gastro-esophageal reflux disease without esophagitis: Secondary | ICD-10-CM | POA: Diagnosis present

## 2013-10-22 DIAGNOSIS — A498 Other bacterial infections of unspecified site: Secondary | ICD-10-CM | POA: Diagnosis present

## 2013-10-22 DIAGNOSIS — K8050 Calculus of bile duct without cholangitis or cholecystitis without obstruction: Secondary | ICD-10-CM

## 2013-10-22 DIAGNOSIS — R7881 Bacteremia: Secondary | ICD-10-CM

## 2013-10-22 DIAGNOSIS — E860 Dehydration: Secondary | ICD-10-CM | POA: Diagnosis present

## 2013-10-22 DIAGNOSIS — R17 Unspecified jaundice: Secondary | ICD-10-CM | POA: Diagnosis present

## 2013-10-22 DIAGNOSIS — I10 Essential (primary) hypertension: Secondary | ICD-10-CM | POA: Diagnosis present

## 2013-10-22 DIAGNOSIS — R945 Abnormal results of liver function studies: Secondary | ICD-10-CM

## 2013-10-22 DIAGNOSIS — D61818 Other pancytopenia: Secondary | ICD-10-CM

## 2013-10-22 DIAGNOSIS — A419 Sepsis, unspecified organism: Secondary | ICD-10-CM | POA: Diagnosis present

## 2013-10-22 DIAGNOSIS — K8020 Calculus of gallbladder without cholecystitis without obstruction: Secondary | ICD-10-CM

## 2013-10-22 DIAGNOSIS — K851 Biliary acute pancreatitis without necrosis or infection: Secondary | ICD-10-CM

## 2013-10-22 LAB — URINALYSIS, ROUTINE W REFLEX MICROSCOPIC
Bilirubin Urine: NEGATIVE
GLUCOSE, UA: NEGATIVE mg/dL
Ketones, ur: NEGATIVE mg/dL
Nitrite: POSITIVE — AB
Protein, ur: 100 mg/dL — AB
SPECIFIC GRAVITY, URINE: 1.01 (ref 1.005–1.030)
UROBILINOGEN UA: 1 mg/dL (ref 0.0–1.0)
pH: 5.5 (ref 5.0–8.0)

## 2013-10-22 LAB — CBC WITH DIFFERENTIAL/PLATELET
BASOS PCT: 0 % (ref 0–1)
Basophils Absolute: 0 10*3/uL (ref 0.0–0.1)
Eosinophils Absolute: 0 10*3/uL (ref 0.0–0.7)
Eosinophils Relative: 0 % (ref 0–5)
HCT: 28.8 % — ABNORMAL LOW (ref 36.0–46.0)
Hemoglobin: 9.8 g/dL — ABNORMAL LOW (ref 12.0–15.0)
Lymphocytes Relative: 5 % — ABNORMAL LOW (ref 12–46)
Lymphs Abs: 0.7 10*3/uL (ref 0.7–4.0)
MCH: 25.2 pg — AB (ref 26.0–34.0)
MCHC: 34 g/dL (ref 30.0–36.0)
MCV: 74 fL — ABNORMAL LOW (ref 78.0–100.0)
Monocytes Absolute: 1.2 10*3/uL — ABNORMAL HIGH (ref 0.1–1.0)
Monocytes Relative: 9 % (ref 3–12)
NEUTROS PCT: 86 % — AB (ref 43–77)
Neutro Abs: 10.8 10*3/uL — ABNORMAL HIGH (ref 1.7–7.7)
PLATELETS: 179 10*3/uL (ref 150–400)
RBC: 3.89 MIL/uL (ref 3.87–5.11)
RDW: 15.4 % (ref 11.5–15.5)
WBC: 12.7 10*3/uL — ABNORMAL HIGH (ref 4.0–10.5)

## 2013-10-22 LAB — COMPREHENSIVE METABOLIC PANEL
ALBUMIN: 2.9 g/dL — AB (ref 3.5–5.2)
ALK PHOS: 121 U/L — AB (ref 39–117)
ALT: 39 U/L — AB (ref 0–35)
ANION GAP: 16 — AB (ref 5–15)
AST: 43 U/L — ABNORMAL HIGH (ref 0–37)
BUN: 20 mg/dL (ref 6–23)
CALCIUM: 8.3 mg/dL — AB (ref 8.4–10.5)
CO2: 20 mEq/L (ref 19–32)
Chloride: 96 mEq/L (ref 96–112)
Creatinine, Ser: 1.65 mg/dL — ABNORMAL HIGH (ref 0.50–1.10)
GFR calc Af Amer: 37 mL/min — ABNORMAL LOW (ref >90)
GFR calc non Af Amer: 32 mL/min — ABNORMAL LOW (ref >90)
Glucose, Bld: 143 mg/dL — ABNORMAL HIGH (ref 70–99)
POTASSIUM: 3.2 meq/L — AB (ref 3.7–5.3)
Sodium: 132 mEq/L — ABNORMAL LOW (ref 137–147)
TOTAL PROTEIN: 7.1 g/dL (ref 6.0–8.3)
Total Bilirubin: 1.7 mg/dL — ABNORMAL HIGH (ref 0.3–1.2)

## 2013-10-22 LAB — LIPASE, BLOOD: Lipase: 13 U/L (ref 11–59)

## 2013-10-22 LAB — URINE MICROSCOPIC-ADD ON

## 2013-10-22 LAB — I-STAT TROPONIN, ED: Troponin i, poc: 0 ng/mL (ref 0.00–0.08)

## 2013-10-22 LAB — LACTIC ACID, PLASMA: LACTIC ACID, VENOUS: 1.2 mmol/L (ref 0.5–2.2)

## 2013-10-22 MED ORDER — HEPARIN SODIUM (PORCINE) 5000 UNIT/ML IJ SOLN
5000.0000 [IU] | Freq: Three times a day (TID) | INTRAMUSCULAR | Status: DC
  Administered 2013-10-22 – 2013-10-27 (×15): 5000 [IU] via SUBCUTANEOUS
  Filled 2013-10-22 (×23): qty 1

## 2013-10-22 MED ORDER — IOHEXOL 300 MG/ML  SOLN: 25.0000 mL | INTRAMUSCULAR | Status: AC

## 2013-10-22 MED ORDER — ACETAMINOPHEN 650 MG RE SUPP: 650.0000 mg | Freq: Four times a day (QID) | RECTAL | Status: DC | PRN

## 2013-10-22 MED ORDER — ONDANSETRON HCL 4 MG/2ML IJ SOLN
4.0000 mg | Freq: Four times a day (QID) | INTRAMUSCULAR | Status: DC | PRN
  Administered 2013-10-23 – 2013-10-26 (×5): 4 mg via INTRAVENOUS
  Filled 2013-10-22 (×6): qty 2

## 2013-10-22 MED ORDER — POTASSIUM CHLORIDE CRYS ER 20 MEQ PO TBCR
40.0000 meq | EXTENDED_RELEASE_TABLET | Freq: Once | ORAL | Status: AC
  Administered 2013-10-22: 40 meq via ORAL
  Filled 2013-10-22: qty 2

## 2013-10-22 MED ORDER — ONDANSETRON HCL 4 MG PO TABS
4.0000 mg | ORAL_TABLET | Freq: Four times a day (QID) | ORAL | Status: DC | PRN
Start: 2013-10-22 — End: 2013-10-27

## 2013-10-22 MED ORDER — MORPHINE SULFATE 2 MG/ML IJ SOLN
2.0000 mg | INTRAMUSCULAR | Status: DC | PRN
  Administered 2013-10-22 – 2013-10-25 (×9): 2 mg via INTRAVENOUS
  Filled 2013-10-22 (×9): qty 1

## 2013-10-22 MED ORDER — SODIUM CHLORIDE 0.9 % IV SOLN
INTRAVENOUS | Status: DC
  Administered 2013-10-22 – 2013-10-25 (×6): via INTRAVENOUS

## 2013-10-22 MED ORDER — DEXTROSE 5 % IV SOLN
1.0000 g | Freq: Once | INTRAVENOUS | Status: AC
  Administered 2013-10-22: 1 g via INTRAVENOUS
  Filled 2013-10-22: qty 10

## 2013-10-22 MED ORDER — DEXTROSE 5 % IV SOLN
1.0000 g | INTRAVENOUS | Status: DC
  Administered 2013-10-23: 1 g via INTRAVENOUS
  Filled 2013-10-22: qty 10

## 2013-10-22 MED ORDER — ACETAMINOPHEN 325 MG PO TABS
650.0000 mg | ORAL_TABLET | Freq: Four times a day (QID) | ORAL | Status: DC | PRN
  Administered 2013-10-22 – 2013-10-24 (×4): 650 mg via ORAL
  Filled 2013-10-22 (×4): qty 2

## 2013-10-22 MED ORDER — SODIUM CHLORIDE 0.9 % IV BOLUS (SEPSIS)
1000.0000 mL | Freq: Once | INTRAVENOUS | Status: AC
  Administered 2013-10-22: 1000 mL via INTRAVENOUS

## 2013-10-22 MED ORDER — IOHEXOL 300 MG/ML  SOLN
25.0000 mL | Freq: Once | INTRAMUSCULAR | Status: AC | PRN
  Administered 2013-10-22: 25 mL via ORAL

## 2013-10-22 NOTE — ED Notes (Addendum)
Unable to access the med rooms Security working on badge readers. Meds not given due to this reason.

## 2013-10-22 NOTE — ED Notes (Signed)
6N notified that pt went to [place] and will be transported to the floor when returns to the ED.

## 2013-10-22 NOTE — H&P (Signed)
Pt seen and examined with Ms. York, PA-C. Agree with assessment and plan as outlined above. Pt presents with sepsis with UTI and pyelonephritis. Pan cultures are pending. Pt also noted to have ARF, now on IVF. Pt also with mildly elevated LFT's with elevated bilirubin. Non-contrast CT abd unremarkable. Agree with abd [place] for further evaluation.

## 2013-10-22 NOTE — ED Provider Notes (Signed)
CSN: [id number]     Arrival date & time 10/22/13  1914 History   First MD Initiated Contact with Patient 10/22/13 [phone]     Chief Complaint  Patient presents with  . Cough  . Fever  . Generalized Body Aches     (Consider location/radiation/quality/duration/timing/severity/associated sxs/prior Treatment) HPI Comments: Patient presents to the ED with a chief complaint of abdominal pain.  She states that the pain has been ongoing for the past 4 days, but worsened last night.  She was recently admitted for gallstone pancreatitis and had a subsequent cholecystectomy.  She reports associated fever, chills, nausea, cough, and dysuria. She did not measure a temperature, but states that she felt warm to touch. She states that her pain is moderate to severe.  She has taken 600 mg of ibuprofen with some relief.  The symptoms do not radiate.  History is extremely difficult to obtain even with Publishing copy.  The history is provided by the patient. The history is limited by a language barrier. A language interpreter was used.    Past Medical History  Diagnosis Date  . Hypertension   . Chronic knee pain   . GERD (gastroesophageal reflux disease)   . Gallstone pancreatitis   . Cholelithiasis    Past Surgical History  Procedure Laterality Date  . External ear surgery      right mastoidectomy  . Cholecystectomy N/A 10/04/2013    Procedure: LAPAROSCOPIC CHOLECYSTECTOMY WITH INTRAOPERATIVE CHOLANGIOGRAM;  Surgeon: Axel Filler, MD;  Location: [facility] OR;  Service: General;  Laterality: N/A;   History reviewed. No pertinent family history. History  Substance Use Topics  . Smoking status: Never Smoker   . Smokeless tobacco: Not on file  . Alcohol Use: No   OB History   Grav Para Term Preterm Abortions TAB SAB Ect Mult Living                 Review of Systems  Constitutional: Positive for fever. Negative for chills.  Respiratory: Positive for cough. Negative for shortness of breath.    Cardiovascular: Negative for chest pain.  Gastrointestinal: Positive for abdominal pain. Negative for nausea, vomiting, diarrhea and constipation.  Genitourinary: Negative for dysuria.  All other systems reviewed and are negative.     Allergies  Review of patient's allergies indicates no known allergies.  Home Medications   Prior to Admission medications   Medication Sig Start Date End Date Taking? Authorizing Provider  famotidine (PEPCID) 20 MG tablet Take 20 mg by mouth 2 (two) times daily.    Historical Provider, MD  furosemide (LASIX) 20 MG tablet Take 20 mg by mouth daily.    Historical Provider, MD  lisinopril-hydrochlorothiazide (PRINZIDE,ZESTORETIC) 20-25 MG per tablet Take 1 tablet by mouth daily.    Historical Provider, MD   BP 107/57  Pulse 101  Temp(Src) 98.9 F (37.2 C) (Oral)  SpO2 98% Physical Exam  Nursing note and vitals reviewed. Constitutional: She is oriented to person, place, and time. She appears well-developed and well-nourished.  HENT:  Head: Normocephalic and atraumatic.  Eyes: Conjunctivae and EOM are normal. Pupils are equal, round, and reactive to light.  Neck: Normal range of motion. Neck supple.  Cardiovascular: Normal rate and regular rhythm.  Exam reveals no gallop and no friction rub.   No murmur heard. Pulmonary/Chest: Effort normal and breath sounds normal. No respiratory distress. She has no wheezes. She has no rales. She exhibits no tenderness.  Abdominal: Soft. Bowel sounds are normal. She exhibits no distension and  no mass. There is tenderness. There is no rebound and no guarding.  Diffuse abdominal tenderness, more in the right upper quadrant, bruising on RUQ around surgical sites  Musculoskeletal: Normal range of motion. She exhibits no edema and no tenderness.  Neurological: She is alert and oriented to person, place, and time.  Skin: Skin is warm and dry.  Psychiatric: She has a normal mood and affect. Her behavior is normal.  Judgment and thought content normal.    ED Course  Procedures (including critical care time) Results for orders placed during the hospital encounter of 10/22/13  CBC WITH DIFFERENTIAL      Result Value Ref Range   WBC 12.7 (*) 4.0 - 10.5 K/uL   RBC 3.89  3.87 - 5.11 MIL/uL   Hemoglobin 9.8 (*) 12.0 - 15.0 g/dL   HCT 16.1 (*) 09.6 - 04.5 %   MCV 74.0 (*) 78.0 - 100.0 fL   MCH 25.2 (*) 26.0 - 34.0 pg   MCHC 34.0  30.0 - 36.0 g/dL   RDW 40.9  81.1 - 91.4 %   Platelets 179  150 - 400 K/uL   Neutrophils Relative % 86 (*) 43 - 77 %   Neutro Abs 10.8 (*) 1.7 - 7.7 K/uL   Lymphocytes Relative 5 (*) 12 - 46 %   Lymphs Abs 0.7  0.7 - 4.0 K/uL   Monocytes Relative 9  3 - 12 %   Monocytes Absolute 1.2 (*) 0.1 - 1.0 K/uL   Eosinophils Relative 0  0 - 5 %   Eosinophils Absolute 0.0  0.0 - 0.7 K/uL   Basophils Relative 0  0 - 1 %   Basophils Absolute 0.0  0.0 - 0.1 K/uL  COMPREHENSIVE METABOLIC PANEL      Result Value Ref Range   Sodium 132 (*) 137 - 147 mEq/L   Potassium 3.2 (*) 3.7 - 5.3 mEq/L   Chloride 96  96 - 112 mEq/L   CO2 20  19 - 32 mEq/L   Glucose, Bld 143 (*) 70 - 99 mg/dL   BUN 20  6 - 23 mg/dL   Creatinine, Ser 7.82 (*) 0.50 - 1.10 mg/dL   Calcium 8.3 (*) 8.4 - 10.5 mg/dL   Total Protein 7.1  6.0 - 8.3 g/dL   Albumin 2.9 (*) 3.5 - 5.2 g/dL   AST 43 (*) 0 - 37 U/L   ALT 39 (*) 0 - 35 U/L   Alkaline Phosphatase 121 (*) 39 - 117 U/L   Total Bilirubin 1.7 (*) 0.3 - 1.2 mg/dL   GFR calc non Af Amer 32 (*) >90 mL/min   GFR calc Af Amer 37 (*) >90 mL/min   Anion gap 16 (*) 5 - 15  LIPASE, BLOOD      Result Value Ref Range   Lipase 13  11 - 59 U/L  URINALYSIS, ROUTINE W REFLEX MICROSCOPIC      Result Value Ref Range   Color, Urine YELLOW  YELLOW   APPearance TURBID (*) CLEAR   Specific Gravity, Urine 1.010  1.005 - 1.030   pH 5.5  5.0 - 8.0   Glucose, UA NEGATIVE  NEGATIVE mg/dL   Hgb urine dipstick LARGE (*) NEGATIVE   Bilirubin Urine NEGATIVE  NEGATIVE   Ketones, ur  NEGATIVE  NEGATIVE mg/dL   Protein, ur 956 (*) NEGATIVE mg/dL   Urobilinogen, UA 1.0  0.0 - 1.0 mg/dL   Nitrite POSITIVE (*) NEGATIVE   Leukocytes, UA LARGE (*) NEGATIVE  URINE MICROSCOPIC-ADD ON      Result Value Ref Range   WBC, UA TOO NUMEROUS TO COUNT  <3 WBC/hpf   RBC / HPF 7-10  <3 RBC/hpf   Bacteria, UA MANY (*) RARE  I-STAT TROPOININ, ED      Result Value Ref Range   Troponin i, poc 0.00  0.00 - 0.08 ng/mL   Comment 3            Ct Abdomen Pelvis Wo Contrast  10/22/2013   CLINICAL DATA:  Generalized abdominal pain. Fever. Nausea. Dysuria. Cholecystectomy on 10/04/2013.  EXAM: CT ABDOMEN AND PELVIS WITHOUT CONTRAST  TECHNIQUE: Multidetector CT imaging of the abdomen and pelvis was performed following the standard protocol without IV contrast. Oral contrast was administered.  COMPARISON:  Enhanced CT abdomen and pelvis 10/02/2013.  FINDINGS: Interval cholecystectomy since the prior examination. No evidence of fluid collection in the cholecystectomy bed. Stable mild intra and extrahepatic biliary ductal dilation without obstructing stone or mass. Normal unenhanced appearance of the liver, spleen, pancreas and adrenal glands. Hyperdense cyst involving the lower pole of the left kidney approximating 1.2 cm. Edema involving the right kidney with associated perinephric edema. No evidence of urinary tract calculi on either side. Mild aortoiliac atherosclerosis without aneurysm. No significant lymphadenopathy.  Stomach decompressed and normal in appearance. Normal appearing small bowel. Entire colon relatively decompressed with expected stool burden. Upper normal caliber appendix measuring approximately 7 mm diameter without evidence of periappendiceal inflammation. No ascites.  Bone window images demonstrate generalized osseous demineralization, compression fracture of the upper endplate of L3 on the order of 15-20% or so (unchanged since lumbar spine x-rays 08/27/2013), degenerative disc disease and  spondylosis at L5-S1, and facet degenerative changes involving the lower lumbar spine. Visualized lung bases clear apart from the expected dependent atelectasis posteriorly. Heart mildly enlarged.  IMPRESSION: 1. Pyelonephritis involving the right kidney. 2. No acute abnormalities otherwise involving the abdomen or pelvis. 3. Hyperdense proteinaceous or hemorrhagic cyst involving the lower pole of the left kidney. 4. Osseous findings as above.   Electronically Signed   By: Hulan Saas M.D.   On: 10/22/2013 13:08   Dg Chest 2 View  10/22/2013   CLINICAL DATA:  Productive cough with chest pain  EXAM: CHEST  2 VIEW  COMPARISON:  Portable chest x-ray of October 05, 2013 and September 22, 2012  FINDINGS: The lungs are better inflated today. The interstitial markings remain increased bilaterally. The cardiopericardial silhouette is top-normal in size. The pulmonary vascularity is indistinct. There is no pleural effusion. The bony thorax is unremarkable.  IMPRESSION: There are persistently increased interstitial marking markings not greatly changed from the prior study allowing for improved in aeration of the lungs today. This may reflect acute and chronic bronchitis or early interstitial pneumonia. CHF is felt less likely.   Electronically Signed   By: David  [place]   On: 10/22/2013 09:41   Dg Cholangiogram Operative  10/04/2013   CLINICAL DATA:  Cholelithiasis  EXAM: INTRAOPERATIVE CHOLANGIOGRAM  TECHNIQUE: Cholangiographic images from the C-arm fluoroscopic device were submitted for interpretation post-operatively. Please see the procedural report for the amount of contrast and the fluoroscopy time utilized.  COMPARISON:  None.  FINDINGS: Contrast fills the biliary tree without evidence of common bile duct filling defects.  IMPRESSION: Patent biliary tree without evidence of common bile duct stones.   Electronically Signed   By: Maryclare Bean M.D.   On: 10/04/2013 09:29   Ct Abdomen Pelvis W Contrast  10/02/2013  CLINICAL DATA:  Right lower quadrant abdominal pain. Elevated lipase.  EXAM: CT ABDOMEN AND PELVIS WITH CONTRAST  TECHNIQUE: Multidetector CT imaging of the abdomen and pelvis was performed using the standard protocol following bolus administration of intravenous contrast.  CONTRAST:  25mL OMNIPAQUE IOHEXOL 300 MG/ML SOLN, OMNIPAQUE IOHEXOL 300 MG/ML SOLN  COMPARISON:  Abdominal ultrasound performed 08/31/2013  FINDINGS: Minimal bibasilar atelectasis is noted.  There is mild gallbladder wall thickening and question of trace pericholecystic fluid. The common hepatic duct is dilated to 1.0 cm in maximum diameter, and there is mild prominence of the intrahepatic biliary ducts. This raises concern for distal obstruction, though no definite mass or stone is characterized on this study.  The liver is otherwise unremarkable in appearance. The spleen is within normal limits. The pancreas is otherwise grossly unremarkable. The adrenal glands are normal in appearance.  The kidneys are unremarkable in appearance. There is no evidence of hydronephrosis. No renal or ureteral stones are seen. No perinephric stranding is appreciated.  No free fluid is identified. The small bowel is unremarkable in appearance. The stomach is within normal limits. No acute vascular abnormalities are seen.  The appendix is normal in caliber, without evidence for appendicitis. The colon is unremarkable in appearance.  The bladder is moderately distended and grossly unremarkable. The uterus is grossly unremarkable. The ovaries are relatively symmetric. No suspicious adnexal masses are seen. No inguinal lymphadenopathy is seen.  No acute osseous abnormalities are identified. There is mild chronic loss of height at L3.  IMPRESSION: 1. Dilatation of the common hepatic duct to 1.0 cm, with mild prominence of the intrahepatic biliary ducts. This raises concern for distal obstruction, though no definite mass or stone is characterized on this study.  MRCP or ERCP would be helpful for further evaluation, when and as deemed clinically appropriate. 2. Mild gallbladder wall thickening and question of trace pericholecystic fluid. This could reflect the obstruction described above, though mild cholecystitis cannot be entirely excluded.   Electronically Signed   By: Roanna Raider M.D.   On: 10/02/2013 02:26   Dg Chest Port 1 View  10/05/2013   CLINICAL DATA:  Infiltrate  EXAM: PORTABLE CHEST - 1 VIEW  COMPARISON:  October 04, 2013  FINDINGS: There is patchy consolidation in the left base. Lungs elsewhere clear. Heart is mildly enlarged with pulmonary vascularity within normal limits. No adenopathy. There is atherosclerotic change in the aorta.  IMPRESSION: Slight increase in patchy left base atelectasis. Right lung clear. Note that the degree of inspiration is shallow.   Electronically Signed   By: Bretta Bang M.D.   On: 10/05/2013 07:29   Dg Chest Port 1 View  10/04/2013   CLINICAL DATA:  New onset fever.  Preop for surgery.  Chest pain.  EXAM: PORTABLE CHEST - 1 VIEW  COMPARISON:  08/31/2013.  FINDINGS: Cardiac silhouette is normal in size. No mediastinal or hilar masses.  Mild interstitial prominence, stable. There is a small focal opacity that projects at the left lateral lung base. This may reflect a focus of atelectasis. Small area of pneumonia is not excluded but felt unlikely. No other lung opacities. No pleural effusion or pneumothorax.  Bony thorax is demineralized but grossly intact.  IMPRESSION: Small area of opacity at the left lateral lung base. This is likely atelectasis. A small area of infiltrate is not excluded. No other evidence of acute cardiopulmonary disease.   Electronically Signed   By: Amie Portland M.D.   On: 10/04/2013 07:53  EKG Interpretation None      MDM   Final diagnoses:  Pyelonephritis  AKI (acute kidney injury)    Patient with recent lap chole from gallstone pancreatitis.  Complains of abdominal pain x  4 days.  Will check labs, CT and will reassess.  Labs remarkable for AKI, creatinine has doubled in the past two weeks from a baseline of 0.8.  Will get non-con CT with oral contrast.  UA or is remarkable for UTI. Will treat with Rocephin, will order urine culture. Suspect for this reason for the patient's AKI.  Patient seen by and discussed with Dr. Wilkie Aye.  Will admit to medicine.    Roxy Horseman, PA-C 10/22/13 1527

## 2013-10-22 NOTE — ED Provider Notes (Signed)
Medical screening examination/treatment/procedure(s) were conducted as a shared visit with non-physician practitioner(s) and myself.  I personally evaluated the patient during the encounter.   EKG Interpretation   Date/Time:  Thursday October 22 2013 11:11:54 EDT Ventricular Rate:  90 PR Interval:  152 QRS Duration: 79 QT Interval:  348 QTC Calculation: 426 R Axis:   2 Text Interpretation:  Sinus rhythm Low voltage, precordial leads Abnormal  R-wave progression, early transition LVH by voltage Confirmed by HORTON   MD, COURTNEY ([id number]) on 10/22/2013 4:22:44 PM      See additional note.  Shon Baton, MD 10/22/13 [phone]

## 2013-10-22 NOTE — ED Notes (Signed)
PA at bedside.

## 2013-10-22 NOTE — ED Notes (Signed)
Translator phone requested.

## 2013-10-22 NOTE — ED Notes (Signed)
Pt drinking contrast. 

## 2013-10-22 NOTE — ED Notes (Signed)
Admitting at the bedside.  

## 2013-10-22 NOTE — H&P (Signed)
[facility] History and Physical                                                                                    Patient Demographics  Michelle Boyd, is a 63 y.o. female  MRN: [id number]   DOB - 1950-09-11  Admit Date - 10/22/2013  Outpatient Primary MD for the patient is Holland Commons, NP   With History of -  Past Medical History  Diagnosis Date  . Hypertension   . Chronic knee pain   . GERD (gastroesophageal reflux disease)   . Gallstone pancreatitis   . Cholelithiasis       Past Surgical History  Procedure Laterality Date  . External ear surgery      right mastoidectomy  . Cholecystectomy N/A 10/04/2013    Procedure: LAPAROSCOPIC CHOLECYSTECTOMY WITH INTRAOPERATIVE CHOLANGIOGRAM;  Surgeon: Axel Filler, MD;  Location: [facility] OR;  Service: General;  Laterality: N/A;    in for   Chief Complaint  Patient presents with  . Cough  . Fever  . Generalized Body Aches     HPI  Michelle Boyd  is a 63 y.o. female, (who speaks [place] - telephone interpreter used.) who was hospitalized from 8/14 - 8/17 with gallstone pancreatitis.  She underwent cholecystectomy on 8/16.  She reports that for the last 4 days her stomach has felt "hot", she has had fever, chills, and vomiting.  She lives at home with her family but reports that they all go to work during the day and she is at home alone.  Normally she completes all of her ADLs but over the last few days has felt unable to do so.  Review of Systems    In addition to the HPI above,  No Headache, No changes with Vision or hearing, No problems swallowing food or Liquids, No Chest pain, Cough or Shortness of Breath, No Blood in stool or Urine, - but urine has appeared darker than usual No new skin rashes or bruises, No new joints pains-aches,  No new weakness, tingling, numbness in any extremity, No recent weight gain or loss, No polyuria, polydypsia or polyphagia, No significant Mental Stressors.  A full 10 point Review of  Systems was done, except as stated above, all other Review of Systems were negative.   Social History History  Substance Use Topics  . Smoking status: Never Smoker   . Smokeless tobacco: Not on file  . Alcohol Use: No     Family History History reviewed. No pertinent family history.   Prior to Admission medications   Medication Sig Start Date End Date Taking? Authorizing Provider  acetaminophen (TYLENOL) 325 MG tablet Take 650 mg by mouth every 6 (six) hours as needed for mild pain or fever.   Yes Historical Provider, MD  famotidine (PEPCID) 20 MG tablet Take 20 mg by mouth 2 (two) times daily.   Yes Historical Provider, MD  furosemide (LASIX) 20 MG tablet Take 20 mg by mouth daily.   Yes Historical Provider, MD  ibuprofen (ADVIL,MOTRIN) 200 MG tablet Take 200-800 mg by mouth every 6 (six) hours as needed for fever or mild pain.   Yes Historical Provider, MD  lisinopril-hydrochlorothiazide (PRINZIDE,ZESTORETIC) 20-25 MG per tablet Take 1 tablet by mouth daily.   Yes Historical Provider, MD    No Known Allergies  Physical Exam  Vitals  Blood pressure 113/53, pulse 91, temperature 98.9 F (37.2 C), temperature source Oral, resp. rate 24, SpO2 100.00%.   General:  lying in bed in NAD, Alert and orientated, clear speech  Psych:  Normal affect and insight, Not Suicidal or Homicidal, Awake Alert, Oriented X 3.  Neuro:   No F.N deficits, ALL C.Nerves Intact, Strength 5/5 all 4 extremities, Sensation intact all 4 extremities.  ENT:  Ears and Eyes appear Normal, Conjunctivae clear, PERRLA. Moist Oral Mucosa.  Neck:  Supple Neck, No JVD, No cervical lymphadenopathy appreciated  Respiratory:  Symmetrical Chest wall movement, Good air movement bilaterally, CTAB.  Cardiac:  RRR, No Gallops, Rubs or Murmurs, No Parasternal Heave.  Abdomen:  Positive Bowel Sounds, Abdomen Soft, diffusely tender, No organomegaly appreciated. No CVA tenderness.  Skin:  No Cyanosis, Normal Skin  Turgor, No Skin Rash or Bruise.  Extremities:  Good muscle tone,  joints appear normal , no effusions, Normal ROM.   Data Review  CBC  Recent Labs Lab 10/22/13 0922  WBC 12.7*  HGB 9.8*  HCT 28.8*  PLT 179  MCV 74.0*  MCH 25.2*  MCHC 34.0  RDW 15.4  LYMPHSABS 0.7  MONOABS 1.2*  EOSABS 0.0  BASOSABS 0.0   ------------------------------------------------------------------------------------------------------------------  Chemistries   Recent Labs Lab 10/22/13 0922  NA 132*  K 3.2*  CL 96  CO2 20  GLUCOSE 143*  BUN 20  CREATININE 1.65*  CALCIUM 8.3*  AST 43*  ALT 39*  ALKPHOS 121*  BILITOT 1.7*    Urinalysis    Component Value Date/Time   COLORURINE YELLOW 10/22/2013 1100   APPEARANCEUR TURBID* 10/22/2013 1100   LABSPEC 1.010 10/22/2013 1100   PHURINE 5.5 10/22/2013 1100   GLUCOSEU NEGATIVE 10/22/2013 1100   HGBUR LARGE* 10/22/2013 1100   BILIRUBINUR NEGATIVE 10/22/2013 1100   KETONESUR NEGATIVE 10/22/2013 1100   PROTEINUR 100* 10/22/2013 1100   UROBILINOGEN 1.0 10/22/2013 1100   NITRITE POSITIVE* 10/22/2013 1100   LEUKOCYTESUR LARGE* 10/22/2013 1100    ----------------------------------------------------------------------------------------------------------------  Imaging results:   Ct Abdomen Pelvis Wo Contrast  10/22/2013   CLINICAL DATA:  Generalized abdominal pain. Fever. Nausea. Dysuria. Cholecystectomy on 10/04/2013.  EXAM: CT ABDOMEN AND PELVIS WITHOUT CONTRAST  TECHNIQUE: Multidetector CT imaging of the abdomen and pelvis was performed following the standard protocol without IV contrast. Oral contrast was administered.  COMPARISON:  Enhanced CT abdomen and pelvis 10/02/2013.  FINDINGS: Interval cholecystectomy since the prior examination. No evidence of fluid collection in the cholecystectomy bed. Stable mild intra and extrahepatic biliary ductal dilation without obstructing stone or mass. Normal unenhanced appearance of the liver, spleen, pancreas and adrenal glands.  Hyperdense cyst involving the lower pole of the left kidney approximating 1.2 cm. Edema involving the right kidney with associated perinephric edema. No evidence of urinary tract calculi on either side. Mild aortoiliac atherosclerosis without aneurysm. No significant lymphadenopathy.  Stomach decompressed and normal in appearance. Normal appearing small bowel. Entire colon relatively decompressed with expected stool burden. Upper normal caliber appendix measuring approximately 7 mm diameter without evidence of periappendiceal inflammation. No ascites.  Bone window images demonstrate generalized osseous demineralization, compression fracture of the upper endplate of L3 on the order of 15-20% or so (unchanged since lumbar spine x-rays 08/27/2013), degenerative disc disease and spondylosis at L5-S1, and facet degenerative changes involving the lower lumbar  spine. Visualized lung bases clear apart from the expected dependent atelectasis posteriorly. Heart mildly enlarged.  IMPRESSION: 1. Pyelonephritis involving the right kidney. 2. No acute abnormalities otherwise involving the abdomen or pelvis. 3. Hyperdense proteinaceous or hemorrhagic cyst involving the lower pole of the left kidney. 4. Osseous findings as above.   Electronically Signed   By: Hulan Saas M.D.   On: 10/22/2013 13:08   Dg Chest 2 View  10/22/2013   CLINICAL DATA:  Productive cough with chest pain  EXAM: CHEST  2 VIEW  COMPARISON:  Portable chest x-ray of October 05, 2013 and September 22, 2012  FINDINGS: The lungs are better inflated today. The interstitial markings remain increased bilaterally. The cardiopericardial silhouette is top-normal in size. The pulmonary vascularity is indistinct. There is no pleural effusion. The bony thorax is unremarkable.  IMPRESSION: There are persistently increased interstitial marking markings not greatly changed from the prior study allowing for improved in aeration of the lungs today. This may reflect acute and  chronic bronchitis or early interstitial pneumonia. CHF is felt less likely.   Electronically Signed   By: David  [place]   On: 10/22/2013 09:41   Dg Cholangiogram Operative  10/04/2013   CLINICAL DATA:  Cholelithiasis  EXAM: INTRAOPERATIVE CHOLANGIOGRAM  TECHNIQUE: Cholangiographic images from the C-arm fluoroscopic device were submitted for interpretation post-operatively. Please see the procedural report for the amount of contrast and the fluoroscopy time utilized.  COMPARISON:  None.  FINDINGS: Contrast fills the biliary tree without evidence of common bile duct filling defects.  IMPRESSION: Patent biliary tree without evidence of common bile duct stones.   Electronically Signed   By: Maryclare Bean M.D.   On: 10/04/2013 09:29    My personal review of EKG: Rhythm NSR  - minimal EKG changes (1 box ST elevation in 2 leads)    Assessment & Plan  Principal Problem:   Sepsis secondary to UTI Active Problems:   Hypertension   Hypokalemia   AKI (acute kidney injury)   Acute renal failure   Pyelonephritis, acute   Iron (Fe) deficiency anemia   Elevated LFTs   Sepsis Secondary to pyelonephritis and UTI Give gentle IVF. Blood and urine cultures obtained. Started on Rocephin.  Pyelonephritis Of the right kidney as evidenced on CT  Give gentle IVF. Blood and urine cultures obtained. Started on Rocephin.  UTI Please see above.  Acute Renal Failure Secondary to UTI and dehydration compounded by BP medications. Baseline creatinine 0.85 on 10/05/2013 Will hold Lisinopril and HCTZ due to renal failure.  Hypokalemia Mild. Will replete and check bmet in am.  HTN Currently controlled. Will hold Lisinopril and HCTZ due to renal failure.  Iron Deficiency (microcytic) Anemia Hgb was 11.9 just 3 weeks ago. Will check an anemia panel in the am. Guiac stool.  Elevated LFTs. Recent lap chole and pancreatitis However patient's LFTs are all trending up.  Bili in particular is 1.7. Non  contrast CT abdomen pelvis is negative for CBD stone, but will check abd ultrasound.   DVT Prophylaxis Heparin   AM Labs Ordered, also please review Full Orders  Family Communication:   Non at bedside.   Code Status:  full  Likely DC to  Home   Condition:  Guarded.  Time spent in minutes : 60    York, Tora Kindred PA-C on 10/22/2013 at 2:45 PM  Between 7am to 7pm - Pager - [phone]  After 7pm go to [email or web address] - password [email or web address]  And look for the night  coverage person covering me after hours  [facility] Office  [phone]

## 2013-10-22 NOTE — ED Notes (Signed)
63 yo female presents with multiple complaint. Generalized body aches, fever, nausea and dysuria. Pts family at bedside reports the symptoms began yesterday around 5pm. Pt had fever but took tylenol. Denies Chest pain or SOB. Pt took 600 mg Ibuprofen this a.m. Pt alert oriented with no distress.

## 2013-10-22 NOTE — ED Provider Notes (Signed)
Medical screening examination/treatment/procedure(s) were conducted as a shared visit with non-physician practitioner(s) and myself.  I personally evaluated the patient during the encounter.   EKG Interpretation None      This is a 63 year old female with recent history of gallstone pancreatitis status post cholecystectomy on August 16 he presents with 3-4 days of generalized bodyaches, fevers, and abdominal pain. History is limited secondary to language barrier. Patient reports chills without documented fevers. She reports diffuse abdominal pain. On exam, patient is nontoxic. Afebrile. Laparoscopic abdominal incisions are clean, dry, and intact. Mild bruising noted over the right upper quadrant. Diffuse tenderness to palpation without signs of peritonitis.  W/u notable for nitrite pos urine and evidence of pyelonephritis.  Will obtain cultures and given IV rocephin.  Given physical exam and ct findings, feel patient would benefit from admission and IV abx.  Shon Baton, MD 10/22/13 2006

## 2013-10-23 ENCOUNTER — Encounter (HOSPITAL_COMMUNITY): Payer: Self-pay | Admitting: General Practice

## 2013-10-23 DIAGNOSIS — R7881 Bacteremia: Secondary | ICD-10-CM

## 2013-10-23 DIAGNOSIS — N179 Acute kidney failure, unspecified: Secondary | ICD-10-CM

## 2013-10-23 LAB — RETICULOCYTES
RBC.: 3.87 MIL/uL (ref 3.87–5.11)
RETIC CT PCT: 0.9 % (ref 0.4–3.1)
Retic Count, Absolute: 34.8 10*3/uL (ref 19.0–186.0)

## 2013-10-23 LAB — CBC
HCT: 28.9 % — ABNORMAL LOW (ref 36.0–46.0)
HEMOGLOBIN: 9.8 g/dL — AB (ref 12.0–15.0)
MCH: 25.3 pg — AB (ref 26.0–34.0)
MCHC: 33.9 g/dL (ref 30.0–36.0)
MCV: 74.7 fL — AB (ref 78.0–100.0)
Platelets: 162 10*3/uL (ref 150–400)
RBC: 3.87 MIL/uL (ref 3.87–5.11)
RDW: 16 % — ABNORMAL HIGH (ref 11.5–15.5)
WBC: 10.9 10*3/uL — AB (ref 4.0–10.5)

## 2013-10-23 LAB — VITAMIN B12: Vitamin B-12: 394 pg/mL (ref 211–911)

## 2013-10-23 LAB — COMPREHENSIVE METABOLIC PANEL
ALT: 37 U/L — AB (ref 0–35)
AST: 38 U/L — AB (ref 0–37)
Albumin: 2.6 g/dL — ABNORMAL LOW (ref 3.5–5.2)
Alkaline Phosphatase: 124 U/L — ABNORMAL HIGH (ref 39–117)
Anion gap: 14 (ref 5–15)
BUN: 20 mg/dL (ref 6–23)
CALCIUM: 8.4 mg/dL (ref 8.4–10.5)
CO2: 19 mEq/L (ref 19–32)
CREATININE: 1.56 mg/dL — AB (ref 0.50–1.10)
Chloride: 99 mEq/L (ref 96–112)
GFR calc Af Amer: 39 mL/min — ABNORMAL LOW (ref >90)
GFR calc non Af Amer: 34 mL/min — ABNORMAL LOW (ref >90)
Glucose, Bld: 90 mg/dL (ref 70–99)
Potassium: 3.8 mEq/L (ref 3.7–5.3)
SODIUM: 132 meq/L — AB (ref 137–147)
TOTAL PROTEIN: 6.9 g/dL (ref 6.0–8.3)
Total Bilirubin: 1.2 mg/dL (ref 0.3–1.2)

## 2013-10-23 LAB — FERRITIN: Ferritin: 373 ng/mL — ABNORMAL HIGH (ref 10–291)

## 2013-10-23 LAB — IRON AND TIBC: UIBC: 197 ug/dL (ref 125–400)

## 2013-10-23 LAB — FOLATE: Folate: 13.7 ng/mL

## 2013-10-23 MED ORDER — PIPERACILLIN-TAZOBACTAM 3.375 G IVPB
3.3750 g | Freq: Three times a day (TID) | INTRAVENOUS | Status: DC
  Administered 2013-10-23 – 2013-10-25 (×5): 3.375 g via INTRAVENOUS
  Filled 2013-10-23 (×7): qty 50

## 2013-10-23 MED ORDER — SODIUM CHLORIDE 0.9 % IV BOLUS (SEPSIS): 250.0000 mL | INTRAVENOUS | Status: DC | PRN

## 2013-10-23 NOTE — Progress Notes (Signed)
CRITICAL VALUE ALERT  Critical value received:  2nd Round: Aerobic Portion Blood Cultures + for Gram Negative Rods  Date of notification:  10/23/2013  Time of notification:  1515  Critical value read back:Yes.    Nurse who received alert:  Gregery Na, RN  MD notified (1st page):  Dr. York Spaniel  Time of first page:  1530  MD notified (2nd page):  Time of second page:  Responding MD:  Dr. York Spaniel   Time MD responded:  [phone]

## 2013-10-23 NOTE — Progress Notes (Signed)
ANTIBIOTIC CONSULT NOTE - INITIAL  Pharmacy Consult for Zosyn Indication: Sepsis   No Known Allergies  Patient Measurements: Height:  (149.9 cm) Weight: 132 lb 7.9 oz (60.1 kg) IBW/kg (Calculated) : 43.2 Adjusted Body Weight: n/a   Vital Signs: Temp: 100.2 F (37.9 C) (09/04 1315) Temp src: Oral (09/04 1315) BP: 113/52 mmHg (09/04 1315) Pulse Rate: 103 (09/04 1315) Intake/Output from previous day: 09/03 0701 - 09/04 0700 In: -  Out: 175 [Urine:175] Intake/Output from this shift: Total I/O In: 1780 [I.V.:1730; IV Piggyback:50] Out: 425 [Urine:425]  Labs:  Recent Labs  10/22/13 0922 10/23/13 0345  WBC 12.7* 10.9*  HGB 9.8* 9.8*  PLT 179 162  CREATININE 1.65* 1.56*   Estimated Creatinine Clearance: 28.4 ml/min (by C-G formula based on Cr of 1.56). No results found for this basename: VANCOTROUGH, Leodis Binet, VANCORANDOM, GENTTROUGH, GENTPEAK, GENTRANDOM, TOBRATROUGH, TOBRAPEAK, TOBRARND, AMIKACINPEAK, AMIKACINTROU, AMIKACIN,  in the last 72 hours   Microbiology: Recent Results (from the past 720 hour(s))  SURGICAL PCR SCREEN     Status: None   Collection Time    10/03/13  5:01 AM      Result Value Ref Range Status   MRSA, PCR NEGATIVE  NEGATIVE Final   Staphylococcus aureus NEGATIVE  NEGATIVE Final   Comment:            The Xpert SA Assay (FDA     approved for NASAL specimens     in patients over 20 years of age),     is one component of     a comprehensive surveillance     program.  Test performance has     been validated by [employer] for patients greater     than or equal to 64 year old.     It is not intended     to diagnose infection nor to     guide or monitor treatment.  CULTURE, BLOOD (ROUTINE X 2)     Status: None   Collection Time    10/03/13 11:07 PM      Result Value Ref Range Status   Specimen Description BLOOD LEFT ARM   Final   Special Requests BOTTLES DRAWN AEROBIC AND ANAEROBIC 10CC EACH   Final   Culture  Setup Time      Final   Value: 10/04/2013 12:23     Performed at [facility]   Culture     Final   Value: NO GROWTH 5 DAYS     Performed at [facility]   Report Status 10/10/2013 FINAL   Final  CULTURE, BLOOD (ROUTINE X 2)     Status: None   Collection Time    10/03/13 11:13 PM      Result Value Ref Range Status   Specimen Description BLOOD LEFT HAND   Final   Special Requests BOTTLES DRAWN AEROBIC AND ANAEROBIC 10CC    Final   Culture  Setup Time     Final   Value: 10/04/2013 12:23     Performed at [facility]   Culture     Final   Value: NO GROWTH 5 DAYS     Performed at [facility]   Report Status 10/10/2013 FINAL   Final  URINE CULTURE     Status: None   Collection Time    10/04/13 12:00 PM      Result Value Ref Range Status   Specimen Description URINE, RANDOM   Final  Special Requests NONE   Final   Culture  Setup Time     Final   Value: 10/05/2013 02:08     Performed at [facility] Count     Final   Value: NO GROWTH     Performed at [facility]   Culture     Final   Value: NO GROWTH     Performed at [facility]   Report Status 10/06/2013 FINAL   Final  URINE CULTURE     Status: None   Collection Time    10/22/13 11:46 AM      Result Value Ref Range Status   Specimen Description URINE, CLEAN CATCH   Final   Special Requests NONE   Final   Culture  Setup Time     Final   Value: 10/22/2013 17:22     Performed at [facility] Count     Final   Value: >=100,000 COLONIES/ML     Performed at [facility]   Culture     Final   Value: ESCHERICHIA COLI     Performed at [facility]   Report Status PENDING   Incomplete  CULTURE, BLOOD (ROUTINE X 2)     Status: None   Collection Time    10/22/13  2:15 PM      Result Value Ref Range Status   Specimen Description BLOOD HAND LEFT   Final   Special Requests BOTTLES DRAWN AEROBIC AND ANAEROBIC 10CC   Final   Culture  Setup Time      Final   Value: 10/22/2013 19:56     Performed at [facility]   Culture     Final   Value: GRAM NEGATIVE RODS     Note: Gram Stain Report Called to,Read Back By and Verified With: Gregery Na @ 1456 ON [phone] BY [facility]     Performed at [facility]   Report Status PENDING   Incomplete  CULTURE, BLOOD (ROUTINE X 2)     Status: None   Collection Time    10/22/13  2:30 PM      Result Value Ref Range Status   Specimen Description BLOOD ARM RIGHT   Final   Special Requests BOTTLES DRAWN AEROBIC AND ANAEROBIC 10CC   Final   Culture  Setup Time     Final   Value: 10/22/2013 19:53     Performed at [facility]   Culture     Final   Value: GRAM NEGATIVE RODS     Note: Culture results may be compromised due to an excessive volume of blood received in culture bottles. Gram Stain Report Called to,Read Back By and Verified With: Leodis Binet @ [phone] ON [phone] BY [facility]     Performed at [facility]   Report Status PENDING   Incomplete    Medical History: Past Medical History  Diagnosis Date  . Hypertension   . Chronic knee pain   . GERD (gastroesophageal reflux disease)   . Gallstone pancreatitis   . Cholelithiasis     Medications:  Prescriptions prior to admission  Medication Sig Dispense Refill  . acetaminophen (TYLENOL) 325 MG tablet Take 650 mg by mouth every 6 (six) hours as needed for mild pain or fever.      . famotidine (PEPCID) 20 MG tablet Take 20 mg by mouth 2 (two) times daily.      . furosemide (LASIX) 20  MG tablet Take 20 mg by mouth daily.      Marland Kitchen ibuprofen (ADVIL,MOTRIN) 200 MG tablet Take 200-800 mg by mouth every 6 (six) hours as needed for fever or mild pain.      Marland Kitchen lisinopril-hydrochlorothiazide (PRINZIDE,ZESTORETIC) 20-25 MG per tablet Take 1 tablet by mouth daily.       Assessment: 29 YOF presented with gallstone pancreatitis with fever, chills, and vomiting found to have pyelonephritis and bacteremia. Patient was on ceftriaxone  which is now being switched to Zosyn. WBC elevated at 10.9. Tm 102.7. SCr is elevated at 1.56 while BL seems to be ~ 0.85. CrCl ~ 30 mL/min.   Cultures: 9/3 Blood Cx x2 : 2/2 Gram neg rods 9/3 Urine Cx: E. Coli (> 100K colonies)   Goal of Therapy:  Resolution of infection   Plan:  1) Start Zosyn 3.375 gm IV Q 8 hours  2) Monitor CBC, sensitivities, renal fx and patient's clinical progress 3) Narrow abx therapy based on sensitivities   Vinnie Level, PharmD.  Clinical Pharmacist Pager [phone]

## 2013-10-23 NOTE — Progress Notes (Signed)
CRITICAL VALUE ALERT  Critical value received:  Blood Cultures + for Gram Negative Rods  Date of notification: 10/23/2013  Time of notification:  0910  Critical value read back:Yes.    Nurse who received alert:  Leodis Binet  MD notified (1st page):  Dr. York Spaniel  Time of first page:  0915  Responding MD:  Dr. York Spaniel  Time MD responded:  [email or web address].Henderson (MD on the floor and made aware)

## 2013-10-23 NOTE — Progress Notes (Signed)
[facility] PROGRESS NOTE  Michelle Boyd WUJ:[id number] DOB: [birth year] DOA: 10/22/2013 PCP: Holland Commons, NP  Assessment/Plan: 63 y/o female with PMH of HTN, DJD, s/p recent lap chole (8/16) due to gallstone pancreatitis presented with fever, chills, and vomiting found to have UTI, pyelonephritis, bacteremia   1. Sepsis/UTI/pyelonpehritis/bactermia  -cont IV atx, IVF, blood cultures prelim: 1/2 GNR; urine cultures pend   2. AKI in the setting of sepsis/dehydration; +diuretics +ACE -cont IVF; monitor; US renal: no hydronephrosis. hold ACE, diuretics 3. Mild hyperbilirubinemia; recently Lap chole (8/16) -US liver: no focal mass or ductal; obtain MRCP;      Code Status: full Family Communication: d/w patient, her son (indicate person spoken with, relationship, and if by phone, the number) Disposition Plan: pend clinical imprvement    Consultants:  none  Procedures:  noen  Antibiotics:  Ceftriaxone 9/3<<<<    (indicate start date, and stop date if known)  HPI/Subjective: alert  Objective: Filed Vitals:   10/23/13 0519  BP: 142/62  Pulse: 102  Temp: 99.2 F (37.3 C)  Resp: 18    Intake/Output Summary (Last 24 hours) at 10/23/13 1136 Last data filed at 10/23/13 0915  Gross per 24 hour  Intake      0 ml  Output    425 ml  Net   -425 ml   Filed Weights   10/22/13 1603  Weight: 60.1 kg (132 lb 7.9 oz)    Exam:   General:  alert  Cardiovascular: s1,s2 rrr  Respiratory: CTA BL  Abdomen: soft, nt,nd   Musculoskeletal: no LE edema   Data Reviewed: Basic Metabolic Panel:  Recent Labs Lab 10/22/13 0922 10/23/13 0345  NA 132* 132*  K 3.2* 3.8  CL 96 99  CO2 20 19  GLUCOSE 143* 90  BUN 20 20  CREATININE 1.65* 1.56*  CALCIUM 8.3* 8.4   Liver Function Tests:  Recent Labs Lab 10/22/13 0922 10/23/13 0345  AST 43* 38*  ALT 39* 37*  ALKPHOS 121* 124*  BILITOT 1.7* 1.2  PROT 7.1 6.9  ALBUMIN 2.9* 2.6*    Recent Labs Lab  10/22/13 0922  LIPASE 13   No results found for this basename: AMMONIA,  in the last 168 hours CBC:  Recent Labs Lab 10/22/13 0922 10/23/13 0345  WBC 12.7* 10.9*  NEUTROABS 10.8*  --   HGB 9.8* 9.8*  HCT 28.8* 28.9*  MCV 74.0* 74.7*  PLT 179 162   Cardiac Enzymes: No results found for this basename: CKTOTAL, CKMB, CKMBINDEX, TROPONINI,  in the last 168 hours BNP (last 3 results) No results found for this basename: PROBNP,  in the last 8760 hours CBG: No results found for this basename: GLUCAP,  in the last 168 hours  Recent Results (from the past 240 hour(s))  CULTURE, BLOOD (ROUTINE X 2)     Status: None   Collection Time    10/22/13  2:15 PM      Result Value Ref Range Status   Specimen Description BLOOD HAND LEFT   Final   Special Requests BOTTLES DRAWN AEROBIC AND ANAEROBIC 10CC   Final   Culture  Setup Time     Final   Value: 10/22/2013 19:56     Performed at [facility]   Culture     Final   Value:        BLOOD CULTURE RECEIVED NO GROWTH TO DATE CULTURE WILL BE HELD FOR 5 DAYS BEFORE ISSUING A FINAL NEGATIVE REPORT     Performed at  [facility]   Report Status PENDING   Incomplete  CULTURE, BLOOD (ROUTINE X 2)     Status: None   Collection Time    10/22/13  2:30 PM      Result Value Ref Range Status   Specimen Description BLOOD ARM RIGHT   Final   Special Requests BOTTLES DRAWN AEROBIC AND ANAEROBIC 10CC   Final   Culture  Setup Time     Final   Value: 10/22/2013 19:53     Performed at [facility]   Culture     Final   Value: GRAM NEGATIVE RODS     Note: Culture results may be compromised due to an excessive volume of blood received in culture bottles. Gram Stain Report Called to,Read Back By and Verified With: Leodis Binet @ [phone] ON [phone] BY [facility]     Performed at [facility]   Report Status PENDING   Incomplete     Studies: Ct Abdomen Pelvis Wo Contrast  10/22/2013   CLINICAL DATA:  Generalized abdominal pain. Fever.  Nausea. Dysuria. Cholecystectomy on 10/04/2013.  EXAM: CT ABDOMEN AND PELVIS WITHOUT CONTRAST  TECHNIQUE: Multidetector CT imaging of the abdomen and pelvis was performed following the standard protocol without IV contrast. Oral contrast was administered.  COMPARISON:  Enhanced CT abdomen and pelvis 10/02/2013.  FINDINGS: Interval cholecystectomy since the prior examination. No evidence of fluid collection in the cholecystectomy bed. Stable mild intra and extrahepatic biliary ductal dilation without obstructing stone or mass. Normal unenhanced appearance of the liver, spleen, pancreas and adrenal glands. Hyperdense cyst involving the lower pole of the left kidney approximating 1.2 cm. Edema involving the right kidney with associated perinephric edema. No evidence of urinary tract calculi on either side. Mild aortoiliac atherosclerosis without aneurysm. No significant lymphadenopathy.  Stomach decompressed and normal in appearance. Normal appearing small bowel. Entire colon relatively decompressed with expected stool burden. Upper normal caliber appendix measuring approximately 7 mm diameter without evidence of periappendiceal inflammation. No ascites.  Bone window images demonstrate generalized osseous demineralization, compression fracture of the upper endplate of L3 on the order of 15-20% or so (unchanged since lumbar spine x-rays 08/27/2013), degenerative disc disease and spondylosis at L5-S1, and facet degenerative changes involving the lower lumbar spine. Visualized lung bases clear apart from the expected dependent atelectasis posteriorly. Heart mildly enlarged.  IMPRESSION: 1. Pyelonephritis involving the right kidney. 2. No acute abnormalities otherwise involving the abdomen or pelvis. 3. Hyperdense proteinaceous or hemorrhagic cyst involving the lower pole of the left kidney. 4. Osseous findings as above.   Electronically Signed   By: Hulan Saas M.D.   On: 10/22/2013 13:08   Dg Chest 2  View  10/22/2013   CLINICAL DATA:  Productive cough with chest pain  EXAM: CHEST  2 VIEW  COMPARISON:  Portable chest x-ray of October 05, 2013 and September 22, 2012  FINDINGS: The lungs are better inflated today. The interstitial markings remain increased bilaterally. The cardiopericardial silhouette is top-normal in size. The pulmonary vascularity is indistinct. There is no pleural effusion. The bony thorax is unremarkable.  IMPRESSION: There are persistently increased interstitial marking markings not greatly changed from the prior study allowing for improved in aeration of the lungs today. This may reflect acute and chronic bronchitis or early interstitial pneumonia. CHF is felt less likely.   Electronically Signed   By: David  [place]   On: 10/22/2013 09:41   US Abdomen Complete  10/22/2013   CLINICAL DATA:  Elevated  hepatic function studies. History previous cholecystectomy; question possible common bile duct stone  EXAM: ULTRASOUND ABDOMEN COMPLETE  COMPARISON:  Abdominal pelvic CT scan of today's date.  FINDINGS: Gallbladder:  No gallstones or wall thickening visualized. No sonographic Murphy sign noted.  Common bile duct:  Diameter: 4.1 mm  Liver:  Limited evaluation due to bowel gas and patient's restlessness  IVC:  No abnormality visualized.  Pancreas:  Visualized portion unremarkable.  Spleen:  Size and appearance within normal limits.  Right Kidney:  Length: 10.1 cm. Echogenicity within limits of normal. No mass or hydronephrosis.  Left Kidney:  Length: 10.6 cm. Normal echogenicity where visualized. No hydronephrosis visualized.  Abdominal aorta:  No aneurysm visualized.  Other findings:  None.  IMPRESSION: 1. Evaluation of the liver is limited but there is no focal mass or ductal dilation. 2. The common bile duct is not dilated. No intraluminal filling defects are demonstrated. With respect to the concern over a common bile duct stone, MRCP would be useful if the patient can tolerate the procedure.    Electronically Signed   By: David  [place]   On: 10/22/2013 15:58    Scheduled Meds: . cefTRIAXone (ROCEPHIN)  IV  1 g Intravenous Q24H  . heparin  5,000 Units Subcutaneous 3 times per day   Continuous Infusions: . sodium chloride 75 mL/hr at 10/23/13 0906    Principal Problem:   Sepsis secondary to UTI Active Problems:   Hypertension   Hypokalemia   AKI (acute kidney injury)   Acute renal failure   Pyelonephritis, acute   Iron (Fe) deficiency anemia   Elevated LFTs    Time spent: >35 minutes     Esperanza Sheets  [facility] Pager [phone]. If 7PM-7AM, please contact night-coverage at [email or web address], password [facility] 10/23/2013, 11:36 AM  LOS: 1 day

## 2013-10-24 ENCOUNTER — Inpatient Hospital Stay (HOSPITAL_COMMUNITY): Payer: Medicaid Other

## 2013-10-24 LAB — URINE CULTURE: Colony Count: >=100000

## 2013-10-24 LAB — BASIC METABOLIC PANEL
Anion gap: 12 (ref 5–15)
BUN: 19 mg/dL (ref 6–23)
CO2: 21 meq/L (ref 19–32)
Calcium: 8.1 mg/dL — ABNORMAL LOW (ref 8.4–10.5)
Chloride: 105 mEq/L (ref 96–112)
Creatinine, Ser: 1.44 mg/dL — ABNORMAL HIGH (ref 0.50–1.10)
GFR calc Af Amer: 43 mL/min — ABNORMAL LOW (ref >90)
GFR, EST NON AFRICAN AMERICAN: 37 mL/min — AB (ref >90)
GLUCOSE: 124 mg/dL — AB (ref 70–99)
Potassium: 3.7 mEq/L (ref 3.7–5.3)
SODIUM: 138 meq/L (ref 137–147)

## 2013-10-24 LAB — CBC
HEMATOCRIT: 26.1 % — AB (ref 36.0–46.0)
Hemoglobin: 8.8 g/dL — ABNORMAL LOW (ref 12.0–15.0)
MCH: 25.7 pg — AB (ref 26.0–34.0)
MCHC: 33.7 g/dL (ref 30.0–36.0)
MCV: 76.1 fL — AB (ref 78.0–100.0)
PLATELETS: 140 10*3/uL — AB (ref 150–400)
RBC: 3.43 MIL/uL — ABNORMAL LOW (ref 3.87–5.11)
RDW: 16.6 % — ABNORMAL HIGH (ref 11.5–15.5)
WBC: 6.6 10*3/uL (ref 4.0–10.5)

## 2013-10-24 MED ORDER — GADOBENATE DIMEGLUMINE 529 MG/ML IV SOLN
6.0000 mL | Freq: Once | INTRAVENOUS | Status: AC | PRN
  Administered 2013-10-24: 6 mL via INTRAVENOUS

## 2013-10-24 NOTE — Progress Notes (Signed)
[facility] PROGRESS NOTE  Michelle Boyd ZOX:[id number] DOB: September 20, 1950 DOA: 10/22/2013 PCP: [facility], NP  Assessment/Plan: 63 y/o female with PMH of HTN, DJD, s/p recent lap chole (8/16) due to gallstone pancreatitis presented with fever, chills, and vomiting found to have UTI, pyelonephritis, bacteremia   1. Sepsis/UTI/pyelonpehritis/bacteremia; febrile -cont IV atx, IVF, blood cultures prelim: + GNR; urine cultures: Ecoli  2. AKI in the setting of sepsis/dehydration; +diuretics +ACE -cont IVF; monitor; US renal: no hydronephrosis. hold ACE, diuretics 3. Mild hyperbilirubinemia; recently Lap chole (8/16) -US liver: no focal mass or ductal; obtain MRCP;  4. Anemia chronic, no s/s of acute bleeding; start iron; recheck Hg   Code Status: full Family Communication: d/w patient, her son (indicate person spoken with, relationship, and if by phone, the number) Disposition Plan: pend clinical imprvement    Consultants:  none  Procedures:  none  Antibiotics:  Ceftriaxone 9/3<<<<    (indicate start date, and stop date if known)  HPI/Subjective: alert  Objective: Filed Vitals:   10/24/13 0730  BP:   Pulse:   Temp: 98.8 F (37.1 C)  Resp:     Intake/Output Summary (Last 24 hours) at 10/24/13 0943 Last data filed at 10/24/13 0800  Gross per 24 hour  Intake 3201.25 ml  Output    325 ml  Net 2876.25 ml   Filed Weights   10/22/13 1603  Weight: 60.1 kg (132 lb 7.9 oz)    Exam:   General:  alert  Cardiovascular: s1,s2 rrr  Respiratory: CTA BL  Abdomen: soft, nt,nd   Musculoskeletal: no LE edema   Data Reviewed: Basic Metabolic Panel:  Recent Labs Lab 10/22/13 0922 10/23/13 0345 10/24/13 0402  NA 132* 132* 138  K 3.2* 3.8 3.7  CL 96 99 105  CO2 GLUCOSE 143* 90 124*  BUN CREATININE 1.65* 1.56* 1.44*  CALCIUM 8.3* 8.4 8.1*   Liver Function Tests:  Recent Labs Lab 10/22/13 0922 10/23/13 0345  AST 43* 38*  ALT 39*  37*  ALKPHOS 121* 124*  BILITOT 1.7* 1.2  PROT 7.1 6.9  ALBUMIN 2.9* 2.6*    Recent Labs Lab 10/22/13 0922  LIPASE 13   No results found for this basename: AMMONIA,  in the last 168 hours CBC:  Recent Labs Lab 10/22/13 0922 10/23/13 0345 10/24/13 0402  WBC 12.7* 10.9* 6.6  NEUTROABS 10.8*  --   --   HGB 9.8* 9.8* 8.8*  HCT 28.8* 28.9* 26.1*  MCV 74.0* 74.7* 76.1*  PLT 179 162 140*   Cardiac Enzymes: No results found for this basename: CKTOTAL, CKMB, CKMBINDEX, TROPONINI,  in the last 168 hours BNP (last 3 results) No results found for this basename: PROBNP,  in the last 8760 hours CBG: No results found for this basename: GLUCAP,  in the last 168 hours  Recent Results (from the past 240 hour(s))  URINE CULTURE     Status: None   Collection Time    10/22/13 11:46 AM      Result Value Ref Range Status   Specimen Description URINE, CLEAN CATCH   Final   Special Requests NONE   Final   Culture  Setup Time     Final   Value: 10/22/2013 17:22     Performed at [facility] Count     Final   Value: >=100,000 COLONIES/ML     Performed at [facility]   Culture     Final  Value: ESCHERICHIA COLI     Performed at [facility]   Report Status PENDING   Incomplete  CULTURE, BLOOD (ROUTINE X 2)     Status: None   Collection Time    10/22/13  2:15 PM      Result Value Ref Range Status   Specimen Description BLOOD HAND LEFT   Final   Special Requests BOTTLES DRAWN AEROBIC AND ANAEROBIC 10CC   Final   Culture  Setup Time     Final   Value: 10/22/2013 19:56     Performed at [facility]   Culture     Final   Value: GRAM NEGATIVE RODS     Note: Gram Stain Report Called to,Read Back By and Verified With: Gregery Na @ 1456 ON [phone] BY [facility]     Performed at [facility]   Report Status PENDING   Incomplete  CULTURE, BLOOD (ROUTINE X 2)     Status: None   Collection Time    10/22/13  2:30 PM      Result Value Ref  Range Status   Specimen Description BLOOD ARM RIGHT   Final   Special Requests BOTTLES DRAWN AEROBIC AND ANAEROBIC 10CC   Final   Culture  Setup Time     Final   Value: 10/22/2013 19:53     Performed at [facility]   Culture     Final   Value: GRAM NEGATIVE RODS     Note: Culture results may be compromised due to an excessive volume of blood received in culture bottles. Gram Stain Report Called to,Read Back By and Verified With: Leodis Binet @ [phone] ON [phone] BY [facility]     Performed at [facility]   Report Status PENDING   Incomplete     Studies: Ct Abdomen Pelvis Wo Contrast  10/22/2013   CLINICAL DATA:  Generalized abdominal pain. Fever. Nausea. Dysuria. Cholecystectomy on 10/04/2013.  EXAM: CT ABDOMEN AND PELVIS WITHOUT CONTRAST  TECHNIQUE: Multidetector CT imaging of the abdomen and pelvis was performed following the standard protocol without IV contrast. Oral contrast was administered.  COMPARISON:  Enhanced CT abdomen and pelvis 10/02/2013.  FINDINGS: Interval cholecystectomy since the prior examination. No evidence of fluid collection in the cholecystectomy bed. Stable mild intra and extrahepatic biliary ductal dilation without obstructing stone or mass. Normal unenhanced appearance of the liver, spleen, pancreas and adrenal glands. Hyperdense cyst involving the lower pole of the left kidney approximating 1.2 cm. Edema involving the right kidney with associated perinephric edema. No evidence of urinary tract calculi on either side. Mild aortoiliac atherosclerosis without aneurysm. No significant lymphadenopathy.  Stomach decompressed and normal in appearance. Normal appearing small bowel. Entire colon relatively decompressed with expected stool burden. Upper normal caliber appendix measuring approximately 7 mm diameter without evidence of periappendiceal inflammation. No ascites.  Bone window images demonstrate generalized osseous demineralization, compression fracture of the  upper endplate of L3 on the order of 15-20% or so (unchanged since lumbar spine x-rays 08/27/2013), degenerative disc disease and spondylosis at L5-S1, and facet degenerative changes involving the lower lumbar spine. Visualized lung bases clear apart from the expected dependent atelectasis posteriorly. Heart mildly enlarged.  IMPRESSION: 1. Pyelonephritis involving the right kidney. 2. No acute abnormalities otherwise involving the abdomen or pelvis. 3. Hyperdense proteinaceous or hemorrhagic cyst involving the lower pole of the left kidney. 4. Osseous findings as above.   Electronically Signed   By: Hulan Saas M.D.   On: 10/22/2013  13:08   US Abdomen Complete  10/22/2013   CLINICAL DATA:  Elevated hepatic function studies. History previous cholecystectomy; question possible common bile duct stone  EXAM: ULTRASOUND ABDOMEN COMPLETE  COMPARISON:  Abdominal pelvic CT scan of today's date.  FINDINGS: Gallbladder:  No gallstones or wall thickening visualized. No sonographic Murphy sign noted.  Common bile duct:  Diameter: 4.1 mm  Liver:  Limited evaluation due to bowel gas and patient's restlessness  IVC:  No abnormality visualized.  Pancreas:  Visualized portion unremarkable.  Spleen:  Size and appearance within normal limits.  Right Kidney:  Length: 10.1 cm. Echogenicity within limits of normal. No mass or hydronephrosis.  Left Kidney:  Length: 10.6 cm. Normal echogenicity where visualized. No hydronephrosis visualized.  Abdominal aorta:  No aneurysm visualized.  Other findings:  None.  IMPRESSION: 1. Evaluation of the liver is limited but there is no focal mass or ductal dilation. 2. The common bile duct is not dilated. No intraluminal filling defects are demonstrated. With respect to the concern over a common bile duct stone, MRCP would be useful if the patient can tolerate the procedure.   Electronically Signed   By: David  [place]   On: 10/22/2013 15:58    Scheduled Meds: . heparin  5,000 Units  Subcutaneous 3 times per day  . piperacillin-tazobactam (ZOSYN)  IV  3.375 g Intravenous Q8H   Continuous Infusions: . sodium chloride 75 mL/hr at 10/24/13 0017    Principal Problem:   Sepsis secondary to UTI Active Problems:   Hypertension   Hypokalemia   AKI (acute kidney injury)   Acute renal failure   Pyelonephritis, acute   Iron (Fe) deficiency anemia   Elevated LFTs    Time spent: >35 minutes     Esperanza Sheets  [facility] Pager [phone]. If 7PM-7AM, please contact night-coverage at [email or web address], password [facility] 10/24/2013, 9:43 AM  LOS: 2 days

## 2013-10-25 DIAGNOSIS — E876 Hypokalemia: Secondary | ICD-10-CM

## 2013-10-25 LAB — CBC
HEMATOCRIT: 25.5 % — AB (ref 36.0–46.0)
HEMOGLOBIN: 8.5 g/dL — AB (ref 12.0–15.0)
MCH: 25.3 pg — ABNORMAL LOW (ref 26.0–34.0)
MCHC: 33.3 g/dL (ref 30.0–36.0)
MCV: 75.9 fL — AB (ref 78.0–100.0)
Platelets: 147 10*3/uL — ABNORMAL LOW (ref 150–400)
RBC: 3.36 MIL/uL — ABNORMAL LOW (ref 3.87–5.11)
RDW: 16.9 % — ABNORMAL HIGH (ref 11.5–15.5)
WBC: 6.5 10*3/uL (ref 4.0–10.5)

## 2013-10-25 LAB — CULTURE, BLOOD (ROUTINE X 2)

## 2013-10-25 LAB — COMPREHENSIVE METABOLIC PANEL
ALK PHOS: 102 U/L (ref 39–117)
ALT: 34 U/L (ref 0–35)
AST: 43 U/L — ABNORMAL HIGH (ref 0–37)
Albumin: 2.2 g/dL — ABNORMAL LOW (ref 3.5–5.2)
Anion gap: 12 (ref 5–15)
BUN: 14 mg/dL (ref 6–23)
CHLORIDE: 103 meq/L (ref 96–112)
CO2: 20 mEq/L (ref 19–32)
Calcium: 8 mg/dL — ABNORMAL LOW (ref 8.4–10.5)
Creatinine, Ser: 1.04 mg/dL (ref 0.50–1.10)
GFR, EST AFRICAN AMERICAN: 64 mL/min — AB (ref >90)
GFR, EST NON AFRICAN AMERICAN: 55 mL/min — AB (ref >90)
GLUCOSE: 87 mg/dL (ref 70–99)
POTASSIUM: 3.4 meq/L — AB (ref 3.7–5.3)
Sodium: 135 mEq/L — ABNORMAL LOW (ref 137–147)
Total Bilirubin: 0.7 mg/dL (ref 0.3–1.2)
Total Protein: 6.3 g/dL (ref 6.0–8.3)

## 2013-10-25 MED ORDER — SODIUM CHLORIDE 0.9 % IV SOLN
250.0000 mg | Freq: Four times a day (QID) | INTRAVENOUS | Status: DC
  Administered 2013-10-25 – 2013-10-26 (×5): 250 mg via INTRAVENOUS
  Filled 2013-10-25 (×7): qty 250

## 2013-10-25 NOTE — Progress Notes (Signed)
[facility] PROGRESS NOTE  Michelle Boyd WUJ:[id number] DOB: [birth year] DOA: 10/22/2013 PCP: Holland Commons, NP  Assessment/Plan: 63 y/o female with PMH of HTN, DJD, s/p recent lap chole (8/16) due to gallstone pancreatitis presented with fever, chills, and vomiting found to have UTI, pyelonephritis, bacteremia   1. Sepsis/UTI/pyelonpehritis/bacteremia; febrile; sepsis physiology resolved  -improving on IV atx-zosyn, blood cultures: + GNR; urine cultures: Ecoli-esbl; atx changed to primaxin;  -d/w ID, Dr. Ninetta Lights recommended to cont IV atx for 10 day-invanz; repeat blood culture if negative place PICC   2. AKI in the setting of sepsis/dehydration; +diuretics +ACE -improved on IVF; monitor; US renal: no hydronephrosis. hold ACE, diuretics 3. Mild hyperbilirubinemia; recently Lap chole (8/16) -US liver: no focal mass or ductal; MRCP: No evidence of postoperative fluid collection after  cholecystectomy  4. Anemia chronic, no s/s of acute bleeding; start iron; recheck Hg  Need HHC, IV atx upon d/c   Code Status: full Family Communication: d/w patient, her son, family at the bedside (indicate person spoken with, relationship, and if by phone, the number) Disposition Plan: pend clinical imprvement    Consultants:  none  Procedures:  none  Antibiotics:  Ceftriaxone 9/3<<<<    (indicate start date, and stop date if known)  HPI/Subjective: alert  Objective: Filed Vitals:   10/25/13 0505  BP: 122/53  Pulse: 88  Temp: 98.1 F (36.7 C)  Resp: 16    Intake/Output Summary (Last 24 hours) at 10/25/13 1118 Last data filed at 10/24/13 2015  Gross per 24 hour  Intake  937.5 ml  Output    550 ml  Net  387.5 ml   Filed Weights   10/22/13 1603  Weight: 60.1 kg (132 lb 7.9 oz)    Exam:   General:  alert  Cardiovascular: s1,s2 rrr  Respiratory: CTA BL  Abdomen: soft, nt,nd   Musculoskeletal: no LE edema   Data Reviewed: Basic Metabolic Panel:  Recent Labs Lab  10/22/13 0922 10/23/13 0345 10/24/13 0402 10/25/13 0344  NA 132* 132* 138 135*  K 3.2* 3.8 3.7 3.4*  CL 96 99 105 103  CO2 GLUCOSE 143* 90 124* 87  BUN CREATININE 1.65* 1.56* 1.44* 1.04  CALCIUM 8.3* 8.4 8.1* 8.0*   Liver Function Tests:  Recent Labs Lab 10/22/13 0922 10/23/13 0345 10/25/13 0344  AST 43* 38* 43*  ALT 39* 37* 34  ALKPHOS 121* 124* 102  BILITOT 1.7* 1.2 0.7  PROT 7.1 6.9 6.3  ALBUMIN 2.9* 2.6* 2.2*    Recent Labs Lab 10/22/13 0922  LIPASE 13   No results found for this basename: AMMONIA,  in the last 168 hours CBC:  Recent Labs Lab 10/22/13 0922 10/23/13 0345 10/24/13 0402 10/25/13 0344  WBC 12.7* 10.9* 6.6 6.5  NEUTROABS 10.8*  --   --   --   HGB 9.8* 9.8* 8.8* 8.5*  HCT 28.8* 28.9* 26.1* 25.5*  MCV 74.0* 74.7* 76.1* 75.9*  PLT 179 162 140* 147*   Cardiac Enzymes: No results found for this basename: CKTOTAL, CKMB, CKMBINDEX, TROPONINI,  in the last 168 hours BNP (last 3 results) No results found for this basename: PROBNP,  in the last 8760 hours CBG: No results found for this basename: GLUCAP,  in the last 168 hours  Recent Results (from the past 240 hour(s))  URINE CULTURE     Status: None   Collection Time    10/22/13 11:46 AM      Result  Value Ref Range Status   Specimen Description URINE, CLEAN CATCH   Final   Special Requests NONE   Final   Culture  Setup Time     Final   Value: 10/22/2013 17:22     Performed at [facility] Count     Final   Value: >=100,000 COLONIES/ML     Performed at [facility]   Culture     Final   Value: ESCHERICHIA COLI     Note: Confirmed Extended Spectrum Beta-Lactamase Producer (ESBL)     Performed at [facility]   Report Status 2013-11-07 FINAL   Final   Organism ID, Bacteria ESCHERICHIA COLI   Final  CULTURE, BLOOD (ROUTINE X 2)     Status: None   Collection Time    10/22/13  2:15 PM      Result Value Ref Range Status    Specimen Description BLOOD HAND LEFT   Final   Special Requests BOTTLES DRAWN AEROBIC AND ANAEROBIC 10CC   Final   Culture  Setup Time     Final   Value: 10/22/2013 19:56     Performed at [facility]   Culture     Final   Value: ESCHERICHIA COLI     Note: Confirmed Extended Spectrum Beta-Lactamase Producer (ESBL) CRITICAL RESULT CALLED TO, READ BACK BY AND VERIFIED WITH: BRANDON FOUST 10/25/13 @ 9:01AM BY RUSCOE A.     Note: Gram Stain Report Called to,Read Back By and Verified With: Gregery Na @ 1456 ON [phone] BY [facility]     Performed at [facility]   Report Status 10/25/2013 FINAL   Final   Organism ID, Bacteria ESCHERICHIA COLI   Final  CULTURE, BLOOD (ROUTINE X 2)     Status: None   Collection Time    10/22/13  2:30 PM      Result Value Ref Range Status   Specimen Description BLOOD ARM RIGHT   Final   Special Requests BOTTLES DRAWN AEROBIC AND ANAEROBIC 10CC   Final   Culture  Setup Time     Final   Value: 10/22/2013 19:53     Performed at [facility]   Culture     Final   Value: ESCHERICHIA COLI     Note: Confirmed Extended Spectrum Beta-Lactamase Producer (ESBL) CRITICAL RESULT CALLED TO, READ BACK BY AND VERIFIED WITH: BRANDON FOUST 10/25/13 @ 9:01AM BY R[facility]COE A.     Note: Culture results may be compromised due to an excessive volume of blood received in culture bottles. Gram Stain Report Called to,Read Back By and Verified With: Leodis Binet @ [phone] ON [phone] BY [facility]     Performed at [facility]   Report Status 10/25/2013 FINAL   Final     Studies: Mr 3d Recon At Scanner  07-Nov-2013   CLINICAL DATA:  Cholecystectomy on 08/16. Pain. Acute renal failure. Elevated liver function tests.  EXAM: MRI ABDOMEN WITHOUT AND WITH CONTRAST (INCLUDING MRCP)  TECHNIQUE: Multiplanar multisequence MR imaging of the abdomen was performed both before and after the administration of intravenous contrast. Heavily T2-weighted images of the biliary and pancreatic ducts  were obtained, and three-dimensional MRCP images were rendered by post processing.  CONTRAST:  6mL MULTIHANCE GADOBENATE DIMEGLUMINE 529 MG/ML IV SOLN  COMPARISON:  CT 10/22/2013 and 10/02/2013 ultrasound 10/22/2013.  FINDINGS: Portions of the exam, especially the pre and post contrast dynamic series, are motion degraded. The post-contrast dynamic images are essentially nondiagnostic.  Mild cardiomegaly, without pericardial or pleural effusion.  No suspicious liver lesion. Mild intrahepatic biliary ductal dilatation. Normal spleen, stomach. Pancreatic duct is upper normal, 4 mm on image 43 of series 7.  Interpolar left renal lesion measures 1.0 cm and demonstrates precontrast T1 hyperintensity. T2 hypointense. Suboptimally evaluated after contrast.  The right kidney demonstrates enlargement and heterogeneous T2 signal, most consistent with pyelonephritis.  Status post cholecystectomy, without postoperative fluid collection. The common duct is normal for prior cholecystectomy state. Example 7 mm on image 35/series 7. No evidence of obstructive stone or mass. Normal abdominal bowel loops, without ascites. Anasarca is slightly greater on the right and may be postoperative partially.  IMPRESSION: 1. Mild intrahepatic biliary ductal dilatation without evidence of common duct dilatation or choledocholithiasis. 2. No evidence of postoperative fluid collection after cholecystectomy. 3. Heterogeneous right renal signal, most consistent with pyelonephritis. 4. Moderately degraded exam, especially pertaining to the post-contrast dynamic series. 5. Left renal lesion which is indeterminate but favored to represent a hemorrhagic cyst. Consider ultrasound follow-up at 1 year.   Electronically Signed   By: Jeronimo Greaves M.D.   On: 10/24/2013 12:24   Mr Abd W/wo Cm/mrcp  10/24/2013   CLINICAL DATA:  Cholecystectomy on 08/16. Pain. Acute renal failure. Elevated liver function tests.  EXAM: MRI ABDOMEN WITHOUT AND WITH CONTRAST  (INCLUDING MRCP)  TECHNIQUE: Multiplanar multisequence MR imaging of the abdomen was performed both before and after the administration of intravenous contrast. Heavily T2-weighted images of the biliary and pancreatic ducts were obtained, and three-dimensional MRCP images were rendered by post processing.  CONTRAST:  6mL MULTIHANCE GADOBENATE DIMEGLUMINE 529 MG/ML IV SOLN  COMPARISON:  CT 10/22/2013 and 10/02/2013 ultrasound 10/22/2013.  FINDINGS: Portions of the exam, especially the pre and post contrast dynamic series, are motion degraded. The post-contrast dynamic images are essentially nondiagnostic.  Mild cardiomegaly, without pericardial or pleural effusion.  No suspicious liver lesion. Mild intrahepatic biliary ductal dilatation. Normal spleen, stomach. Pancreatic duct is upper normal, 4 mm on image 43 of series 7.  Interpolar left renal lesion measures 1.0 cm and demonstrates precontrast T1 hyperintensity. T2 hypointense. Suboptimally evaluated after contrast.  The right kidney demonstrates enlargement and heterogeneous T2 signal, most consistent with pyelonephritis.  Status post cholecystectomy, without postoperative fluid collection. The common duct is normal for prior cholecystectomy state. Example 7 mm on image 35/series 7. No evidence of obstructive stone or mass. Normal abdominal bowel loops, without ascites. Anasarca is slightly greater on the right and may be postoperative partially.  IMPRESSION: 1. Mild intrahepatic biliary ductal dilatation without evidence of common duct dilatation or choledocholithiasis. 2. No evidence of postoperative fluid collection after cholecystectomy. 3. Heterogeneous right renal signal, most consistent with pyelonephritis. 4. Moderately degraded exam, especially pertaining to the post-contrast dynamic series. 5. Left renal lesion which is indeterminate but favored to represent a hemorrhagic cyst. Consider ultrasound follow-up at 1 year.   Electronically Signed   By: Jeronimo Greaves M.D.   On: 10/24/2013 12:24    Scheduled Meds: . heparin  5,000 Units Subcutaneous 3 times per day  . imipenem-cilastatin  250 mg Intravenous Q6H   Continuous Infusions: . sodium chloride 75 mL/hr at 10/25/13 0919    Principal Problem:   Sepsis secondary to UTI Active Problems:   Hypertension   Hypokalemia   AKI (acute kidney injury)   Acute renal failure   Pyelonephritis, acute   Iron (Fe) deficiency anemia   Elevated LFTs    Time spent: >35 minutes  Esperanza Sheets  [facility] Pager [phone]. If 7PM-7AM, please contact night-coverage at [email or web address], password [facility] 10/25/2013, 11:18 AM  LOS: 3 days

## 2013-10-25 NOTE — Progress Notes (Signed)
CRITICAL VALUE ALERT  Critical value received: blood culture  Date of notification:  10/25/2013  Time of notification:  0859  Critical value read back:Yes.    Nurse who received alert:  Ishmael Holter  MD notified (1st page):  Franco Nones, MD  Time of first page:  0906  MD notified (2nd page):  Time of second page:  Responding MD:  Franco Nones, MD  Time MD responded: [phone]

## 2013-10-25 NOTE — Progress Notes (Signed)
ANTIBIOTIC CONSULT NOTE  Pharmacy Consult for imipenem Indication: Sepsis   No Known Allergies  Patient Measurements: Height:  (149.9 cm) Weight: 132 lb 7.9 oz (60.1 kg) IBW/kg (Calculated) : 43.2 Adjusted Body Weight: n/a   Vital Signs: Temp: 98.1 F (36.7 C) (09/06 0505) BP: 122/53 mmHg (09/06 0505) Pulse Rate: 88 (09/06 0505) Intake/Output from previous day: 09/05 0701 - 09/06 0700 In: 937.5 [P.O.:240; I.V.:647.5; IV Piggyback:50] Out: 550 [Urine:550] Intake/Output from this shift:    Labs:  Recent Labs  10/23/13 0345 10/24/13 0402 10/25/13 0344  WBC 10.9* 6.6 6.5  HGB 9.8* 8.8* 8.5*  PLT 162 140* 147*  CREATININE 1.56* 1.44* 1.04   Estimated Creatinine Clearance: 42.6 ml/min (by C-G formula based on Cr of 1.04). No results found for this basename: VANCOTROUGH, Leodis Binet, VANCORANDOM, GENTTROUGH, GENTPEAK, GENTRANDOM, TOBRATROUGH, TOBRAPEAK, TOBRARND, AMIKACINPEAK, AMIKACINTROU, AMIKACIN,  in the last 72 hours   Microbiology: Recent Results (from the past 720 hour(s))  SURGICAL PCR SCREEN     Status: None   Collection Time    10/03/13  5:01 AM      Result Value Ref Range Status   MRSA, PCR NEGATIVE  NEGATIVE Final   Staphylococcus aureus NEGATIVE  NEGATIVE Final   Comment:            The Xpert SA Assay (FDA     approved for NASAL specimens     in patients over 62 years of age),     is one component of     a comprehensive surveillance     program.  Test performance has     been validated by [employer] for patients greater     than or equal to 17 year old.     It is not intended     to diagnose infection nor to     guide or monitor treatment.  CULTURE, BLOOD (ROUTINE X 2)     Status: None   Collection Time    10/03/13 11:07 PM      Result Value Ref Range Status   Specimen Description BLOOD LEFT ARM   Final   Special Requests BOTTLES DRAWN AEROBIC AND ANAEROBIC 10CC EACH   Final   Culture  Setup Time     Final   Value: 10/04/2013 12:23     Performed at [facility]   Culture     Final   Value: NO GROWTH 5 DAYS     Performed at [facility]   Report Status 10/10/2013 FINAL   Final  CULTURE, BLOOD (ROUTINE X 2)     Status: None   Collection Time    10/03/13 11:13 PM      Result Value Ref Range Status   Specimen Description BLOOD LEFT HAND   Final   Special Requests BOTTLES DRAWN AEROBIC AND ANAEROBIC 10CC    Final   Culture  Setup Time     Final   Value: 10/04/2013 12:23     Performed at [facility]   Culture     Final   Value: NO GROWTH 5 DAYS     Performed at [facility]   Report Status 10/10/2013 FINAL   Final  URINE CULTURE     Status: None   Collection Time    10/04/13 12:00 PM      Result Value Ref Range Status   Specimen Description URINE, RANDOM   Final   Special Requests NONE   Final  Culture  Setup Time     Final   Value: 10/05/2013 02:08     Performed at [facility] Count     Final   Value: NO GROWTH     Performed at [facility]   Culture     Final   Value: NO GROWTH     Performed at [facility]   Report Status 10/06/2013 FINAL   Final  URINE CULTURE     Status: None   Collection Time    10/22/13 11:46 AM      Result Value Ref Range Status   Specimen Description URINE, CLEAN CATCH   Final   Special Requests NONE   Final   Culture  Setup Time     Final   Value: 10/22/2013 17:22     Performed at [facility] Count     Final   Value: >=100,000 COLONIES/ML     Performed at [facility]   Culture     Final   Value: ESCHERICHIA COLI     Note: Confirmed Extended Spectrum Beta-Lactamase Producer (ESBL)     Performed at [facility]   Report Status 10/24/2013 FINAL   Final   Organism ID, Bacteria ESCHERICHIA COLI   Final  CULTURE, BLOOD (ROUTINE X 2)     Status: None   Collection Time    10/22/13  2:15 PM      Result Value Ref Range Status   Specimen Description BLOOD HAND LEFT   Final    Special Requests BOTTLES DRAWN AEROBIC AND ANAEROBIC 10CC   Final   Culture  Setup Time     Final   Value: 10/22/2013 19:56     Performed at [facility]   Culture     Final   Value: ESCHERICHIA COLI     Note: Gram Stain Report Called to,Read Back By and Verified With: Gregery Na @ 1456 ON [phone] BY [facility]     Performed at [facility]   Report Status PENDING   Incomplete  CULTURE, BLOOD (ROUTINE X 2)     Status: None   Collection Time    10/22/13  2:30 PM      Result Value Ref Range Status   Specimen Description BLOOD ARM RIGHT   Final   Special Requests BOTTLES DRAWN AEROBIC AND ANAEROBIC 10CC   Final   Culture  Setup Time     Final   Value: 10/22/2013 19:53     Performed at [facility]   Culture     Final   Value: GRAM NEGATIVE RODS     Note: Culture results may be compromised due to an excessive volume of blood received in culture bottles. Gram Stain Report Called to,Read Back By and Verified With: Leodis Binet @ [phone] ON [phone] BY [facility]     Performed at [facility]   Report Status PENDING   Incomplete    Medical History: Past Medical History  Diagnosis Date  . Hypertension   . Chronic knee pain   . GERD (gastroesophageal reflux disease)   . Gallstone pancreatitis   . Cholelithiasis     Medications:  Prescriptions prior to admission  Medication Sig Dispense Refill  . acetaminophen (TYLENOL) 325 MG tablet Take 650 mg by mouth every 6 (six) hours as needed for mild pain or fever.      . famotidine (PEPCID) 20 MG tablet Take 20 mg by mouth  2 (two) times daily.      . furosemide (LASIX) 20 MG tablet Take 20 mg by mouth daily.      Marland Kitchen ibuprofen (ADVIL,MOTRIN) 200 MG tablet Take 200-800 mg by mouth every 6 (six) hours as needed for fever or mild pain.      Marland Kitchen lisinopril-hydrochlorothiazide (PRINZIDE,ZESTORETIC) 20-25 MG per tablet Take 1 tablet by mouth daily.       Assessment: 62 YOF presented with gallstone pancreatitis with fever, chills,  and vomiting found to have pyelonephritis and bacteremia. Patient was on ceftriaxone which was switched to Zosyn. Culture positive for ESBL E coli so will change to imipenem.    Cultures: 9/3 Blood Cx x2 : 2/2 Gram neg rods  Ecoli  9/3 Urine Cx: E. Coli (> 100K colonies)  ESBL  Goal of Therapy:  Resolution of infection   Plan:  1) Primaxin 250 mg IV q6 hours 2) Monitor CBC, sensitivities, renal fx and patient's clinical progress   Talbert Cage, PharmD.  Clinical Pharmacist Pager [phone]

## 2013-10-26 LAB — CBC
HCT: 24.4 % — ABNORMAL LOW (ref 36.0–46.0)
Hemoglobin: 8.3 g/dL — ABNORMAL LOW (ref 12.0–15.0)
MCH: 24.9 pg — ABNORMAL LOW (ref 26.0–34.0)
MCHC: 34 g/dL (ref 30.0–36.0)
MCV: 73.3 fL — AB (ref 78.0–100.0)
PLATELETS: 161 10*3/uL (ref 150–400)
RBC: 3.33 MIL/uL — ABNORMAL LOW (ref 3.87–5.11)
RDW: 16.6 % — ABNORMAL HIGH (ref 11.5–15.5)
WBC: 6.4 10*3/uL (ref 4.0–10.5)

## 2013-10-26 MED ORDER — FERROUS SULFATE 325 (65 FE) MG PO TABS
325.0000 mg | ORAL_TABLET | Freq: Three times a day (TID) | ORAL | Status: DC
Start: 2013-10-26 — End: 2013-10-27
  Administered 2013-10-26 – 2013-10-27 (×4): 325 mg via ORAL
  Filled 2013-10-26 (×6): qty 1

## 2013-10-26 MED ORDER — SODIUM CHLORIDE 0.9 % IV SOLN
1.0000 g | INTRAVENOUS | Status: DC
  Administered 2013-10-26 – 2013-10-27 (×2): 1 g via INTRAVENOUS
  Filled 2013-10-26 (×2): qty 1

## 2013-10-26 NOTE — Progress Notes (Signed)
[facility] PROGRESS NOTE  Michelle Boyd VFI:[id number] DOB: 03-02-1950 DOA: 10/22/2013 PCP: Holland Commons, NP  Assessment/Plan: 63 y/o female with PMH of HTN, DJD, s/p recent lap chole (8/16) due to gallstone pancreatitis presented with fever, chills, and vomiting found to have UTI, pyelonephritis, bacteremia   1. Sepsis/UTI/pyelonpehritis/bacteremia; febrile; sepsis physiology resolved  -improving on IV atx-zosyn, blood cultures: + GNR; urine cultures: Ecoli-esbl; atx changed to Ertapenem;  -d/w ID, Dr. Ninetta Lights recommended to cont IV atx for 10 day-invanz; place PICC   2. AKI in the setting of sepsis/dehydration; +diuretics +ACE -improved on IVF; monitor; US renal: no hydronephrosis. hold ACE, diuretics 3. Mild hyperbilirubinemia; recently Lap chole (8/16) -US liver: no focal mass or ductal; MRCP: No evidence of postoperative fluid collection after  cholecystectomy  4. Anemia chronic, no s/s of acute bleeding; start iron; outpatient follow up for endoscopy;   Need HHC, IV atx upon d/c   Code Status: full Family Communication: d/w patient, her son, family at the bedside (indicate person spoken with, relationship, and if by phone, the number) Disposition Plan: pend clinical imprvement    Consultants:  none  Procedures:  none  Antibiotics:  Ceftriaxone 9/3<<<<    (indicate start date, and stop date if known)  HPI/Subjective: alert  Objective: Filed Vitals:   10/26/13 0536  BP: 132/67  Pulse: 77  Temp: 99.6 F (37.6 C)  Resp: 16    Intake/Output Summary (Last 24 hours) at 10/26/13 1017 Last data filed at 10/25/13 1828  Gross per 24 hour  Intake    238 ml  Output      0 ml  Net    238 ml   Filed Weights   10/22/13 1603  Weight: 60.1 kg (132 lb 7.9 oz)    Exam:   General:  alert  Cardiovascular: s1,s2 rrr  Respiratory: CTA BL  Abdomen: soft, nt,nd   Musculoskeletal: no LE edema   Data Reviewed: Basic Metabolic Panel:  Recent Labs Lab  10/22/13 0922 10/23/13 0345 10/24/13 0402 10/25/13 0344  NA 132* 132* 138 135*  K 3.2* 3.8 3.7 3.4*  CL 96 99 105 103  CO2 GLUCOSE 143* 90 124* 87  BUN CREATININE 1.65* 1.56* 1.44* 1.04  CALCIUM 8.3* 8.4 8.1* 8.0*   Liver Function Tests:  Recent Labs Lab 10/22/13 0922 10/23/13 0345 10/25/13 0344  AST 43* 38* 43*  ALT 39* 37* 34  ALKPHOS 121* 124* 102  BILITOT 1.7* 1.2 0.7  PROT 7.1 6.9 6.3  ALBUMIN 2.9* 2.6* 2.2*    Recent Labs Lab 10/22/13 0922  LIPASE 13   No results found for this basename: AMMONIA,  in the last 168 hours CBC:  Recent Labs Lab 10/22/13 0922 10/23/13 0345 10/24/13 0402 10/25/13 0344 10/26/13 0341  WBC 12.7* 10.9* 6.6 6.5 6.4  NEUTROABS 10.8*  --   --   --   --   HGB 9.8* 9.8* 8.8* 8.5* 8.3*  HCT 28.8* 28.9* 26.1* 25.5* 24.4*  MCV 74.0* 74.7* 76.1* 75.9* 73.3*  PLT 179 162 140* 147* 161   Cardiac Enzymes: No results found for this basename: CKTOTAL, CKMB, CKMBINDEX, TROPONINI,  in the last 168 hours BNP (last 3 results) No results found for this basename: PROBNP,  in the last 8760 hours CBG: No results found for this basename: GLUCAP,  in the last 168 hours  Recent Results (from the past 240 hour(s))  URINE CULTURE     Status: None  Collection Time    10/22/13 11:46 AM      Result Value Ref Range Status   Specimen Description URINE, CLEAN CATCH   Final   Special Requests NONE   Final   Culture  Setup Time     Final   Value: 10/22/2013 17:22     Performed at [facility] Count     Final   Value: >=100,000 COLONIES/ML     Performed at [facility]   Culture     Final   Value: ESCHERICHIA COLI     Note: Confirmed Extended Spectrum Beta-Lactamase Producer (ESBL)     Performed at [facility]   Report Status 10/24/2013 FINAL   Final   Organism ID, Bacteria ESCHERICHIA COLI   Final  CULTURE, BLOOD (ROUTINE X 2)     Status: None   Collection Time    10/22/13  2:15 PM       Result Value Ref Range Status   Specimen Description BLOOD HAND LEFT   Final   Special Requests BOTTLES DRAWN AEROBIC AND ANAEROBIC 10CC   Final   Culture  Setup Time     Final   Value: 10/22/2013 19:56     Performed at [facility]   Culture     Final   Value: ESCHERICHIA COLI     Note: Confirmed Extended Spectrum Beta-Lactamase Producer (ESBL) CRITICAL RESULT CALLED TO, READ BACK BY AND VERIFIED WITH: BRANDON FOUST 10/25/13 @ 9:01AM BY RUSCOE A.     Note: Gram Stain Report Called to,Read Back By and Verified With: Gregery Na @ 1456 ON [phone] BY [facility]     Performed at [facility]   Report Status 10/25/2013 FINAL   Final   Organism ID, Bacteria ESCHERICHIA COLI   Final  CULTURE, BLOOD (ROUTINE X 2)     Status: None   Collection Time    10/22/13  2:30 PM      Result Value Ref Range Status   Specimen Description BLOOD ARM RIGHT   Final   Special Requests BOTTLES DRAWN AEROBIC AND ANAEROBIC 10CC   Final   Culture  Setup Time     Final   Value: 10/22/2013 19:53     Performed at [facility]   Culture     Final   Value: ESCHERICHIA COLI     Note: Confirmed Extended Spectrum Beta-Lactamase Producer (ESBL) CRITICAL RESULT CALLED TO, READ BACK BY AND VERIFIED WITH: BRANDON FOUST 10/25/13 @ 9:01AM BY RUSCOE A.     Note: Culture results may be compromised due to an excessive volume of blood received in culture bottles. Gram Stain Report Called to,Read Back By and Verified With: Leodis Binet @ [phone] ON [phone] BY [facility]     Performed at [facility]   Report Status 10/25/2013 FINAL   Final  CULTURE, BLOOD (ROUTINE X 2)     Status: None   Collection Time    10/25/13 12:20 PM      Result Value Ref Range Status   Specimen Description BLOOD RIGHT ARM   Final   Special Requests BOTTLES DRAWN AEROBIC AND ANAEROBIC 5 CC   Final   Culture  Setup Time     Final   Value: 10/25/2013 19:17     Performed at [facility]   Culture     Final   Value:         BLOOD CULTURE RECEIVED NO GROWTH TO DATE CULTURE WILL  BE HELD FOR 5 DAYS BEFORE ISSUING A FINAL NEGATIVE REPORT     Performed at [facility]   Report Status PENDING   Incomplete  CULTURE, BLOOD (ROUTINE X 2)     Status: None   Collection Time    10/25/13 12:25 PM      Result Value Ref Range Status   Specimen Description BLOOD RIGHT ARM   Final   Special Requests BOTTLES DRAWN AEROBIC AND ANAEROBIC 5 CC   Final   Culture  Setup Time     Final   Value: 10/25/2013 19:17     Performed at [facility]   Culture     Final   Value:        BLOOD CULTURE RECEIVED NO GROWTH TO DATE CULTURE WILL BE HELD FOR 5 DAYS BEFORE ISSUING A FINAL NEGATIVE REPORT     Performed at [facility]   Report Status PENDING   Incomplete     Studies: No results found.  Scheduled Meds: . heparin  5,000 Units Subcutaneous 3 times per day  . imipenem-cilastatin  250 mg Intravenous Q6H   Continuous Infusions:    Principal Problem:   Sepsis secondary to UTI Active Problems:   Hypertension   Hypokalemia   AKI (acute kidney injury)   Acute renal failure   Pyelonephritis, acute   Iron (Fe) deficiency anemia   Elevated LFTs    Time spent: >35 minutes     Esperanza Sheets  [facility] Pager [phone]. If 7PM-7AM, please contact night-coverage at [email or web address], password [facility] 10/26/2013, 10:17 AM  LOS: 4 days

## 2013-10-26 NOTE — Care Management Note (Signed)
CARE MANAGEMENT NOTE 10/26/2013  Patient:  Michelle Boyd, Michelle Boyd   Account Number:  [id number]  Date Initiated:  10/26/2013  Documentation initiated by:  Johny Shock  Subjective/Objective Assessment:   CM following for progression and d/c planning     Action/Plan:   10/26/13 Noted plan for PICC and need for IV antibiotic and HHRN. [facility] notified of plan await PICC placement and orders for antibiotics.   Anticipated DC Date:     Anticipated DC Plan:  HOME W HOME HEALTH SERVICES         Choice offered to / List presented to:             Status of service:  In process, will continue to follow Medicare Important Message given?   (If response is "NO", the following Medicare IM given date fields will be blank) Date Medicare IM given:   Medicare IM given by:   Date Additional Medicare IM given:   Additional Medicare IM given by:    Discharge Disposition:    Per UR Regulation:    If discussed at Long Length of Stay Meetings, dates discussed:    Comments:  10/26/2013 This pt is familiar to this CM, noted plan to d/c with IV antibiotics, as CM, I am concerned about family ability to understand and assist this pt . During her last hospitalization they seemed unable to assist with meals etc. Johny Shock RN MPH, case manager, [phone]

## 2013-10-27 MED ORDER — SODIUM CHLORIDE 0.9 % IV SOLN: 1.0000 g | INTRAVENOUS | Status: AC

## 2013-10-27 MED ORDER — HEPARIN SOD (PORK) LOCK FLUSH 100 UNIT/ML IV SOLN
250.0000 [IU] | INTRAVENOUS | Status: AC | PRN
  Administered 2013-10-27: 250 [IU]

## 2013-10-27 MED ORDER — HYDRALAZINE HCL 25 MG PO TABS: 25.0000 mg | ORAL_TABLET | Freq: Three times a day (TID) | ORAL | Status: DC

## 2013-10-27 MED ORDER — FERROUS SULFATE 325 (65 FE) MG PO TABS: 325.0000 mg | ORAL_TABLET | Freq: Three times a day (TID) | ORAL | Status: DC

## 2013-10-27 MED ORDER — SODIUM CHLORIDE 0.9 % IJ SOLN: 10.0000 mL | INTRAMUSCULAR | Status: DC | PRN

## 2013-10-27 NOTE — Discharge Summary (Signed)
Physician Discharge Summary  Lennan Malone UEA:[id number] DOB: Sep 05, 1950 DOA: 10/22/2013  PCP: Holland Commons, NP  Admit date: 10/22/2013 Discharge date: 10/27/2013  Time spent: >35 minutes  Recommendations for Outpatient Follow-up:  HHC on IV atx  F/u with PCP in 1 week  Discharge Diagnoses:  Principal Problem:   Sepsis secondary to UTI Active Problems:   Hypertension   Hypokalemia   AKI (acute kidney injury)   Acute renal failure   Pyelonephritis, acute   Iron (Fe) deficiency anemia   Elevated LFTs   Discharge Condition: stable   Diet recommendation: low sodium  Filed Weights   10/22/13 1603  Weight: 60.1 kg (132 lb 7.9 oz)    History of present illness:  63 y/o female with PMH of HTN, DJD, s/p recent lap chole (8/16) due to gallstone pancreatitis presented with fever, chills, and vomiting found to have UTI, pyelonephritis, bacteremia   Hospital Course:  1. Sepsis/UTI/pyelonpehritis/bacteremia; febrile; sepsis physiology resolved  -improved on IV atx-zosyn, blood cultures: + GNR; urine cultures: Ecoli-esbl; atx changed to Ertapenem;  -d/w ID, Dr. Ninetta Lights recommended to cont IV atx for 10 day-invanz (last dose 11/05/13); repeat blood cultures negative; placed PICC  -HHC for IV atx  2. AKI in the setting of sepsis/dehydration; +diuretics +ACE  -improved on IVF; US renal: no hydronephrosis. hold ACE, diuretics; re-eval as outpatient in 1 week  3. Mild hyperbilirubinemia; recently Lap chole (8/16)  -US liver: no focal mass or ductal; MRCP: No evidence of postoperative fluid collection after  cholecystectomy  4. Anemia chronic, no s/s of acute bleeding; start iron; outpatient follow up for endoscopy;  5. HTN, BP stable off diuretics, ACE(hodl due to AKI); no s/s of fluid overload;  -start low dose hydralazine; outpatient follow up in 1 week to titrate as needed   D/w patient, son at the bedside   Procedures:  PICC (i.e. Studies not automatically included, echos, thoracentesis,  etc; not x-rays)  Consultations:  ID over the phone   Discharge Exam: Filed Vitals:   10/27/13 0535  BP: 147/63  Pulse: 74  Temp: 98.4 F (36.9 C)  Resp:     General: alert Cardiovascular: s1,s2 rrr Respiratory: CTA BL  Discharge Instructions  Discharge Instructions   Diet - low sodium heart healthy    Complete by:  As directed      Discharge instructions    Complete by:  As directed   Please follow up with primary care doctor in 1 week     Increase activity slowly    Complete by:  As directed             Medication List    STOP taking these medications       furosemide 20 MG tablet  Commonly known as:  LASIX     ibuprofen 200 MG tablet  Commonly known as:  ADVIL,MOTRIN     lisinopril-hydrochlorothiazide 20-25 MG per tablet  Commonly known as:  PRINZIDE,ZESTORETIC      TAKE these medications       acetaminophen 325 MG tablet  Commonly known as:  TYLENOL  Take 650 mg by mouth every 6 (six) hours as needed for mild pain or fever.     ertapenem 1 g in sodium chloride 0.9 % 50 mL  Inject 1 g into the vein daily.     famotidine 20 MG tablet  Commonly known as:  PEPCID  Take 20 mg by mouth 2 (two) times daily.     ferrous sulfate 325 (65 FE)  MG tablet  Take 1 tablet (325 mg total) by mouth 3 (three) times daily with meals.     hydrALAZINE 25 MG tablet  Commonly known as:  APRESOLINE  Take 1 tablet (25 mg total) by mouth 3 (three) times daily.       No Known Allergies     Follow-up Information   Follow up with Holland Commons, NP. Schedule an appointment as soon as possible for a visit in 1 week.   Specialty:  Internal Medicine   Contact information:   [address] [place] [address] [phone]        The results of significant diagnostics from this hospitalization (including imaging, microbiology, ancillary and laboratory) are listed below for reference.    Significant Diagnostic Studies: Ct Abdomen Pelvis Wo  Contrast  10/22/2013   CLINICAL DATA:  Generalized abdominal pain. Fever. Nausea. Dysuria. Cholecystectomy on 10/04/2013.  EXAM: CT ABDOMEN AND PELVIS WITHOUT CONTRAST  TECHNIQUE: Multidetector CT imaging of the abdomen and pelvis was performed following the standard protocol without IV contrast. Oral contrast was administered.  COMPARISON:  Enhanced CT abdomen and pelvis 10/02/2013.  FINDINGS: Interval cholecystectomy since the prior examination. No evidence of fluid collection in the cholecystectomy bed. Stable mild intra and extrahepatic biliary ductal dilation without obstructing stone or mass. Normal unenhanced appearance of the liver, spleen, pancreas and adrenal glands. Hyperdense cyst involving the lower pole of the left kidney approximating 1.2 cm. Edema involving the right kidney with associated perinephric edema. No evidence of urinary tract calculi on either side. Mild aortoiliac atherosclerosis without aneurysm. No significant lymphadenopathy.  Stomach decompressed and normal in appearance. Normal appearing small bowel. Entire colon relatively decompressed with expected stool burden. Upper normal caliber appendix measuring approximately 7 mm diameter without evidence of periappendiceal inflammation. No ascites.  Bone window images demonstrate generalized osseous demineralization, compression fracture of the upper endplate of L3 on the order of 15-20% or so (unchanged since lumbar spine x-rays 08/27/2013), degenerative disc disease and spondylosis at L5-S1, and facet degenerative changes involving the lower lumbar spine. Visualized lung bases clear apart from the expected dependent atelectasis posteriorly. Heart mildly enlarged.  IMPRESSION: 1. Pyelonephritis involving the right kidney. 2. No acute abnormalities otherwise involving the abdomen or pelvis. 3. Hyperdense proteinaceous or hemorrhagic cyst involving the lower pole of the left kidney. 4. Osseous findings as above.   Electronically Signed   By:  Hulan Saas M.D.   On: 10/22/2013 13:08   Dg Chest 2 View  10/22/2013   CLINICAL DATA:  Productive cough with chest pain  EXAM: CHEST  2 VIEW  COMPARISON:  Portable chest x-ray of October 05, 2013 and September 22, 2012  FINDINGS: The lungs are better inflated today. The interstitial markings remain increased bilaterally. The cardiopericardial silhouette is top-normal in size. The pulmonary vascularity is indistinct. There is no pleural effusion. The bony thorax is unremarkable.  IMPRESSION: There are persistently increased interstitial marking markings not greatly changed from the prior study allowing for improved in aeration of the lungs today. This may reflect acute and chronic bronchitis or early interstitial pneumonia. CHF is felt less likely.   Electronically Signed   By: David  [place]   On: 10/22/2013 09:41   Dg Cholangiogram Operative  10/04/2013   CLINICAL DATA:  Cholelithiasis  EXAM: INTRAOPERATIVE CHOLANGIOGRAM  TECHNIQUE: Cholangiographic images from the C-arm fluoroscopic device were submitted for interpretation post-operatively. Please see the procedural report for the amount of contrast and the fluoroscopy time utilized.  COMPARISON:  None.  FINDINGS: Contrast fills the biliary tree without evidence of common bile duct filling defects.  IMPRESSION: Patent biliary tree without evidence of common bile duct stones.   Electronically Signed   By: Maryclare Bean M.D.   On: 10/04/2013 09:29   US Abdomen Complete  10/22/2013   CLINICAL DATA:  Elevated hepatic function studies. History previous cholecystectomy; question possible common bile duct stone  EXAM: ULTRASOUND ABDOMEN COMPLETE  COMPARISON:  Abdominal pelvic CT scan of today's date.  FINDINGS: Gallbladder:  No gallstones or wall thickening visualized. No sonographic Murphy sign noted.  Common bile duct:  Diameter: 4.1 mm  Liver:  Limited evaluation due to bowel gas and patient's restlessness  IVC:  No abnormality visualized.  Pancreas:  Visualized  portion unremarkable.  Spleen:  Size and appearance within normal limits.  Right Kidney:  Length: 10.1 cm. Echogenicity within limits of normal. No mass or hydronephrosis.  Left Kidney:  Length: 10.6 cm. Normal echogenicity where visualized. No hydronephrosis visualized.  Abdominal aorta:  No aneurysm visualized.  Other findings:  None.  IMPRESSION: 1. Evaluation of the liver is limited but there is no focal mass or ductal dilation. 2. The common bile duct is not dilated. No intraluminal filling defects are demonstrated. With respect to the concern over a common bile duct stone, MRCP would be useful if the patient can tolerate the procedure.   Electronically Signed   By: David  [place]   On: 10/22/2013 15:58   Ct Abdomen Pelvis W Contrast  10/02/2013   CLINICAL DATA:  Right lower quadrant abdominal pain. Elevated lipase.  EXAM: CT ABDOMEN AND PELVIS WITH CONTRAST  TECHNIQUE: Multidetector CT imaging of the abdomen and pelvis was performed using the standard protocol following bolus administration of intravenous contrast.  CONTRAST:  25mL OMNIPAQUE IOHEXOL 300 MG/ML SOLN, OMNIPAQUE IOHEXOL 300 MG/ML SOLN  COMPARISON:  Abdominal ultrasound performed 08/31/2013  FINDINGS: Minimal bibasilar atelectasis is noted.  There is mild gallbladder wall thickening and question of trace pericholecystic fluid. The common hepatic duct is dilated to 1.0 cm in maximum diameter, and there is mild prominence of the intrahepatic biliary ducts. This raises concern for distal obstruction, though no definite mass or stone is characterized on this study.  The liver is otherwise unremarkable in appearance. The spleen is within normal limits. The pancreas is otherwise grossly unremarkable. The adrenal glands are normal in appearance.  The kidneys are unremarkable in appearance. There is no evidence of hydronephrosis. No renal or ureteral stones are seen. No perinephric stranding is appreciated.  No free fluid is identified. The small  bowel is unremarkable in appearance. The stomach is within normal limits. No acute vascular abnormalities are seen.  The appendix is normal in caliber, without evidence for appendicitis. The colon is unremarkable in appearance.  The bladder is moderately distended and grossly unremarkable. The uterus is grossly unremarkable. The ovaries are relatively symmetric. No suspicious adnexal masses are seen. No inguinal lymphadenopathy is seen.  No acute osseous abnormalities are identified. There is mild chronic loss of height at L3.  IMPRESSION: 1. Dilatation of the common hepatic duct to 1.0 cm, with mild prominence of the intrahepatic biliary ducts. This raises concern for distal obstruction, though no definite mass or stone is characterized on this study. MRCP or ERCP would be helpful for further evaluation, when and as deemed clinically appropriate. 2. Mild gallbladder wall thickening and question of trace pericholecystic fluid. This could reflect the obstruction described above, though mild cholecystitis cannot be  entirely excluded.   Electronically Signed   By: Roanna Raider M.D.   On: 10/02/2013 02:26   Mr 3d Recon At Scanner  10/24/2013   CLINICAL DATA:  Cholecystectomy on 08/16. Pain. Acute renal failure. Elevated liver function tests.  EXAM: MRI ABDOMEN WITHOUT AND WITH CONTRAST (INCLUDING MRCP)  TECHNIQUE: Multiplanar multisequence MR imaging of the abdomen was performed both before and after the administration of intravenous contrast. Heavily T2-weighted images of the biliary and pancreatic ducts were obtained, and three-dimensional MRCP images were rendered by post processing.  CONTRAST:  6mL MULTIHANCE GADOBENATE DIMEGLUMINE 529 MG/ML IV SOLN  COMPARISON:  CT 10/22/2013 and 10/02/2013 ultrasound 10/22/2013.  FINDINGS: Portions of the exam, especially the pre and post contrast dynamic series, are motion degraded. The post-contrast dynamic images are essentially nondiagnostic.  Mild cardiomegaly, without  pericardial or pleural effusion.  No suspicious liver lesion. Mild intrahepatic biliary ductal dilatation. Normal spleen, stomach. Pancreatic duct is upper normal, 4 mm on image 43 of series 7.  Interpolar left renal lesion measures 1.0 cm and demonstrates precontrast T1 hyperintensity. T2 hypointense. Suboptimally evaluated after contrast.  The right kidney demonstrates enlargement and heterogeneous T2 signal, most consistent with pyelonephritis.  Status post cholecystectomy, without postoperative fluid collection. The common duct is normal for prior cholecystectomy state. Example 7 mm on image 35/series 7. No evidence of obstructive stone or mass. Normal abdominal bowel loops, without ascites. Anasarca is slightly greater on the right and may be postoperative partially.  IMPRESSION: 1. Mild intrahepatic biliary ductal dilatation without evidence of common duct dilatation or choledocholithiasis. 2. No evidence of postoperative fluid collection after cholecystectomy. 3. Heterogeneous right renal signal, most consistent with pyelonephritis. 4. Moderately degraded exam, especially pertaining to the post-contrast dynamic series. 5. Left renal lesion which is indeterminate but favored to represent a hemorrhagic cyst. Consider ultrasound follow-up at 1 year.   Electronically Signed   By: Jeronimo Greaves M.D.   On: 10/24/2013 12:24   Dg Chest Port 1 View  10/05/2013   CLINICAL DATA:  Infiltrate  EXAM: PORTABLE CHEST - 1 VIEW  COMPARISON:  October 04, 2013  FINDINGS: There is patchy consolidation in the left base. Lungs elsewhere clear. Heart is mildly enlarged with pulmonary vascularity within normal limits. No adenopathy. There is atherosclerotic change in the aorta.  IMPRESSION: Slight increase in patchy left base atelectasis. Right lung clear. Note that the degree of inspiration is shallow.   Electronically Signed   By: Bretta Bang M.D.   On: 10/05/2013 07:29   Dg Chest Port 1 View  10/04/2013   CLINICAL DATA:   New onset fever.  Preop for surgery.  Chest pain.  EXAM: PORTABLE CHEST - 1 VIEW  COMPARISON:  08/31/2013.  FINDINGS: Cardiac silhouette is normal in size. No mediastinal or hilar masses.  Mild interstitial prominence, stable. There is a small focal opacity that projects at the left lateral lung base. This may reflect a focus of atelectasis. Small area of pneumonia is not excluded but felt unlikely. No other lung opacities. No pleural effusion or pneumothorax.  Bony thorax is demineralized but grossly intact.  IMPRESSION: Small area of opacity at the left lateral lung base. This is likely atelectasis. A small area of infiltrate is not excluded. No other evidence of acute cardiopulmonary disease.   Electronically Signed   By: Amie Portland M.D.   On: 10/04/2013 07:53   Mr Abd W/wo Cm/mrcp  10/24/2013   CLINICAL DATA:  Cholecystectomy on 08/16. Pain. Acute renal failure.  Elevated liver function tests.  EXAM: MRI ABDOMEN WITHOUT AND WITH CONTRAST (INCLUDING MRCP)  TECHNIQUE: Multiplanar multisequence MR imaging of the abdomen was performed both before and after the administration of intravenous contrast. Heavily T2-weighted images of the biliary and pancreatic ducts were obtained, and three-dimensional MRCP images were rendered by post processing.  CONTRAST:  6mL MULTIHANCE GADOBENATE DIMEGLUMINE 529 MG/ML IV SOLN  COMPARISON:  CT 10/22/2013 and 10/02/2013 ultrasound 10/22/2013.  FINDINGS: Portions of the exam, especially the pre and post contrast dynamic series, are motion degraded. The post-contrast dynamic images are essentially nondiagnostic.  Mild cardiomegaly, without pericardial or pleural effusion.  No suspicious liver lesion. Mild intrahepatic biliary ductal dilatation. Normal spleen, stomach. Pancreatic duct is upper normal, 4 mm on image 43 of series 7.  Interpolar left renal lesion measures 1.0 cm and demonstrates precontrast T1 hyperintensity. T2 hypointense. Suboptimally evaluated after contrast.  The  right kidney demonstrates enlargement and heterogeneous T2 signal, most consistent with pyelonephritis.  Status post cholecystectomy, without postoperative fluid collection. The common duct is normal for prior cholecystectomy state. Example 7 mm on image 35/series 7. No evidence of obstructive stone or mass. Normal abdominal bowel loops, without ascites. Anasarca is slightly greater on the right and may be postoperative partially.  IMPRESSION: 1. Mild intrahepatic biliary ductal dilatation without evidence of common duct dilatation or choledocholithiasis. 2. No evidence of postoperative fluid collection after cholecystectomy. 3. Heterogeneous right renal signal, most consistent with pyelonephritis. 4. Moderately degraded exam, especially pertaining to the post-contrast dynamic series. 5. Left renal lesion which is indeterminate but favored to represent a hemorrhagic cyst. Consider ultrasound follow-up at 1 year.   Electronically Signed   By: Jeronimo Greaves M.D.   On: 10/24/2013 12:24    Microbiology: Recent Results (from the past 240 hour(s))  URINE CULTURE     Status: None   Collection Time    10/22/13 11:46 AM      Result Value Ref Range Status   Specimen Description URINE, CLEAN CATCH   Final   Special Requests NONE   Final   Culture  Setup Time     Final   Value: 10/22/2013 17:22     Performed at [facility] Count     Final   Value: >=100,000 COLONIES/ML     Performed at [facility]   Culture     Final   Value: ESCHERICHIA COLI     Note: Confirmed Extended Spectrum Beta-Lactamase Producer (ESBL)     Performed at [facility]   Report Status 10/24/2013 FINAL   Final   Organism ID, Bacteria ESCHERICHIA COLI   Final  CULTURE, BLOOD (ROUTINE X 2)     Status: None   Collection Time    10/22/13  2:15 PM      Result Value Ref Range Status   Specimen Description BLOOD HAND LEFT   Final   Special Requests BOTTLES DRAWN AEROBIC AND ANAEROBIC 10CC   Final    Culture  Setup Time     Final   Value: 10/22/2013 19:56     Performed at [facility]   Culture     Final   Value: ESCHERICHIA COLI     Note: Confirmed Extended Spectrum Beta-Lactamase Producer (ESBL) CRITICAL RESULT CALLED TO, READ BACK BY AND VERIFIED WITH: BRANDON FOUST 10/25/13 @ 9:01AM BY RUSCOE A.     Note: Gram Stain Report Called to,Read Back By and Verified With: LANISHA HUNTER @ 1456 ON [phone] BY  [facility]     Performed at [facility]   Report Status 10/25/2013 FINAL   Final   Organism ID, Bacteria ESCHERICHIA COLI   Final  CULTURE, BLOOD (ROUTINE X 2)     Status: None   Collection Time    10/22/13  2:30 PM      Result Value Ref Range Status   Specimen Description BLOOD ARM RIGHT   Final   Special Requests BOTTLES DRAWN AEROBIC AND ANAEROBIC 10CC   Final   Culture  Setup Time     Final   Value: 10/22/2013 19:53     Performed at [facility]   Culture     Final   Value: ESCHERICHIA COLI     Note: Confirmed Extended Spectrum Beta-Lactamase Producer (ESBL) CRITICAL RESULT CALLED TO, READ BACK BY AND VERIFIED WITH: BRANDON FOUST 10/25/13 @ 9:01AM BY RUSCOE A.     Note: Culture results may be compromised due to an excessive volume of blood received in culture bottles. Gram Stain Report Called to,Read Back By and Verified With: Leodis Binet @ [phone] ON [phone] BY [facility]     Performed at [facility]   Report Status 10/25/2013 FINAL   Final  CULTURE, BLOOD (ROUTINE X 2)     Status: None   Collection Time    10/25/13 12:20 PM      Result Value Ref Range Status   Specimen Description BLOOD RIGHT ARM   Final   Special Requests BOTTLES DRAWN AEROBIC AND ANAEROBIC 5 CC   Final   Culture  Setup Time     Final   Value: 10/25/2013 19:17     Performed at [facility]   Culture     Final   Value:        BLOOD CULTURE RECEIVED NO GROWTH TO DATE CULTURE WILL BE HELD FOR 5 DAYS BEFORE ISSUING A FINAL NEGATIVE REPORT     Performed at [facility]   Report  Status PENDING   Incomplete  CULTURE, BLOOD (ROUTINE X 2)     Status: None   Collection Time    10/25/13 12:25 PM      Result Value Ref Range Status   Specimen Description BLOOD RIGHT ARM   Final   Special Requests BOTTLES DRAWN AEROBIC AND ANAEROBIC 5 CC   Final   Culture  Setup Time     Final   Value: 10/25/2013 19:17     Performed at [facility]   Culture     Final   Value:        BLOOD CULTURE RECEIVED NO GROWTH TO DATE CULTURE WILL BE HELD FOR 5 DAYS BEFORE ISSUING A FINAL NEGATIVE REPORT     Performed at [facility]   Report Status PENDING   Incomplete     Labs: Basic Metabolic Panel:  Recent Labs Lab 10/22/13 0922 10/23/13 0345 10/24/13 0402 10/25/13 0344  NA 132* 132* 138 135*  K 3.2* 3.8 3.7 3.4*  CL 96 99 105 103  CO2 20 19 21 20   GLUCOSE 143* 90 124* 87  BUN 20 20 19 14   CREATININE 1.65* 1.56* 1.44* 1.04  CALCIUM 8.3* 8.4 8.1* 8.0*   Liver Function Tests:  Recent Labs Lab 10/22/13 0922 10/23/13 0345 10/25/13 0344  AST 43* 38* 43*  ALT 39* 37* 34  ALKPHOS 121* 124* 102  BILITOT 1.7* 1.2 0.7  PROT 7.1 6.9 6.3  ALBUMIN 2.9* 2.6* 2.2*    Recent Labs  Lab 10/22/13 0922  LIPASE 13   No results found for this basename: AMMONIA,  in the last 168 hours CBC:  Recent Labs Lab 10/22/13 0922 10/23/13 0345 10/24/13 0402 10/25/13 0344 10/26/13 0341  WBC 12.7* 10.9* 6.6 6.5 6.4  NEUTROABS 10.8*  --   --   --   --   HGB 9.8* 9.8* 8.8* 8.5* 8.3*  HCT 28.8* 28.9* 26.1* 25.5* 24.4*  MCV 74.0* 74.7* 76.1* 75.9* 73.3*  PLT 179 162 140* 147* 161   Cardiac Enzymes: No results found for this basename: CKTOTAL, CKMB, CKMBINDEX, TROPONINI,  in the last 168 hours BNP: BNP (last 3 results) No results found for this basename: PROBNP,  in the last 8760 hours CBG: No results found for this basename: GLUCAP,  in the last 168 hours     Signed:  Esperanza Sheets  [facility] 10/27/2013, 10:44 AM

## 2013-10-27 NOTE — ED Provider Notes (Signed)
I saw and evaluated the patient, reviewed the resident's note and I agree with the findings and plan.  Pt with recent lap chol. Presents for recheck. Mild upper abd discomfort. No nv. Afeb. Abd soft nt.    Suzi Roots, MD 10/27/13 [phone]

## 2013-10-27 NOTE — Discharge Instructions (Signed)
Please follow up with primary care doctor in 1 week  °

## 2013-10-27 NOTE — Progress Notes (Signed)
Peripherally Inserted Central Catheter/Midline Placement  The IV Nurse has discussed with the patient and/or persons authorized to consent for the patient, the purpose of this procedure and the potential benefits and risks involved with this procedure.  The benefits include less needle sticks, lab draws from the catheter and patient may be discharged home with the catheter.  Risks include, but not limited to, infection, bleeding, blood clot (thrombus formation), and puncture of an artery; nerve damage and irregular heat beat.  Alternatives to this procedure were also discussed.  PICC/Midline Placement Documentation    Used interpreter services to explain procedure to son and patient    Michelle Boyd 10/27/2013, 9:37 AM

## 2013-10-31 LAB — CULTURE, BLOOD (ROUTINE X 2)
CULTURE: NO GROWTH
Culture: NO GROWTH

## 2013-11-02 ENCOUNTER — Encounter: Payer: Self-pay | Admitting: Internal Medicine

## 2013-11-02 ENCOUNTER — Ambulatory Visit: Payer: Medicaid Other | Attending: Internal Medicine | Admitting: Internal Medicine

## 2013-11-02 VITALS — BP 115/76 | HR 78 | Temp 97.9°F | Resp 14 | Ht 59.0 in | Wt 129.0 lb

## 2013-11-02 DIAGNOSIS — R7309 Other abnormal glucose: Secondary | ICD-10-CM | POA: Insufficient documentation

## 2013-11-02 DIAGNOSIS — Z23 Encounter for immunization: Secondary | ICD-10-CM

## 2013-11-02 DIAGNOSIS — K219 Gastro-esophageal reflux disease without esophagitis: Secondary | ICD-10-CM | POA: Insufficient documentation

## 2013-11-02 DIAGNOSIS — I10 Essential (primary) hypertension: Secondary | ICD-10-CM | POA: Diagnosis not present

## 2013-11-02 DIAGNOSIS — N12 Tubulo-interstitial nephritis, not specified as acute or chronic: Secondary | ICD-10-CM | POA: Diagnosis not present

## 2013-11-02 DIAGNOSIS — R7303 Prediabetes: Secondary | ICD-10-CM

## 2013-11-02 NOTE — Progress Notes (Signed)
Patient ID: Michelle Boyd, female   DOB: 05/21/1950, 63 y.o.   MRN: [id number]  CC: hospital follow up  HPI:  Patient presents to clinic today as a hospital follow up for pyelonephritis.  She was admitted into the hospital for IV antibiotic treatment.  While in-patient she suffered acute kidney injury and was taken off lasix and lisinopril, and changed to hydralazine.  She misunderstood and has been continuing to take lisinopril.  She has been doing very well with the medication changes.  She reports that she has had minimal abdominal pain but she feels improvement.  She presents today with a left upper arm single lumen picc line for continued IV antibiotics.  She has a nurse that comes weekly to maintain her PICC line site.  Her son reports that she has been requiring more assistance with ADL's for the past year and it is becoming more difficult for him to assist her.   No Known Allergies Past Medical History  Diagnosis Date  . Hypertension   . Chronic knee pain   . GERD (gastroesophageal reflux disease)   . Gallstone pancreatitis   . Cholelithiasis    Current Outpatient Prescriptions on File Prior to Visit  Medication Sig Dispense Refill  . ertapenem 1 g in sodium chloride 0.9 % 50 mL Inject 1 g into the vein daily.  1 packet  0  . ferrous sulfate 325 (65 FE) MG tablet Take 1 tablet (325 mg total) by mouth 3 (three) times daily with meals.  90 tablet  3  . acetaminophen (TYLENOL) 325 MG tablet Take 650 mg by mouth every 6 (six) hours as needed for mild pain or fever.      . famotidine (PEPCID) 20 MG tablet Take 20 mg by mouth 2 (two) times daily.      . hydrALAZINE (APRESOLINE) 25 MG tablet Take 1 tablet (25 mg total) by mouth 3 (three) times daily.  90 tablet  0  . [DISCONTINUED] chlorthalidone (HYGROTON) 25 MG tablet Take 25 mg by mouth daily.      . [DISCONTINUED] lisinopril (PRINIVIL,ZESTRIL) 10 MG tablet Take 10 mg by mouth daily.       No current facility-administered medications on file  prior to visit.   History reviewed. No pertinent family history. History   Social History  . Marital Status: Married    Spouse Name: N/A    Number of Children: N/A  . Years of Education: N/A   Occupational History  . Not on file.   Social History Main Topics  . Smoking status: Never Smoker   . Smokeless tobacco: Not on file  . Alcohol Use: No  . Drug Use: No  . Sexual Activity: No   Other Topics Concern  . Not on file   Social History Narrative  . No narrative on file   Review of Systems  Constitutional: Negative for fever and chills.  Respiratory: Negative.   Cardiovascular: Negative for chest pain, palpitations and leg swelling.  Genitourinary: Positive for dysuria (some burning still). Negative for urgency, frequency and hematuria.  Neurological: Negative for dizziness and headaches.      Objective:   Filed Vitals:   11/02/13 1011  BP: 115/76  Pulse: 78  Temp: 97.9 F (36.6 C)  Resp: 14    Physical Exam  Eyes: Conjunctivae and EOM are normal. Pupils are equal, round, and reactive to light.  Cardiovascular: Normal rate, regular rhythm and normal heart sounds.   Pulmonary/Chest: Effort normal and breath sounds normal.  Abdominal: Soft. Bowel sounds are normal. She exhibits no distension. There is tenderness.  Neurological: She is alert.  Skin: No bruising noted.        Lab Results  Component Value Date   WBC 6.4 10/26/2013   HGB 8.3* 10/26/2013   HCT 24.4* 10/26/2013   MCV 73.3* 10/26/2013   PLT 161 10/26/2013   Lab Results  Component Value Date   CREATININE 1.04 10/25/2013   BUN 14 10/25/2013   NA 135* 10/25/2013   K 3.4* 10/25/2013   CL 103 10/25/2013   CO2 20 10/25/2013    Lab Results  Component Value Date   HGBA1C 6.4* 04/04/2012   Lipid Panel     Component Value Date/Time   CHOL 169 01/07/2013 1231   TRIG 199* 01/07/2013 1231   HDL 38* 01/07/2013 1231   CHOLHDL 4.4 01/07/2013 1231   VLDL 40 01/07/2013 1231   LDLCALC 91 01/07/2013 1231         Assessment and plan:   Michelle Boyd was seen today for follow-up.  Diagnoses and associated orders for this visit:  Pyelonephritis She reportedly has 10 more days of IV antibiotics.  Will recheck urine prior to removal. Essential hypertension Discontinue lisinopril and begin taking hydralazine only Need for prophylactic vaccination and inoculation against influenza Received Prediabetes  Will need to recheck hemoglobin a1c on next visit  Return in about 2 weeks (around 11/16/2013) for Nurse Visit-urine check and culture, 3 mo PCP.   Holland Commons, NP-C [facility] [phone] 11/16/2013, 5:21 PM

## 2013-11-02 NOTE — Progress Notes (Signed)
Pt was in the ED recently with Pyelonephritis and AKI. Pt states that she is still having pain in her abdomen but reports feeling better today.

## 2013-11-03 ENCOUNTER — Telehealth: Payer: Self-pay | Admitting: Internal Medicine

## 2013-11-03 NOTE — Telephone Encounter (Signed)
Nurse from [facility] is calling to request an order to be able to pull the picc line. Pt is currently on IV antibiotics and nurse needs an order to be able to pull the picc line when the antibiotics are finished per doctors orders. Please f/u with nurse.

## 2013-11-05 ENCOUNTER — Telehealth: Payer: Self-pay | Admitting: Emergency Medicine

## 2013-11-05 NOTE — Telephone Encounter (Signed)
Did you recheck her urine before giving the order to discontinue picc line?

## 2013-11-05 NOTE — Telephone Encounter (Signed)
Home care nurse,Amy Angraves called in requesting verbal order to discontinue Left arm single line Picc . Pt will receive last dose of IVATB today. Verbal orders given after speaking with Vikki Ports

## 2013-11-05 NOTE — Telephone Encounter (Signed)
Amy from [facility]. Call was transferred to nurse lead

## 2013-11-05 NOTE — Telephone Encounter (Signed)
Clarification per Vikki Ports: [facility] nurse Amy will check urine culture before d/c Picc line.

## 2013-11-06 NOTE — Telephone Encounter (Signed)
[facility] nurse Amy calling to speak to nurse in regard to pt's picc line. Amy states that she is at pt's home and pt is complaining of pain from arm, around picc site. Amy states that there is drainage leakage from site. Amy states that she will still get the urine culture but thinks the picc line needs to be removed. Please contact Amy. [phone].

## 2013-11-09 NOTE — Telephone Encounter (Signed)
Spoke with Purcell Nails, RN with [facility]. States midline catheter was removed due to bloody drainage on order from provider on 11/06/13 States patient denies s/sx of UTI. States BUN/creatinine and CBC with Diff were drawn on 11/02/13 and faxed to [facility] on 11/03/13. States results from UA and C&S from 11/06/13 are not back yet but will contact us when those results are in.

## 2013-11-09 NOTE — Telephone Encounter (Signed)
Can you please follow up and see if results have bee received on this patient. Thanks

## 2013-11-10 NOTE — Telephone Encounter (Signed)
Followed up with [facility] nurse Amy. States the pt is doing well on assessment. States she faxed urine BUN/Creat report over, and awaiting UA C&S from 11/06/2013.

## 2013-11-12 ENCOUNTER — Telehealth: Payer: Self-pay | Admitting: Internal Medicine

## 2013-11-12 NOTE — Telephone Encounter (Signed)
Nurse called from [facility] to check on orders faxed to pt.'s Dr. Please f/u with nurse.

## 2013-11-13 ENCOUNTER — Telehealth: Payer: Self-pay | Admitting: Internal Medicine

## 2013-11-13 NOTE — Telephone Encounter (Signed)
Left message on VM to f/u with paperwork faxed over from [facility]

## 2013-11-13 NOTE — Care Management Note (Signed)
Ms Michelle Boyd was determined to be at high risk for readmission with her last IP encounter. Our goal with assigning her to [facility] at [place] was to have them in the home for 30 days (approximately October 8th). Please call me if you have questions about this initiative- [phone].

## 2013-11-13 NOTE — Telephone Encounter (Signed)
Patient's case manager, Amy, calling from [facility] to confirm if doctor/nurse has received a copy of results to a few test that she ran ( uranalysis & culture. Bloodwork  cbc w/ diff) If results has not been received, Amy would like to discuss this with nurse/doctor. Amy also would like to confirm if services were still  Needed. Please follow up with Amy.

## 2013-11-16 NOTE — Telephone Encounter (Signed)
Left message with nurse Amy to re fax results to clinic

## 2013-11-18 ENCOUNTER — Ambulatory Visit: Payer: Medicaid Other | Attending: Internal Medicine

## 2013-11-18 NOTE — Progress Notes (Unsigned)
Patient ID: Michelle Boyd, female   DOB: 01/15/1951, 63 y.o.   MRN: [id number] Pt comes in for blood pressure recheck for Hypertension Pt is compliant with taking prescribed medications daily BP-149/77 88 denies chest pain or dizziness Nepali interpretor present

## 2013-11-30 ENCOUNTER — Encounter: Payer: Self-pay | Admitting: Internal Medicine

## 2013-12-07 ENCOUNTER — Telehealth: Payer: Self-pay | Admitting: Internal Medicine

## 2013-12-07 NOTE — Telephone Encounter (Signed)
Michelle MediateKedar Boyd on behalf of his Mother is requesting refill to control high blood pressure. Patient's son doesn't remember the name of the prescription. Please follow up with Patient's son at [phone]

## 2014-02-15 ENCOUNTER — Encounter: Payer: Self-pay | Admitting: Internal Medicine

## 2014-02-15 ENCOUNTER — Ambulatory Visit: Payer: Medicaid Other | Attending: Internal Medicine | Admitting: Internal Medicine

## 2014-02-15 VITALS — BP 143/98 | HR 75 | Temp 98.7°F | Resp 16 | Ht 59.0 in | Wt 133.0 lb

## 2014-02-15 DIAGNOSIS — K219 Gastro-esophageal reflux disease without esophagitis: Secondary | ICD-10-CM | POA: Insufficient documentation

## 2014-02-15 DIAGNOSIS — D649 Anemia, unspecified: Secondary | ICD-10-CM

## 2014-02-15 DIAGNOSIS — M79604 Pain in right leg: Secondary | ICD-10-CM | POA: Insufficient documentation

## 2014-02-15 DIAGNOSIS — Z9114 Patient's other noncompliance with medication regimen: Secondary | ICD-10-CM | POA: Insufficient documentation

## 2014-02-15 DIAGNOSIS — R51 Headache: Secondary | ICD-10-CM | POA: Diagnosis not present

## 2014-02-15 DIAGNOSIS — I10 Essential (primary) hypertension: Secondary | ICD-10-CM

## 2014-02-15 DIAGNOSIS — Z79899 Other long term (current) drug therapy: Secondary | ICD-10-CM | POA: Insufficient documentation

## 2014-02-15 DIAGNOSIS — H538 Other visual disturbances: Secondary | ICD-10-CM

## 2014-02-15 DIAGNOSIS — G8929 Other chronic pain: Secondary | ICD-10-CM | POA: Diagnosis not present

## 2014-02-15 LAB — CBC
HCT: 35.2 % — ABNORMAL LOW (ref 36.0–46.0)
HEMOGLOBIN: 12 g/dL (ref 12.0–15.0)
MCH: 26.5 pg (ref 26.0–34.0)
MCHC: 34.1 g/dL (ref 30.0–36.0)
MCV: 77.7 fL — ABNORMAL LOW (ref 78.0–100.0)
MPV: 10.2 fL (ref 9.4–12.4)
Platelets: 203 10*3/uL (ref 150–400)
RBC: 4.53 MIL/uL (ref 3.87–5.11)
RDW: 13.9 % (ref 11.5–15.5)
WBC: 6.2 10*3/uL (ref 4.0–10.5)

## 2014-02-15 MED ORDER — CLONIDINE HCL 0.1 MG PO TABS
0.1000 mg | ORAL_TABLET | Freq: Once | ORAL | Status: AC
Start: 2014-02-15 — End: 2014-02-15
  Administered 2014-02-15: 0.1 mg via ORAL

## 2014-02-15 MED ORDER — HYDRALAZINE HCL 25 MG PO TABS
25.0000 mg | ORAL_TABLET | Freq: Three times a day (TID) | ORAL | Status: DC
Start: 2014-02-15 — End: 2014-05-27

## 2014-02-15 MED ORDER — FERROUS SULFATE 325 (65 FE) MG PO TABS: 325.0000 mg | ORAL_TABLET | Freq: Three times a day (TID) | ORAL | Status: DC

## 2014-02-15 NOTE — Progress Notes (Signed)
Pt is here today stating that at night time she gets pain in her right leg. Pt has been out of all her medications for a month.

## 2014-02-15 NOTE — Progress Notes (Signed)
Patient ID: Michelle Boyd, female   DOB: 11/10/1950, 63 y.o.   MRN: [id number]  CC: right leg pain  HPI: Michelle Boyd is a 63 y.o. female here today for a follow up visit.  Patient has past medical history of hypertension, GERD, pancreatitis, and chronic knee pain.  Patient reports that she has been out of all her medications for the past one month.  She states that her son told her that she no longer needed to take medications. Today the patient c/o of pain in her right leg at night time that affects her sleep.  She states that she has headaches almost every night.  The headaches begin in her temporal region that is described as a bad pain.  She is unable to describe the headache but reports blurred vision in the evening with headaches.  Denies nausea, vomiting, phonophobia.     No Known Allergies Past Medical History  Diagnosis Date  . Hypertension   . Chronic knee pain   . GERD (gastroesophageal reflux disease)   . Gallstone pancreatitis   . Cholelithiasis    Current Outpatient Prescriptions on File Prior to Visit  Medication Sig Dispense Refill  . acetaminophen (TYLENOL) 325 MG tablet Take 650 mg by mouth every 6 (six) hours as needed for mild pain or fever.    . famotidine (PEPCID) 20 MG tablet Take 20 mg by mouth 2 (two) times daily.    . ferrous sulfate 325 (65 FE) MG tablet Take 1 tablet (325 mg total) by mouth 3 (three) times daily with meals. (Patient not taking: Reported on 02/15/2014) 90 tablet 3  . hydrALAZINE (APRESOLINE) 25 MG tablet Take 1 tablet (25 mg total) by mouth 3 (three) times daily. (Patient not taking: Reported on 02/15/2014) 90 tablet 0  . lisinopril-hydrochlorothiazide (PRINZIDE,ZESTORETIC) 20-25 MG per tablet Take 1 tablet by mouth daily.    . [DISCONTINUED] chlorthalidone (HYGROTON) 25 MG tablet Take 25 mg by mouth daily.    . [DISCONTINUED] lisinopril (PRINIVIL,ZESTRIL) 10 MG tablet Take 10 mg by mouth daily.     No current facility-administered medications on file  prior to visit.   History reviewed. No pertinent family history. History   Social History  . Marital Status: Married    Spouse Name: N/A    Number of Children: N/A  . Years of Education: N/A   Occupational History  . Not on file.   Social History Main Topics  . Smoking status: Never Smoker   . Smokeless tobacco: Not on file  . Alcohol Use: No  . Drug Use: No  . Sexual Activity: No   Other Topics Concern  . Not on file   Social History Narrative    Review of Systems  Constitutional: Positive for malaise/fatigue.  Eyes: Positive for blurred vision.  Respiratory: Negative.   Cardiovascular: Negative.   Gastrointestinal: Negative for nausea and vomiting.  Musculoskeletal: Positive for joint pain.  Neurological: Positive for tingling (right leg), weakness and headaches.       Objective:   Filed Vitals:   02/15/14 0955  BP: 195/110  Pulse: 75  Temp: 98.7 F (37.1 C)  Resp: 16    Physical Exam  Constitutional: She is oriented to person, place, and time.  Cardiovascular: Normal rate, regular rhythm and normal heart sounds.   Pulmonary/Chest: Effort normal and breath sounds normal.  Abdominal: Soft.  Musculoskeletal: She exhibits no edema or tenderness.  Neurological: She is alert and oriented to person, place, and time. No cranial nerve deficit.  '  Lab Results  Component Value Date   WBC 6.4 10/26/2013   HGB 8.3* 10/26/2013   HCT 24.4* 10/26/2013   MCV 73.3* 10/26/2013   PLT 161 10/26/2013   Lab Results  Component Value Date   CREATININE 1.04 10/25/2013   BUN 14 10/25/2013   NA 135* 10/25/2013   K 3.4* 10/25/2013   CL 103 10/25/2013   CO2 20 10/25/2013    Lab Results  Component Value Date   HGBA1C 6.4* 04/04/2012   Lipid Panel     Component Value Date/Time   CHOL 169 01/07/2013 1231   TRIG 199* 01/07/2013 1231   HDL 38* 01/07/2013 1231   CHOLHDL 4.4 01/07/2013 1231   VLDL 40 01/07/2013 1231   LDLCALC 91 01/07/2013 1231        Assessment and plan:   [name] was seen today for follow-up.  Diagnoses and associated orders for this visit:  Essential hypertension - cloNIDine (CATAPRES) tablet 0.1 mg; Take 1 tablet (0.1 mg total) by mouth once. - hydrALAZINE (APRESOLINE) 25 MG tablet; Take 1 tablet (25 mg total) by mouth 3 (three) times daily. Patient is non compliant with medication.  Explained the importance of medication to decrease risk of strokes and heart attacks  Anemia, unspecified anemia type - CBC - ferrous sulfate 325 (65 FE) MG tablet; Take 1 tablet (325 mg total) by mouth 3 (three) times daily with meals.  Blurred vision - Ambulatory referral to Ophthalmology   Due to language barrier, an interpreter was present during the history-taking and subsequent discussion (and for part of the physical exam) with this patient.  Return in about 2 weeks for Nurse Visit-BP check and 3 mo PCP HTN.        Holland CommonsKECK, Bartow Zylstra, NP-C [facility] [phone] 02/15/2014, 10:43 AM

## 2014-02-19 HISTORY — PX: CATARACT EXTRACTION: SUR2

## 2014-02-24 ENCOUNTER — Telehealth: Payer: Self-pay | Admitting: *Deleted

## 2014-02-24 NOTE — Telephone Encounter (Signed)
Left voice message to return call 

## 2014-02-24 NOTE — Telephone Encounter (Signed)
-----   Message from Ambrose FinlandValerie A Keck, NP sent at 02/18/2014  1:43 PM EST ----- Anemia greatly improved. May decrease iron to daily and we will recheck on next visit

## 2014-04-19 ENCOUNTER — Ambulatory Visit: Payer: Medicaid Other | Attending: Internal Medicine | Admitting: *Deleted

## 2014-04-19 VITALS — BP 174/88 | HR 81 | Temp 97.9°F | Resp 16 | Wt 141.4 lb

## 2014-04-19 DIAGNOSIS — D649 Anemia, unspecified: Secondary | ICD-10-CM

## 2014-04-19 DIAGNOSIS — I10 Essential (primary) hypertension: Secondary | ICD-10-CM | POA: Diagnosis not present

## 2014-04-19 MED ORDER — HYDROCHLOROTHIAZIDE 12.5 MG PO TABS: 12.5000 mg | ORAL_TABLET | Freq: Every day | ORAL | Status: DC

## 2014-04-19 NOTE — Progress Notes (Signed)
Spoke with patient via Sport and exercise psychologistLanguage Resources Interpreter, Tek Patient presents with son for BP check Med list reviewed; states taking hydralazine as directed Discussed need for low sodium diet and using Mrs. Dash as alternative to salt Eats only fresh foods Walking 10-15 minutes per day for exercise Patient denies headaches, chest pain or pressure Positive for blurred vision; does not wear corrective lenses, and SHOB 3-4 times per week with walking and sometimes while sitting for last 2-3 months C/o generalized itching with feosol  BP  174/88 left arm manually with adult cuff P 81 R 16  T  97.9 oral SPO2  97%  Wt 141.4 lb  Per PCP: Add HCTZ 12.5 mg daily Return in 2 weeks for nurse visit for BP check  Patient advised to call for med refills at least 7 days before running out so as not to go without. Patient aware that she is to f/u with PCP 3 months from last visit (Due 05/17/14)

## 2014-05-06 ENCOUNTER — Ambulatory Visit: Payer: Medicaid Other | Attending: Internal Medicine | Admitting: *Deleted

## 2014-05-06 VITALS — BP 160/74 | HR 66 | Temp 97.9°F | Resp 16

## 2014-05-06 DIAGNOSIS — I10 Essential (primary) hypertension: Secondary | ICD-10-CM

## 2014-05-06 NOTE — Progress Notes (Signed)
Spoke with patient via [employer] Interpreter, [place], [place] [phone] and Language Resources, Interpreter, Bishnu Paudal Patient presents with son for BP check after starting HCTZ, however, patient only took HCTZ for 3 days then stopped due to excessive sleepiness Med list reviewed; states taking hydralazine usually only once daily due to forgetting Discussed importance of taking 3 times daily as directed to keep blood pressure controlled States following low sodium diet  Eating only fresh foods Walking 10 minutes per day for exercise. States walking longer is difficult due to old fx left fibula Patient c/o intermittent headaches, blurred vision, mild chest pain. Denies these symptoms at present. [facility] had referral to opthalmology but did not think she needed to go Discussed headaches and blurred vision could be from elevated BP and importance of taking hydralazine tid as directed. Also discussed headaches and blurred vision could be from needing corrective lenses. Appt made for pt with Dr. Marchelle Gearinghris Groat (Opthamologist) for 05/31/14 at 0800  BP 160/74  left arm manually with adult cuff P 66 R 16   T  97.9 oral SPO2 97 %  Patient advised to call for med refills at least 7 days before running out so as not to go without.  Patient aware that she is to f/u with PCP 3 months from last visit (Due 05/17/14)

## 2014-05-17 ENCOUNTER — Ambulatory Visit: Payer: Medicaid Other | Admitting: Internal Medicine

## 2014-05-27 ENCOUNTER — Ambulatory Visit: Payer: Medicaid Other | Attending: Internal Medicine | Admitting: Internal Medicine

## 2014-05-27 ENCOUNTER — Encounter: Payer: Self-pay | Admitting: Internal Medicine

## 2014-05-27 VITALS — BP 170/91 | HR 75 | Temp 97.5°F | Resp 17 | Ht 59.0 in | Wt 150.0 lb

## 2014-05-27 DIAGNOSIS — M25561 Pain in right knee: Secondary | ICD-10-CM | POA: Diagnosis not present

## 2014-05-27 DIAGNOSIS — I10 Essential (primary) hypertension: Secondary | ICD-10-CM | POA: Diagnosis not present

## 2014-05-27 DIAGNOSIS — J302 Other seasonal allergic rhinitis: Secondary | ICD-10-CM | POA: Insufficient documentation

## 2014-05-27 DIAGNOSIS — M25562 Pain in left knee: Secondary | ICD-10-CM | POA: Insufficient documentation

## 2014-05-27 DIAGNOSIS — G8929 Other chronic pain: Secondary | ICD-10-CM

## 2014-05-27 MED ORDER — HYDRALAZINE HCL 25 MG PO TABS: 25.0000 mg | ORAL_TABLET | Freq: Three times a day (TID) | ORAL | Status: DC

## 2014-05-27 MED ORDER — LORATADINE 10 MG PO TABS: 10.0000 mg | ORAL_TABLET | Freq: Every day | ORAL | Status: DC

## 2014-05-27 NOTE — Progress Notes (Signed)
Pt is here following up on her GERD and HTN. Pt reports having chronic widespread pain w/ itching. Mostly in her knees. Interpreter Clovis Rileyaran

## 2014-05-27 NOTE — Progress Notes (Signed)
Patient ID: Michelle Boyd, female   DOB: 09/17/1950, 64 y.o.   MRN: [id number]  CC: itching, generalized pain, HTN   HPI: Michelle Boyd is a 64 y.o. female here today for a follow up visit.  Patient has past medical history of HTN and chronic knee pain. She presents today with concerns of chronic bilateral knee pain that she has had for several months. She reports that she has been taking one tylenol 325 mg tablet once or twice per day with little relief. She states that if she does not take the tylenol then she is unable to function. Patient is interested in receiving steroid joint injections again if possible because that provider her with the most relief.  Patient admits to not taking BP medication daily. She reports that she takes HCTZ once every 2-3 days because it makes her dizzy and sleepy. She states that she also forgets to take the third dose of her hydralazine very often.   Patient has No headache, No chest pain, No abdominal pain - No Nausea, No new weakness tingling or numbness, No Cough - SOB.  Allergies  Allergen Reactions  . Feosol [Iron] Itching   Past Medical History  Diagnosis Date  . Hypertension   . Chronic knee pain   . GERD (gastroesophageal reflux disease)   . Gallstone pancreatitis   . Cholelithiasis    Current Outpatient Prescriptions on File Prior to Visit  Medication Sig Dispense Refill  . acetaminophen (TYLENOL) 325 MG tablet Take 650 mg by mouth every 6 (six) hours as needed for mild pain or fever.    . hydrALAZINE (APRESOLINE) 25 MG tablet Take 1 tablet (25 mg total) by mouth 3 (three) times daily. 90 tablet 3  . hydrochlorothiazide (HYDRODIURIL) 12.5 MG tablet Take 1 tablet (12.5 mg total) by mouth daily. 30 tablet 2  . famotidine (PEPCID) 20 MG tablet Take 20 mg by mouth 2 (two) times daily.    . [DISCONTINUED] chlorthalidone (HYGROTON) 25 MG tablet Take 25 mg by mouth daily.    . [DISCONTINUED] lisinopril (PRINIVIL,ZESTRIL) 10 MG tablet Take 10 mg by mouth daily.      No current facility-administered medications on file prior to visit.   Family History  Problem Relation Age of Onset  . Diabetes Father    History   Social History  . Marital Status: Married    Spouse Name: N/A  . Number of Children: N/A  . Years of Education: N/A   Occupational History  . Not on file.   Social History Main Topics  . Smoking status: Never Smoker   . Smokeless tobacco: Not on file  . Alcohol Use: No  . Drug Use: No  . Sexual Activity: No   Other Topics Concern  . Not on file   Social History Narrative    Review of Systems: See HPI    Objective:   Filed Vitals:   05/27/14 1225  BP: 170/91  Pulse: 75  Temp: 97.5 F (36.4 C)  Resp: 17    Physical Exam: Constitutional: Patient appears well-developed and well-nourished. No distress. Neck: Normal ROM. No JVD. CVS: RRR, S1/S2 +, no murmurs, no gallops, no carotid bruit.  Pulmonary: Effort and breath sounds normal, no stridor, rhonchi, wheezes, rales. .  Musculoskeletal: Pain of right knee with active range of motion. No edema and no tenderness.  Neuro: Alert. Normal reflexes. Skin: Skin is warm and dry. No rash noted. Not diaphoretic. No erythema. No pallor. Psychiatric: Normal mood and affect.  Lab  Results  Component Value Date   WBC 6.2 02/15/2014   HGB 12.0 02/15/2014   HCT 35.2* 02/15/2014   MCV 77.7* 02/15/2014   PLT 203 02/15/2014   Lab Results  Component Value Date   CREATININE 1.04 10/25/2013   BUN 14 10/25/2013   NA 135* 10/25/2013   K 3.4* 10/25/2013   CL 103 10/25/2013   CO2 20 10/25/2013    Lab Results  Component Value Date   HGBA1C 6.4* 04/04/2012   Lipid Panel     Component Value Date/Time   CHOL 169 01/07/2013 1231   TRIG 199* 01/07/2013 1231   HDL 38* 01/07/2013 1231   CHOLHDL 4.4 01/07/2013 1231   VLDL 40 01/07/2013 1231   LDLCALC 91 01/07/2013 1231       Assessment and plan:   Michelle Boyd was seen today for follow-up.  Diagnoses and all orders for  this visit:  Essential hypertension Orders: -     hydrALAZINE (APRESOLINE) 25 MG tablet; Take 1 tablet (25 mg total) by mouth 3 (three) times daily. Patient is non-compliant with medication regimen, I have went over consequences of long standing untreated hypertension. Patient verbalizes understanding through interpreter.  Bilateral chronic knee pain Orders: -     Ambulatory referral to Orthopedic Surgery Will refer for repeat joint injections  Seasonal allergies Orders: -    Begin loratadine (CLARITIN) 10 MG tablet; Take 1 tablet (10 mg total) by mouth daily.  Due to language barrier, an interpreter was present during the history-taking and subsequent discussion (and for part of the physical exam) with this patient.  Return in about 3 months (around 08/26/2014) for Hypertension.       Holland Commons, NP-C [facility] [phone] 05/27/2014, 12:38 PM

## 2014-06-25 ENCOUNTER — Ambulatory Visit (INDEPENDENT_AMBULATORY_CARE_PROVIDER_SITE_OTHER): Payer: Medicaid Other | Admitting: Sports Medicine

## 2014-06-25 ENCOUNTER — Encounter: Payer: Self-pay | Admitting: Sports Medicine

## 2014-06-25 ENCOUNTER — Ambulatory Visit
Admission: RE | Admit: 2014-06-25 | Discharge: 2014-06-25 | Disposition: A | Discharge status: Home / Self Care | Payer: Medicaid Other | Source: Ambulatory Visit | Attending: Sports Medicine | Admitting: Sports Medicine

## 2014-06-25 VITALS — BP 161/74 | Ht 62.0 in | Wt 112.4 lb

## 2014-06-25 DIAGNOSIS — M25561 Pain in right knee: Secondary | ICD-10-CM

## 2014-06-25 MED ORDER — METHYLPREDNISOLONE ACETATE 40 MG/ML IJ SUSP
40.0000 mg | Freq: Once | INTRAMUSCULAR | Status: AC
  Administered 2014-06-25: 40 mg via INTRA_ARTICULAR

## 2014-06-25 NOTE — Progress Notes (Signed)
  Michelle Boyd - 64 y.o. female MRN [id number]  Date of birth: 01/29/1951  SUBJECTIVE:  Including CC & ROS.  Patient is a pleasant 64 year old female from [place] who presents today for right knee pain. Patient was seen by her PCP this past month complaining of intermittent right knee pain that acutely flared up over the past month. She was seen in our office in July 2015 with similar knee pain. At that point in time her knee pain was suspected to be secondary to arthritis and she was treated with an intra-articular injection. She reports clinically responding well to this injection and found great pain relief for the past year. She is interested in repeating injection today. She did not obtain x-rays last year of her knees as they were ordered. She denies any recent trauma to her knee but does report a significant trauma at the age of 6 the cause and anterior cruciate ligament tear. More recently patient's been treating her pain with over-the-counter anti-inflammatory. She describes the pain as throbbing but denies any catching or locking. She does report some occasional giving out or giving way.   ROS: Review of systems otherwise negative except for information present in HPI  HISTORY: Past Medical, Surgical, Social, and Family History Reviewed & Updated per EMR. Pertinent Historical Findings include: Uncontrolled hypertension  DATA REVIEWED: No recent x-rays available to review  PHYSICAL EXAM:  VS: BP:(!) 161/74 mmHg  HR: bpm  TEMP: ( )  RESP:   HT:5\' 2"  (157.5 cm)   WT:112 lb 7 oz (51 kg)  BMI:20.6 RIGHT KNEE EXAM:  General: well nourished, no acute distress Skin of LE: warm; dry, no rashes, lesions, ecchymosis or erythema. Vascular: Dorsal pedal pulses 2+ bilaterally Neurologically: Sensation to light touch lower extremities equal and intact  Normal to inspection with no erythema or effusion or obvious bony abnormalities. Palpation: Tenderness to palpation over the medial and lateral border  of the patella. Bilateral medial and lateral joint line tenderness. No warmth erythema or effusion. Range of motion: ROM normal in flexion and extension and lower leg rotation. Ligaments with solid consistent endpoints including  PCL, LCL, MCL. laxity in ACL No patella apprehension but abnormal tracking Meniscal evaluation: equivocal McMurray's test, mildly antalgic gait Hamstring and quadriceps strength is normal.  ASSESSMENT & PLAN: See problem based charting & AVS for pt instructions. Impression: Suspected mild to moderate osteoarthritis of the right knee with possible underlying degenerative meniscus tear  Recommendations: -Given the patient never completed her x-rays to help us further classify the degree of arthritis in her knee yesterday these done today. -Given that she had such good clinical response from interarticular injection previously I think it's reasonable to repeat this today. -Educated patient and family at bedside that if symptoms do not resolve after injection or returned quickly there is possibility for possible degenerative meniscus tears a source for pain. However patient does not have significant mechanical symptoms. -We'll call with x-ray results using a [place] interpreter -Will follow-up when necessary

## 2014-07-01 ENCOUNTER — Encounter: Payer: Self-pay | Admitting: Sports Medicine

## 2014-07-23 ENCOUNTER — Other Ambulatory Visit: Payer: Self-pay | Admitting: Internal Medicine

## 2014-07-23 ENCOUNTER — Telehealth: Payer: Self-pay | Admitting: Internal Medicine

## 2014-07-23 NOTE — Telephone Encounter (Signed)
Patient has come in today to request a medication refill; please f/u with patient

## 2014-07-26 ENCOUNTER — Telehealth: Payer: Self-pay | Admitting: Internal Medicine

## 2014-07-26 NOTE — Telephone Encounter (Signed)
Patient came into facility to request a med refill for hydrochlorothiazide (HYDRODIURIL) 12.5 MG tablet. [facility]. Please f/u with pt.

## 2014-07-27 ENCOUNTER — Other Ambulatory Visit: Payer: Self-pay | Admitting: Internal Medicine

## 2014-07-27 ENCOUNTER — Telehealth: Payer: Self-pay | Admitting: Internal Medicine

## 2014-07-27 NOTE — Telephone Encounter (Signed)
Patient called to request a med refill for Ibuprofen 600 mg, [facility], please /fu with pt.

## 2014-07-27 NOTE — Telephone Encounter (Signed)
May refill, but she needs to have a BMET soon. I want to make she her potassium is within normal limits

## 2014-10-16 ENCOUNTER — Emergency Department (HOSPITAL_COMMUNITY): Payer: Medicaid Other

## 2014-10-16 ENCOUNTER — Emergency Department (HOSPITAL_COMMUNITY)
Admission: EM | Admit: 2014-10-16 | Discharge: 2014-10-17 | Disposition: A | Discharge status: Home / Self Care | Payer: Medicaid Other | Attending: Emergency Medicine | Admitting: Emergency Medicine

## 2014-10-16 ENCOUNTER — Encounter (HOSPITAL_COMMUNITY): Payer: Self-pay

## 2014-10-16 DIAGNOSIS — G8929 Other chronic pain: Secondary | ICD-10-CM | POA: Insufficient documentation

## 2014-10-16 DIAGNOSIS — K219 Gastro-esophageal reflux disease without esophagitis: Secondary | ICD-10-CM | POA: Insufficient documentation

## 2014-10-16 DIAGNOSIS — Z79899 Other long term (current) drug therapy: Secondary | ICD-10-CM | POA: Diagnosis not present

## 2014-10-16 DIAGNOSIS — I10 Essential (primary) hypertension: Secondary | ICD-10-CM | POA: Diagnosis not present

## 2014-10-16 DIAGNOSIS — R42 Dizziness and giddiness: Secondary | ICD-10-CM | POA: Diagnosis present

## 2014-10-16 DIAGNOSIS — M170 Bilateral primary osteoarthritis of knee: Secondary | ICD-10-CM | POA: Diagnosis not present

## 2014-10-16 LAB — BASIC METABOLIC PANEL
Anion gap: 9 (ref 5–15)
BUN: 7 mg/dL (ref 6–20)
CALCIUM: 9.3 mg/dL (ref 8.9–10.3)
CO2: 23 mmol/L (ref 22–32)
Chloride: 104 mmol/L (ref 101–111)
Creatinine, Ser: 0.83 mg/dL (ref 0.44–1.00)
GFR calc Af Amer: >60 mL/min (ref >60)
GFR calc non Af Amer: >60 mL/min (ref >60)
GLUCOSE: 101 mg/dL — AB (ref 65–99)
Potassium: 3.5 mmol/L (ref 3.5–5.1)
Sodium: 136 mmol/L (ref 135–145)

## 2014-10-16 LAB — CBC
HCT: 38 % (ref 36.0–46.0)
HEMOGLOBIN: 12.4 g/dL (ref 12.0–15.0)
MCH: 27.1 pg (ref 26.0–34.0)
MCHC: 32.6 g/dL (ref 30.0–36.0)
MCV: 83 fL (ref 78.0–100.0)
Platelets: 179 10*3/uL (ref 150–400)
RBC: 4.58 MIL/uL (ref 3.87–5.11)
RDW: 14.5 % (ref 11.5–15.5)
WBC: 7.4 10*3/uL (ref 4.0–10.5)

## 2014-10-16 LAB — CBG MONITORING, ED: GLUCOSE-CAPILLARY: 94 mg/dL (ref 65–99)

## 2014-10-16 LAB — TROPONIN I

## 2014-10-16 MED ORDER — HYDRALAZINE HCL 25 MG PO TABS
25.0000 mg | ORAL_TABLET | Freq: Three times a day (TID) | ORAL | Status: DC
  Administered 2014-10-16: 25 mg via ORAL
  Filled 2014-10-16: qty 1

## 2014-10-16 NOTE — ED Provider Notes (Signed)
CSN: [id number]     Arrival date & time 10/16/14  2053 History   First MD Initiated Contact with Patient 10/16/14 2200     Chief Complaint  Patient presents with  . Joint Pain  . Dizziness     (Consider location/radiation/quality/duration/timing/severity/associated sxs/prior Treatment) HPI Comments: Patient presents to the emergency department with chief complaints of dizziness. Patient states that she has been having dizziness for the past 3 days or so. She denies any headache. Denies any chest pain or shortness of breath. She states that she feels like she does have a "fever in her chest." Additionally, she complains of bilateral knee pain. There are no aggravating or alleviating factors. Patient did not take her blood pressure medicine tonight. She denies any weakness, numbness, or difficulty speaking.  The history is provided by the patient. The history is limited by a language barrier. A language interpreter was used.    Past Medical History  Diagnosis Date  . Hypertension   . Chronic knee pain   . GERD (gastroesophageal reflux disease)   . Gallstone pancreatitis   . Cholelithiasis    Past Surgical History  Procedure Laterality Date  . External ear surgery      right mastoidectomy  . Cholecystectomy N/A 10/04/2013    Procedure: LAPAROSCOPIC CHOLECYSTECTOMY WITH INTRAOPERATIVE CHOLANGIOGRAM;  Surgeon: Axel Filler, MD;  Location: [facility] OR;  Service: General;  Laterality: N/A;   Family History  Problem Relation Age of Onset  . Diabetes Father    Social History  Substance Use Topics  . Smoking status: Never Smoker   . Smokeless tobacco: None  . Alcohol Use: No   OB History    No data available     Review of Systems  Constitutional: Negative for fever and chills.  Respiratory: Negative for shortness of breath.   Cardiovascular: Negative for chest pain.  Gastrointestinal: Negative for nausea, vomiting, diarrhea and constipation.  Genitourinary: Negative for dysuria.   Musculoskeletal: Positive for arthralgias.  Neurological: Positive for dizziness.  All other systems reviewed and are negative.     Allergies  Feosol  Home Medications   Prior to Admission medications   Medication Sig Start Date End Date Taking? Authorizing Provider  acetaminophen (TYLENOL) 325 MG tablet Take 650 mg by mouth every 6 (six) hours as needed for mild pain or fever.    Historical Provider, MD  famotidine (PEPCID) 20 MG tablet Take 20 mg by mouth 2 (two) times daily.    Historical Provider, MD  hydrALAZINE (APRESOLINE) 25 MG tablet Take 1 tablet (25 mg total) by mouth 3 (three) times daily. 05/27/14   [name], NP  hydrochlorothiazide (HYDRODIURIL) 12.5 MG tablet Take 1 tablet (12.5 mg total) by mouth daily. 04/19/14   [name], NP  loratadine (CLARITIN) 10 MG tablet Take 1 tablet (10 mg total) by mouth daily. 05/27/14   [name], NP   BP 202/79 mmHg  Pulse 75  Temp(Src) 98 F (36.7 C) (Oral)  Resp 13  SpO2 98% Physical Exam  Constitutional: She is oriented to person, place, and time. She appears well-developed and well-nourished.  HENT:  Head: Normocephalic and atraumatic.  Eyes: Conjunctivae and EOM are normal. Pupils are equal, round, and reactive to light.  Neck: Normal range of motion. Neck supple.  Cardiovascular: Normal rate and regular rhythm.  Exam reveals no gallop and no friction rub.   No murmur heard. Pulmonary/Chest: Effort normal and breath sounds normal. No respiratory distress. She has no wheezes. She  has no rales. She exhibits no tenderness.  Abdominal: Soft. Bowel sounds are normal. She exhibits no distension and no mass. There is no tenderness. There is no rebound and no guarding.  Musculoskeletal: Normal range of motion. She exhibits no edema or tenderness.  Bilateral knees are unremarkable for bony abnormality or deformity, range of motion strength is 5/5 throughout  Neurological: She is alert and oriented to person, place, and  time.  Normal sensation and strength CN III-12 intact, speech is clear, movements are goal oriented  Skin: Skin is warm and dry.  Psychiatric: She has a normal mood and affect. Her behavior is normal. Judgment and thought content normal.  Nursing note and vitals reviewed.   ED Course  Procedures (including critical care time) Results for orders placed or performed during the hospital encounter of 10/16/14  Basic metabolic panel  Result Value Ref Range   Sodium 136 135 - 145 mmol/L   Potassium 3.5 3.5 - 5.1 mmol/L   Chloride 104 101 - 111 mmol/L   CO2 23 22 - 32 mmol/L   Glucose, Bld 101 (H) 65 - 99 mg/dL   BUN 7 6 - 20 mg/dL   Creatinine, Ser 1.61 0.44 - 1.00 mg/dL   Calcium 9.3 8.9 - 09.6 mg/dL   GFR calc non Af Amer >60 >60 mL/min   GFR calc Af Amer >60 >60 mL/min   Anion gap 9 5 - 15  CBC  Result Value Ref Range   WBC 7.4 4.0 - 10.5 K/uL   RBC 4.58 3.87 - 5.11 MIL/uL   Hemoglobin 12.4 12.0 - 15.0 g/dL   HCT 04.5 40.9 - 81.1 %   MCV 83.0 78.0 - 100.0 fL   MCH 27.1 26.0 - 34.0 pg   MCHC 32.6 30.0 - 36.0 g/dL   RDW 91.4 78.2 - 95.6 %   Platelets 179 150 - 400 K/uL  Troponin I  Result Value Ref Range   Troponin I <0.03 <0.031 ng/mL  CBG monitoring, ED  Result Value Ref Range   Glucose-Capillary 94 65 - 99 mg/dL   Dg Chest 2 View  04/04/863   CLINICAL DATA:  Elevated blood pressure.  Chest pain.  EXAM: CHEST  2 VIEW  COMPARISON:  10/22/2013  FINDINGS: Heart at the upper limits of normal in size. There is atherosclerosis of thoracic aorta. Interstitial prominence appears chronic. No pulmonary edema. No consolidation, pleural effusion, or pneumothorax. No acute osseous abnormalities are seen.  IMPRESSION: Mild cardiomegaly.  No acute pulmonary process.   Electronically Signed   By: Rubye Oaks M.D.   On: 10/16/2014 23:46   Ct Head Wo Contrast  10/17/2014   CLINICAL DATA:  Dizziness and elevated blood pressure since yesterday.  EXAM: CT HEAD WITHOUT CONTRAST  TECHNIQUE:  Contiguous axial images were obtained from the base of the skull through the vertex without intravenous contrast.  COMPARISON:  None.  FINDINGS: Mild cerebral atrophy. Ventricles are not significantly dilated. Mild periventricular white matter changes suggesting small vessel ischemia. No mass effect or midline shift. No abnormal extra-axial fluid collections. Gray-white matter junctions are distinct. Basal cisterns are not effaced. No evidence of acute intracranial hemorrhage. No depressed skull fractures. Opacification of some of the ethmoid air cells. Mastoid air cells are not opacified.  IMPRESSION: No acute intracranial abnormalities. Mild diffuse atrophy and small vessel ischemic changes.   Electronically Signed   By: Burman Nieves M.D.   On: 10/17/2014 00:15   Dg Knee Complete 4 Views Left  10/16/2014  CLINICAL DATA:  Right and left knee pain for 1 week.  EXAM: LEFT KNEE - COMPLETE 4+ VIEW  COMPARISON:  Radiographs 06/25/2014  FINDINGS: No fracture or dislocation. The alignment and joint spaces are maintained. Small peripheral medial tibial femoral osteophytes. No joint effusion or focal soft tissue abnormality.  IMPRESSION: Mild medial tibial femoral osteoarthritis without acute osseous abnormality.   Electronically Signed   By: Rubye Oaks M.D.   On: 10/16/2014 23:43   Dg Knee Complete 4 Views Right  10/16/2014   CLINICAL DATA:  Right and left knee pain for 1 week.  EXAM: RIGHT KNEE - COMPLETE 4+ VIEW  COMPARISON:  06/25/2014  FINDINGS: No fracture or dislocation. Mild medial moderate all osteoarthritis with peripheral osteophytes, unchanged from prior. Joint spaces are preserved. No erosion or periosteal reaction. No joint effusion or focal soft tissue abnormality.  IMPRESSION: Stable degenerative change without acute osseous abnormality.   Electronically Signed   By: Rubye Oaks M.D.   On: 10/16/2014 23:43     I have personally reviewed and evaluated these images and lab results as  part of my medical decision-making.   EKG Interpretation   Date/Time:  Saturday October 16 2014 21:06:07 EDT Ventricular Rate:  76 PR Interval:  146 QRS Duration: 74 QT Interval:  388 QTC Calculation: 436 R Axis:   -8 Text Interpretation:  Normal sinus rhythm Moderate voltage criteria for  LVH, may be normal variant No significant change since last tracing 22 Oct 2013 Confirmed by [facility]  MD-I, IVA ([id number]) on 10/17/2014 12:07:40 AM      MDM   Final diagnoses:  Essential hypertension  Osteoarthritis of both knees, unspecified osteoarthritis type  Dizziness   Patient with complaint of dizziness and arthralgias. Patient has not taken her blood pressure medicine this evening. Suspect this could be the source patient feeling dizzy, but as the history is limited secondary to language., Will check head CT. We'll also image her chest and knees and check basic labs. Anticipate the these will be normal, that the patient can follow-up with her primary care provider.  12:08 AM Patient discussed with Dr. Lynelle Doctor, who agrees with plan.  Recommends giving a dose of clonidine.  If CT is negative and patient's BP improves and symptoms improve plan for discharge.  Patient has chronic knee pain.  Imaging remarkable for osteoarthritis.    Troponin normal, CXR negative, EKG unchanged from prior.  Patient has good follow-up with PCP.  12:33 AM CT scan is negative. Patient reassessed after receiving blood pressure medication. Blood pressure is trending down. Patient states that she no longer feels dizzy. Patient given reassurance regarding knee pain. I will give her a knee sleeve for comfort, and recommend that she follow-up with her primary care provider for the knee pain as well as for additional treatment options for hypertension. Patient understands and agrees with the plan. She is very well-appearing. She is stable and ready for discharge.    Roxy Horseman, PA-C 10/17/14 6045  Devoria Albe,  MD 10/17/14 [phone]

## 2014-10-16 NOTE — ED Notes (Addendum)
Pt here for joint pain to her knees and elbows for a week. Has high BP in triage. Reports feeling swimmy headed since yesterday like the room is spinning and feels like she is hurting from the inside of her heart. Takes her BP meds three times a day and has taken them twice today.

## 2014-10-17 MED ORDER — CLONIDINE HCL 0.1 MG PO TABS
0.1000 mg | ORAL_TABLET | Freq: Once | ORAL | Status: AC
  Administered 2014-10-17: 0.1 mg via ORAL
  Filled 2014-10-17: qty 1

## 2014-10-17 NOTE — ED Notes (Signed)
Patient left at this time with all belongings. Provided knee sleeves to go home with.

## 2014-10-17 NOTE — ED Notes (Signed)
Pt ambulate in hall

## 2014-10-17 NOTE — Discharge Instructions (Signed)
Osteoarthritis Osteoarthritis is a disease that causes soreness and inflammation of a joint. It occurs when the cartilage at the affected joint wears down. Cartilage acts as a cushion, covering the ends of bones where they meet to form a joint. Osteoarthritis is the most common form of arthritis. It often occurs in older people. The joints affected most often by this condition include those in the:  Ends of the fingers.  Thumbs.  Neck.  Lower back.  Knees.  Hips. CAUSES  Over time, the cartilage that covers the ends of bones begins to wear away. This causes bone to rub on bone, producing pain and stiffness in the affected joints.  RISK FACTORS Certain factors can increase your chances of having osteoarthritis, including:  Older age.  Excessive body weight.  Overuse of joints.  Previous joint injury. SIGNS AND SYMPTOMS   Pain, swelling, and stiffness in the joint.  Over time, the joint may lose its normal shape.  Small deposits of bone (osteophytes) may grow on the edges of the joint.  Bits of bone or cartilage can break off and float inside the joint space. This may cause more pain and damage. DIAGNOSIS  Your health care provider will do a physical exam and ask about your symptoms. Various tests may be ordered, such as:  X-rays of the affected joint.  An MRI scan.  Blood tests to rule out other types of arthritis.  Joint fluid tests. This involves using a needle to draw fluid from the joint and examining the fluid under a microscope. TREATMENT  Goals of treatment are to control pain and improve joint function. Treatment plans may include:  A prescribed exercise program that allows for rest and joint relief.  A weight control plan.  Pain relief techniques, such as:  Properly applied heat and cold.  Electric pulses delivered to nerve endings under the skin (transcutaneous electrical nerve stimulation [TENS]).  Massage.  Certain nutritional  supplements.  Medicines to control pain, such as:  Acetaminophen.  Nonsteroidal anti-inflammatory drugs (NSAIDs), such as naproxen.  Narcotic or central-acting agents, such as tramadol.  Corticosteroids. These can be given orally or as an injection.  Surgery to reposition the bones and relieve pain (osteotomy) or to remove loose pieces of bone and cartilage. Joint replacement may be needed in advanced states of osteoarthritis. HOME CARE INSTRUCTIONS   Take medicines only as directed by your health care provider.  Maintain a healthy weight. Follow your health care provider's instructions for weight control. This may include dietary instructions.  Exercise as directed. Your health care provider can recommend specific types of exercise. These may include:  Strengthening exercises. These are done to strengthen the muscles that support joints affected by arthritis. They can be performed with weights or with exercise bands to add resistance.  Aerobic activities. These are exercises, such as brisk walking or low-impact aerobics, that get your heart pumping.  Range-of-motion activities. These keep your joints limber.  Balance and agility exercises. These help you maintain daily living skills.  Rest your affected joints as directed by your health care provider.  Keep all follow-up visits as directed by your health care provider. SEEK MEDICAL CARE IF:   Your skin turns red.  You develop a rash in addition to your joint pain.  You have worsening joint pain.  You have a fever along with joint or muscle aches. SEEK IMMEDIATE MEDICAL CARE IF:  You have a significant loss of weight or appetite.  You have night sweats. FOR MORE   INFORMATION   National Institute of Arthritis and Musculoskeletal and Skin Diseases: www.niams.nih.gov  National Institute on Aging: www.nia.nih.gov  American College of Rheumatology: www.rheumatology.org Document Released: 02/05/2005 Document Revised:  06/22/2013 Document Reviewed: 10/13/2012 ExitCare Patient Information 2015 [employer]. This information is not intended to replace advice given to you by your health care provider. Make sure you discuss any questions you have with your health care provider. Hypertension Hypertension, commonly called high blood pressure, is when the force of blood pumping through your arteries is too strong. Your arteries are the blood vessels that carry blood from your heart throughout your body. A blood pressure reading consists of a higher number over a lower number, such as 110/72. The higher number (systolic) is the pressure inside your arteries when your heart pumps. The lower number (diastolic) is the pressure inside your arteries when your heart relaxes. Ideally you want your blood pressure below 120/80. Hypertension forces your heart to work harder to pump blood. Your arteries may become narrow or stiff. Having hypertension puts you at risk for heart disease, stroke, and other problems.  RISK FACTORS Some risk factors for high blood pressure are controllable. Others are not.  Risk factors you cannot control include:   Race. You may be at higher risk if you are African American.  Age. Risk increases with age.  Gender. Men are at higher risk than women before age 45 years. After age 65, women are at higher risk than men. Risk factors you can control include:  Not getting enough exercise or physical activity.  Being overweight.  Getting too much fat, sugar, calories, or salt in your diet.  Drinking too much alcohol. SIGNS AND SYMPTOMS Hypertension does not usually cause signs or symptoms. Extremely high blood pressure (hypertensive crisis) may cause headache, anxiety, shortness of breath, and nosebleed. DIAGNOSIS  To check if you have hypertension, your health care provider will measure your blood pressure while you are seated, with your arm held at the level of your heart. It should be measured  at least twice using the same arm. Certain conditions can cause a difference in blood pressure between your right and left arms. A blood pressure reading that is higher than normal on one occasion does not mean that you need treatment. If one blood pressure reading is high, ask your health care provider about having it checked again. TREATMENT  Treating high blood pressure includes making lifestyle changes and possibly taking medicine. Living a healthy lifestyle can help lower high blood pressure. You may need to change some of your habits. Lifestyle changes may include:  Following the DASH diet. This diet is high in fruits, vegetables, and whole grains. It is low in salt, red meat, and added sugars.  Getting at least 2 hours of brisk physical activity every week.  Losing weight if necessary.  Not smoking.  Limiting alcoholic beverages.  Learning ways to reduce stress. If lifestyle changes are not enough to get your blood pressure under control, your health care provider may prescribe medicine. You may need to take more than one. Work closely with your health care provider to understand the risks and benefits. HOME CARE INSTRUCTIONS  Have your blood pressure rechecked as directed by your health care provider.   Take medicines only as directed by your health care provider. Follow the directions carefully. Blood pressure medicines must be taken as prescribed. The medicine does not work as well when you skip doses. Skipping doses also puts you at risk for problems.     Do not smoke.   Monitor your blood pressure at home as directed by your health care provider. SEEK MEDICAL CARE IF:   You think you are having a reaction to medicines taken.  You have recurrent headaches or feel dizzy.  You have swelling in your ankles.  You have trouble with your vision. SEEK IMMEDIATE MEDICAL CARE IF:  You develop a severe headache or confusion.  You have unusual weakness, numbness, or feel  faint.  You have severe chest or abdominal pain.  You vomit repeatedly.  You have trouble breathing. MAKE SURE YOU:   Understand these instructions.  Will watch your condition.  Will get help right away if you are not doing well or get worse. Document Released: 02/05/2005 Document Revised: 06/22/2013 Document Reviewed: 11/28/2012 ExitCare Patient Information 2015 [employer]. This information is not intended to replace advice given to you by your health care provider. Make sure you discuss any questions you have with your health care provider.  

## 2014-10-29 ENCOUNTER — Encounter: Payer: Self-pay | Admitting: Internal Medicine

## 2014-10-29 ENCOUNTER — Ambulatory Visit: Payer: Medicaid Other | Attending: Internal Medicine | Admitting: Internal Medicine

## 2014-10-29 VITALS — BP 168/82 | HR 70 | Temp 98.0°F | Resp 16 | Ht 62.0 in | Wt 134.2 lb

## 2014-10-29 DIAGNOSIS — M179 Osteoarthritis of knee, unspecified: Secondary | ICD-10-CM | POA: Diagnosis not present

## 2014-10-29 DIAGNOSIS — I10 Essential (primary) hypertension: Secondary | ICD-10-CM

## 2014-10-29 DIAGNOSIS — M1711 Unilateral primary osteoarthritis, right knee: Secondary | ICD-10-CM

## 2014-10-29 DIAGNOSIS — R42 Dizziness and giddiness: Secondary | ICD-10-CM | POA: Diagnosis not present

## 2014-10-29 DIAGNOSIS — R413 Other amnesia: Secondary | ICD-10-CM | POA: Insufficient documentation

## 2014-10-29 MED ORDER — HYDRALAZINE HCL 25 MG PO TABS: 25.0000 mg | ORAL_TABLET | Freq: Three times a day (TID) | ORAL | Status: DC

## 2014-10-29 NOTE — Progress Notes (Signed)
Patient ID: Michelle Boyd, female   DOB: 12-Apr-1950, 64 y.o.   MRN: [id number]  CC: ED follow up  HPI: Michelle Boyd is a 64 y.o. female here today for a follow up visit.  Patient has past medical history of of HTN, OA, GERD.  Patient was seen in the ED on 8/27 for symptoms of dizziness. Patient was found to have a elevated blood pressure and was given Clonidine which resolved the dizziness. Today she reports the dizziness has improved but she has only taken one dose of hydralazine today. She states that if no one is home she often forgets to take the noon dose of her medication.  Patient reports that she has continued to have right knee pain and would like a repeat injection. She reports good response in pain after injection. She has previously been seen by [facility] sports medicine. Wants home health aide to help patient with safety. Patient's family states that she often forgets to turn off the stove, where she is going, or faces. Her family does not feel safe leaving her home alone.  Due to patient's chronic left knee pain she also has ambulation concerns.   Allergies  Allergen Reactions  . Feosol [Iron] Itching   Past Medical History  Diagnosis Date  . Hypertension   . Chronic knee pain   . GERD (gastroesophageal reflux disease)   . Gallstone pancreatitis   . Cholelithiasis    Current Outpatient Prescriptions on File Prior to Visit  Medication Sig Dispense Refill  . hydrALAZINE (APRESOLINE) 25 MG tablet Take 1 tablet (25 mg total) by mouth 3 (three) times daily. 90 tablet 3  . acetaminophen (TYLENOL) 325 MG tablet Take 650 mg by mouth every 6 (six) hours as needed for mild pain or fever.    . hydrochlorothiazide (HYDRODIURIL) 12.5 MG tablet Take 1 tablet (12.5 mg total) by mouth daily. (Patient not taking: Reported on 10/16/2014) 30 tablet 2  . loratadine (CLARITIN) 10 MG tablet Take 1 tablet (10 mg total) by mouth daily. (Patient not taking: Reported on 10/16/2014) 30 tablet 11  .  [DISCONTINUED] chlorthalidone (HYGROTON) 25 MG tablet Take 25 mg by mouth daily.    . [DISCONTINUED] lisinopril (PRINIVIL,ZESTRIL) 10 MG tablet Take 10 mg by mouth daily.     No current facility-administered medications on file prior to visit.   Family History  Problem Relation Age of Onset  . Diabetes Father    Social History   Social History  . Marital Status: Married    Spouse Name: N/A  . Number of Children: N/A  . Years of Education: N/A   Occupational History  . Not on file.   Social History Main Topics  . Smoking status: Never Smoker   . Smokeless tobacco: Not on file  . Alcohol Use: No  . Drug Use: No  . Sexual Activity: No   Other Topics Concern  . Not on file   Social History Narrative    Review of Systems  Musculoskeletal: Positive for back pain and joint pain ( right knee).  Neurological: Positive for dizziness. Negative for headaches.  Psychiatric/Behavioral: Positive for memory loss.  All other systems reviewed and are negative.   Objective:   Filed Vitals:   10/29/14 1453  BP: 168/82  Pulse: 70  Temp: 98 F (36.7 C)  Resp: 16    Physical Exam  Cardiovascular: Normal rate and regular rhythm.   Pulmonary/Chest: Effort normal and breath sounds normal.  Musculoskeletal: Normal range of motion. She exhibits  no edema or tenderness.  Neurological: She is alert.  Skin: Skin is warm and dry.  Psychiatric: She has a normal mood and affect.     Lab Results  Component Value Date   WBC 7.4 10/16/2014   HGB 12.4 10/16/2014   HCT 38.0 10/16/2014   MCV 83.0 10/16/2014   PLT 179 10/16/2014   Lab Results  Component Value Date   CREATININE 0.83 10/16/2014   BUN 7 10/16/2014   NA 136 10/16/2014   K 3.5 10/16/2014   CL 104 10/16/2014   CO2 23 10/16/2014    Lab Results  Component Value Date   HGBA1C 6.4* 04/04/2012   Lipid Panel     Component Value Date/Time   CHOL 169 01/07/2013 1231   TRIG 199* 01/07/2013 1231   HDL 38* 01/07/2013  1231   CHOLHDL 4.4 01/07/2013 1231   VLDL 40 01/07/2013 1231   LDLCALC 91 01/07/2013 1231       Assessment and plan:   Michelle Boyd was seen today for follow-up.  Diagnoses and all orders for this visit:  Memory loss -     Ambulatory referral to Home Health -     Ambulatory referral to Neurology  Due to safety issues  I will place order for home health aide.  Patient will also need referral to neurology to assess memory issues.  Essential hypertension -     hydrALAZINE (APRESOLINE) 25 MG tablet; Take 1 tablet (25 mg total) by mouth 3 (three) times daily. Patient blood pressure remains elevated today, will increase BP medication and have patient to return in 2 weeks for blood pressure recheck with nurse. Stressed diet changes, regular exercise regimen, and modifiable risk factors. Will follow up with CMP as needed, Will follow up with patient in 3-6 months.   I have asked family to set a Lyman Bishop for patient to help her to remember to take her medication.  Also believe dizziness is a result of elevated blood pressure.  Home health aide may survey as a resource to help patient with medication compliance.  Dizziness  see above  Osteoarthritis of right knee, unspecified osteoarthritis type  I have given patient the number to [facility] sports medicine to make a follow-up appointment   Due to language barrier, an interpreter was present during the history-taking and subsequent discussion (and for part of the physical exam) with this patient.      Return in about 3 months (around 01/28/2015) for Hypertension.    [name], NP-C [facility] [phone] 10/29/2014, 3:07 PM

## 2014-10-29 NOTE — Patient Instructions (Addendum)
Tylenol arthritis for knee pain  Call sports medicine Monday [phone] the office is already closed today Please set alarm to remind patient to take BP medication

## 2014-10-29 NOTE — Progress Notes (Signed)
Patient here with interpreter Patient states she is here for a follow up from the ED Was seen in the ed for numbness and tingling to her feet Patient states she only takes hydralazine-no other medications

## 2014-11-11 ENCOUNTER — Encounter: Payer: Self-pay | Admitting: Sports Medicine

## 2014-11-11 ENCOUNTER — Ambulatory Visit (INDEPENDENT_AMBULATORY_CARE_PROVIDER_SITE_OTHER): Payer: Medicaid Other | Admitting: Sports Medicine

## 2014-11-11 VITALS — BP 167/73 | HR 75 | Temp 98.1°F | Wt 133.0 lb

## 2014-11-11 DIAGNOSIS — M25561 Pain in right knee: Secondary | ICD-10-CM

## 2014-11-11 DIAGNOSIS — M25562 Pain in left knee: Secondary | ICD-10-CM | POA: Diagnosis not present

## 2014-11-11 DIAGNOSIS — G8929 Other chronic pain: Secondary | ICD-10-CM | POA: Diagnosis not present

## 2014-11-11 MED ORDER — METHYLPREDNISOLONE ACETATE 40 MG/ML IJ SUSP
80.0000 mg | Freq: Once | INTRAMUSCULAR | Status: AC
  Administered 2014-11-11: 80 mg via INTRA_ARTICULAR

## 2014-11-11 NOTE — Assessment & Plan Note (Signed)
Pain likely 2/2 moderate osteoarthritic changes of the knees bilaterally. Relatively well preserved joint space makes successful nonsurgical treatment more likely. Patient had reported great benefit from prior knee injections. And was interested in some today. - Bilateral CSI provided today. - discussed the benefit of icing regularly. - OTC antiinflammatories. - patient is to f/u PRN

## 2014-11-11 NOTE — Progress Notes (Signed)
   HPI  CC: Bilateral Knee Pain Patient is a pleasant 64 year old female from [place] who presents today for bilateral knee pain (R>L). She was seen in our office in May of this year and July 2015 with right sided knee pain. She states that now her left knee has been gradually worsening. At both prior visits she received an intraarticular injection into her right knee. She reports great symptomatic relief from these injections. She is interested in repeating injection today. She denies any new trauma, injury, or instability since her last visit (Note: reports ACL tear ~ 64yo). She denies any popping, clicking, catching, or swelling. Pain is at the medial and lateral joint lines (bilaterally) and inferomedial pole of the patella (R>L).   ROS: no swelling, erythema, fever, chills, weakness, numbness, or paresthesias.  Objective: BP 167/73 mmHg  Pulse 75  Temp(Src) 98.1 F (36.7 C) (Oral)  Wt 133 lb (60.328 kg) Gen: NAD, alert, cooperative, and pleasant. Nepalese speaking (interpretor use via phone) Knee: Normal to inspection with no erythema or effusion. Some slight bony prominence noted at medial joint lines suggestive of osteophyte development. No warmth. Joint line (medial and lateral) and patellar (inferomedial) tenderness bilaterally. ROM normal in flexion and extension and lower leg rotation. Ligaments with solid endpoints (PCL, LCL, MCL) -- some laxity in right ACL when compared to Left. Negative Mcmurray's. Non painful patellar compression. Hamstring and quadriceps strength is normal. Neuro: Alert and oriented, Speech clear, No gross deficits  INJECTION: Bilateral intraarticular kee injections Patient was given informed consent, signed copy in the chart. Appropriate time out was taken. Area prepped and draped in usual sterile fashion. 1cc of methylprednisolone 40 mg/ml plus  3cc of 1% lidocaine without epinephrine was injected into the right knee using a(n) anterolateral approach. The procedure  was repeated using the same techinque and process for the left knee. The patient tolerated the procedure well. There were no complications. Post procedure instructions were given.  X-Ray: 4 view bilateral knees >> were reviewed by Dr. Margaretha Sheffield and I. Diffuse arthritic changes noted in bilateral knees. Joint space narrowing is appreciated, but relatively well preserved bilaterally. Some associated bony sclerosis noted. Osteophyte development noted throughout. No femoral-tibial lateral deviation appreciated at this time.    Assessment and plan:  Bilateral chronic knee pain Pain likely 2/2 moderate osteoarthritic changes of the knees bilaterally. Relatively well preserved joint space makes successful nonsurgical treatment more likely. Patient had reported great benefit from prior knee injections. And was interested in some today. - Bilateral CSI provided today. - discussed the benefit of icing regularly. - OTC antiinflammatories. - patient is to f/u PRN    Kathee Delton, MD,MS,  PGY2 11/11/2014 10:46 AM   Patient was seen and evaluated with the above-named resident. I agree with the plan of care. I supervised the bilateral knee injections. These are performed without difficulty. Patient is to continue with over-the-counter anti-inflammatory's as needed and follow-up with me prn.

## 2014-11-22 ENCOUNTER — Ambulatory Visit (INDEPENDENT_AMBULATORY_CARE_PROVIDER_SITE_OTHER): Payer: Medicaid Other | Admitting: Diagnostic Neuroimaging

## 2014-11-22 ENCOUNTER — Encounter: Payer: Self-pay | Admitting: Diagnostic Neuroimaging

## 2014-11-22 VITALS — BP 198/89 | HR 65 | Wt 134.0 lb

## 2014-11-22 DIAGNOSIS — R413 Other amnesia: Secondary | ICD-10-CM | POA: Diagnosis not present

## 2014-11-22 IMAGING — CR DG LUMBAR SPINE 2-3V
4 series · 4 of 4 positions shown · non-contrast
Comparison: None.

CLINICAL DATA: 2 month history of persistent low back pain. No
recent injuries.

EXAM:
LUMBAR SPINE - 2-3 VIEW

[t l-spine a.p.]
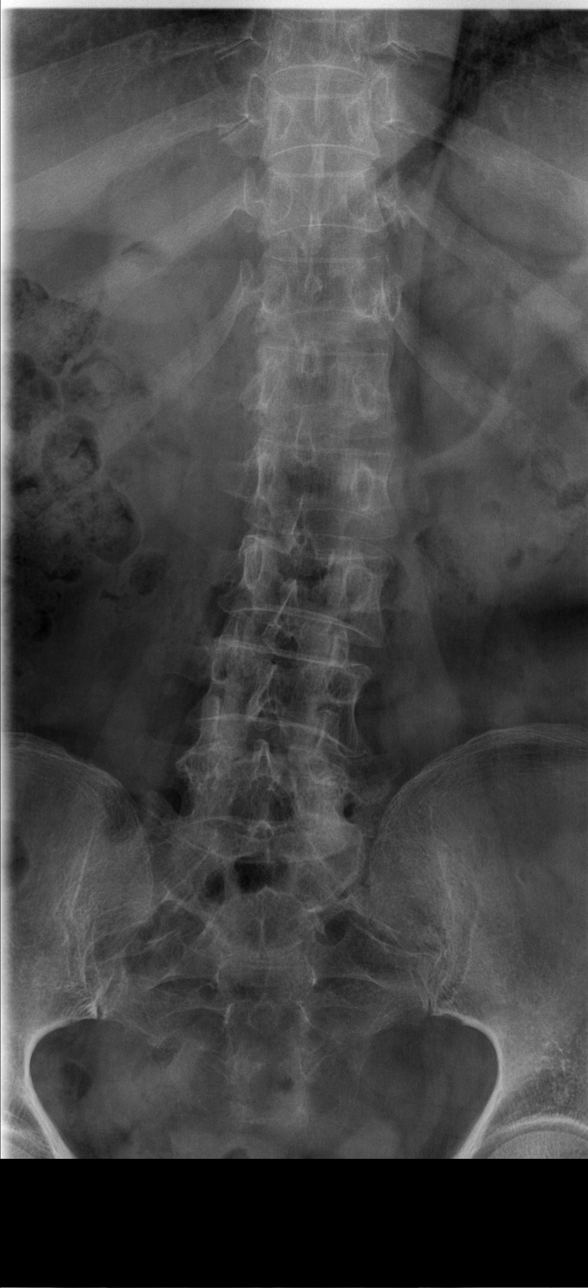

[t l-spine lat (1 of 2)]
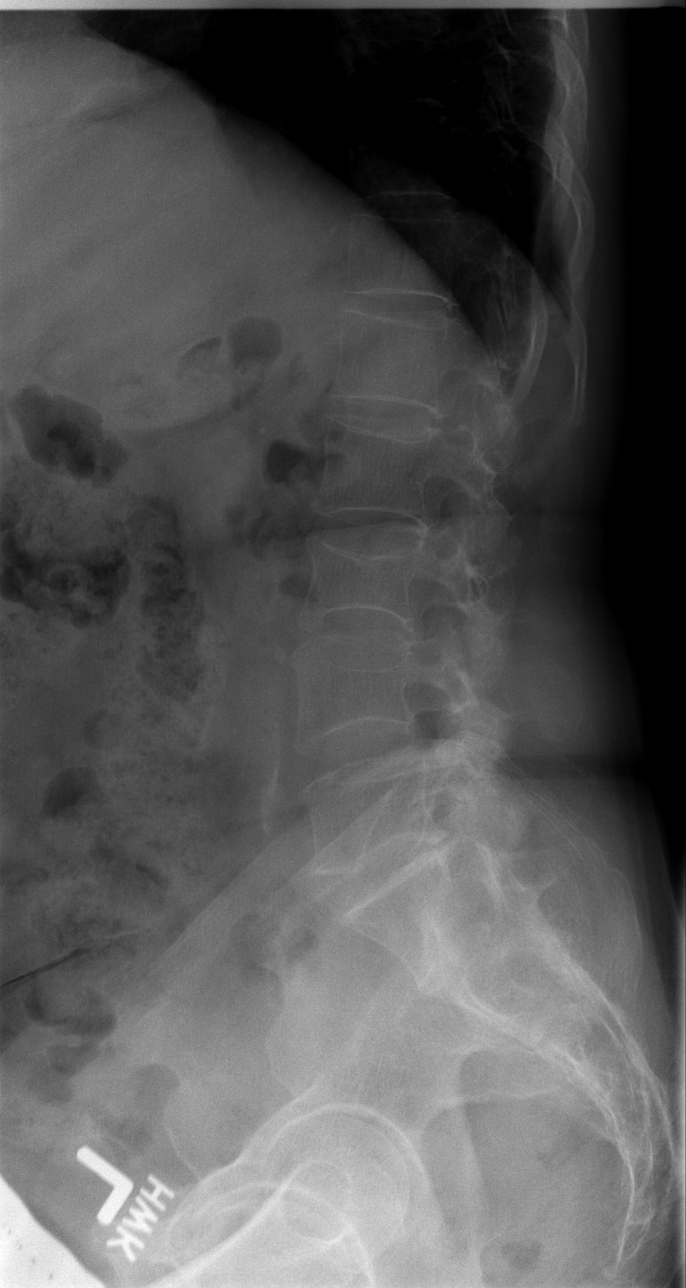

[t l-spine l5-s1 spot]
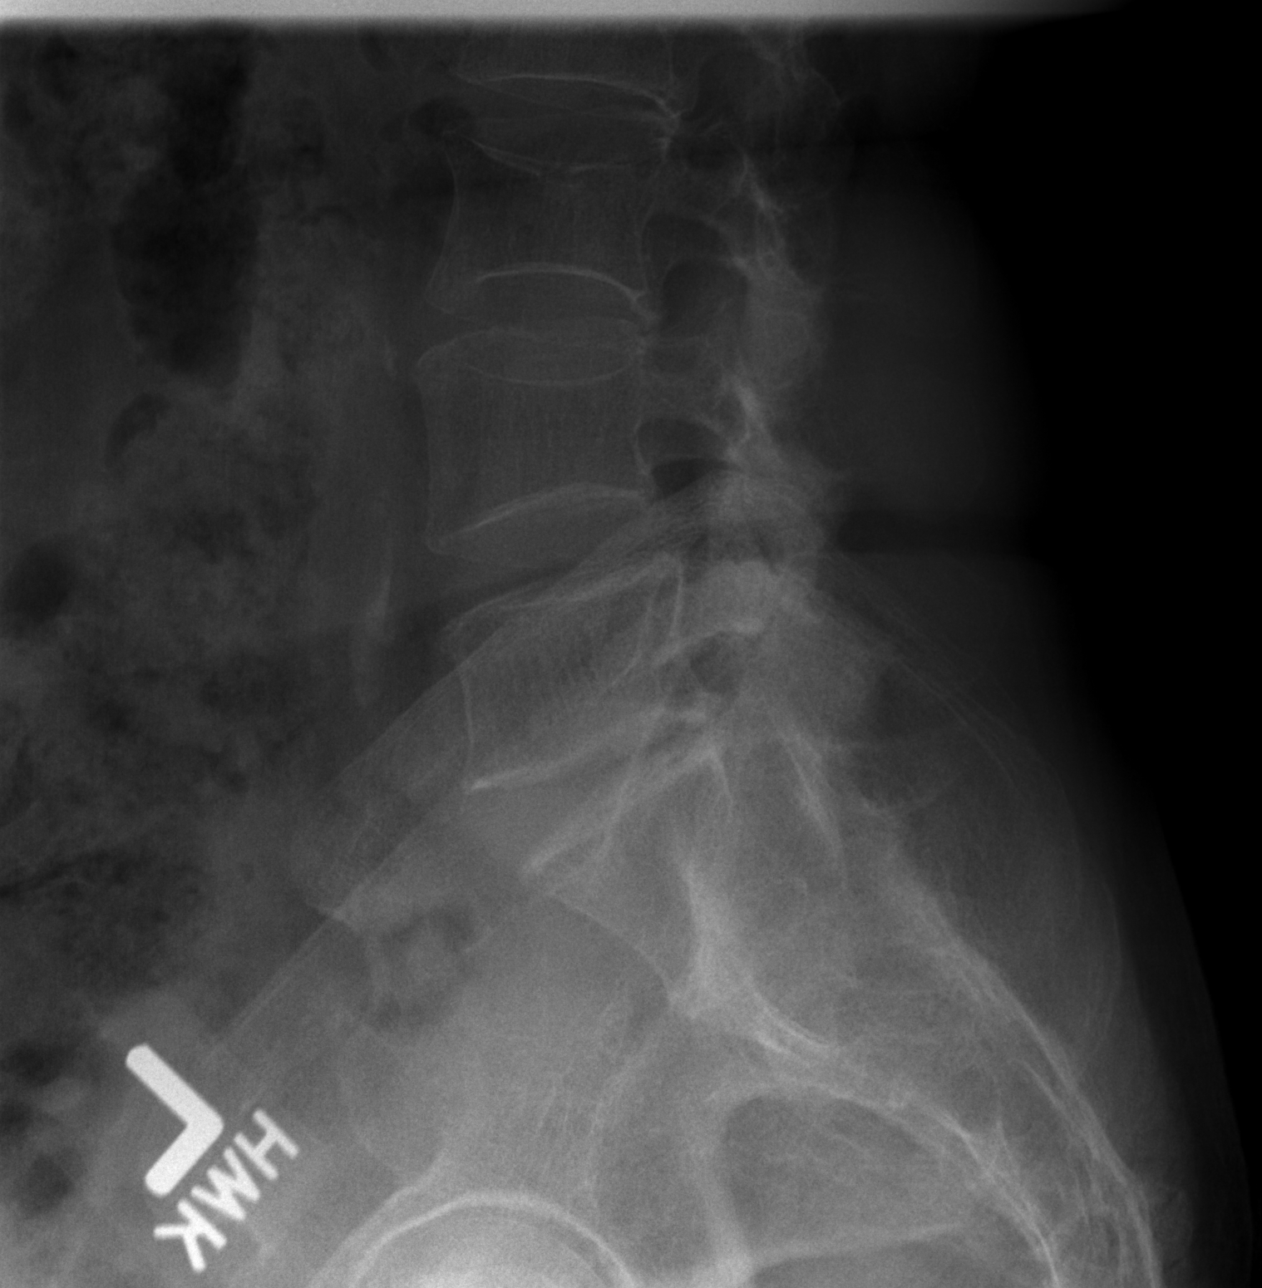

[t l-spine lat (2 of 2)]
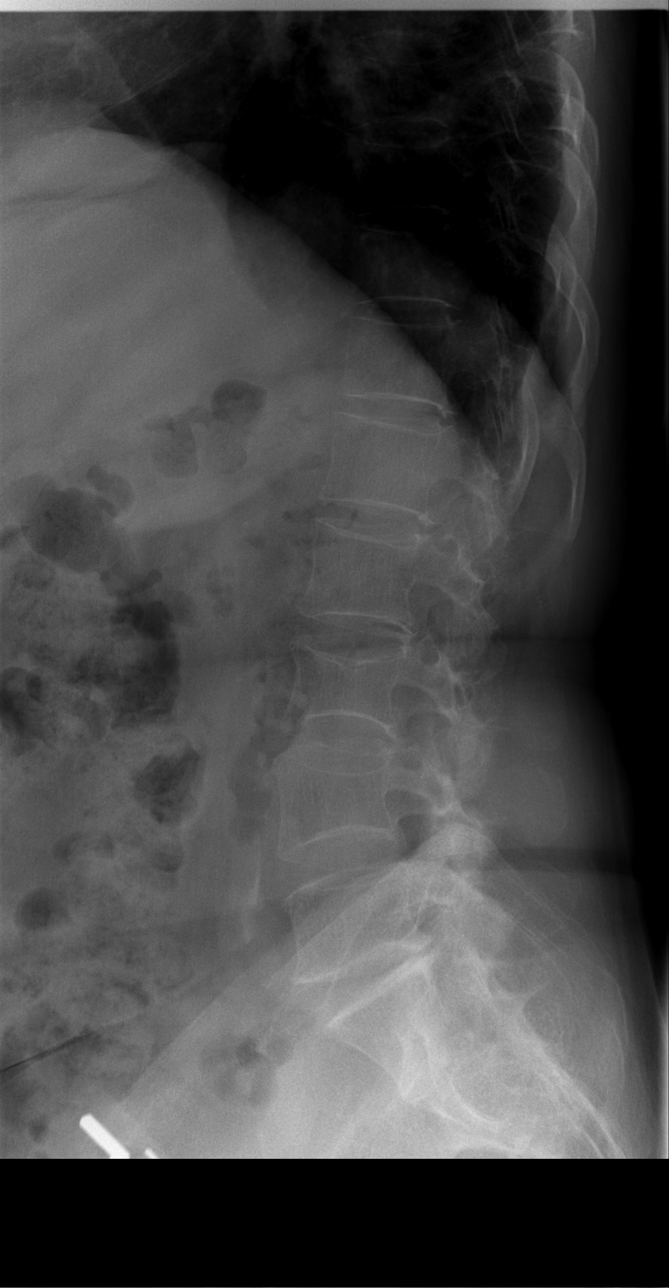

[4 of 4 positions shown; findings below may reference images not displayed]

FINDINGS: Five non rib-bearing lumbar vertebrae with anatomic alignment.
Compression fracture involving the upper and lower endplate of L3 on
the order of 30% or so. Compression fracture involving the upper
endplate of L4 on the order of 20%. Severe osseous demineralization.
Moderate disc space narrowing and endplate hypertrophic changes at
L5-S1. Facet degenerative changes at L5-S1. Visualized sacroiliac
joints intact.
IMPRESSION: 1. Osteoporotic compression fractures of L3 and L4 as described
above.
2. Moderate degenerative disk disease and spondylosis and facet
degenerative changes at L5-S1.

## 2014-11-22 NOTE — Progress Notes (Signed)
[facility]  PATIENT: Michelle Boyd DOB: May 03, 1950  REFERRING CLINICIAN: Keck  HISTORY FROM: patient and son and friend (via interpreter) REASON FOR VISIT: new consult   HISTORICAL  CHIEF COMPLAINT:  Chief Complaint  Patient presents with  . Memory Loss    rm 7, New Patient, son - Tawanna Cooler, interpreter present    HISTORY OF PRESENT ILLNESS:   64 year old female here for evaluation of memory loss. Patient is originally from [place], immigrated to [place] in 2011. She is accompanied by her son, his friend and an interpreter.  Patient was doing well in 2011 when she came to the [place]. She was fully independent at that time. Around 3 years ago she had onset of short-term memory problems. She was forgetting to turn off the water, turn off the stove, leaving doors open and unlocked and lights on. This has significant worsened the last 6-12 months. Sometimes she has intermittent confusion. She denies any depression or sadness. She is divorced. Her son and daughter-in-law leave for work every day and patient is alone during most the daytime. They have to help her with her medications.   REVIEW OF SYSTEMS: Full 14 system review of systems performed and notable only for swelling in legs joint pain memory loss confusion numbness weakness dizziness sleepiness restless legs not enough sleep.  ALLERGIES: Allergies  Allergen Reactions  . Feosol [Iron] Itching    HOME MEDICATIONS: Outpatient Prescriptions Prior to Visit  Medication Sig Dispense Refill  . hydrALAZINE (APRESOLINE) 25 MG tablet Take 1 tablet (25 mg total) by mouth 3 (three) times daily. 90 tablet 3  . acetaminophen (TYLENOL) 325 MG tablet Take 650 mg by mouth every 6 (six) hours as needed for mild pain or fever.    . hydrochlorothiazide (HYDRODIURIL) 12.5 MG tablet Take 1 tablet (12.5 mg total) by mouth daily. (Patient not taking: Reported on 10/16/2014) 30 tablet 2  . loratadine (CLARITIN) 10 MG tablet Take 1  tablet (10 mg total) by mouth daily. (Patient not taking: Reported on 10/16/2014) 30 tablet 11   No facility-administered medications prior to visit.    PAST MEDICAL HISTORY: Past Medical History  Diagnosis Date  . Hypertension   . Chronic knee pain   . GERD (gastroesophageal reflux disease)   . Gallstone pancreatitis   . Cholelithiasis   . Memory loss     PAST SURGICAL HISTORY: Past Surgical History  Procedure Laterality Date  . External ear surgery  years ago    right mastoidectomy  . Cholecystectomy N/A 10/04/2013    Procedure: LAPAROSCOPIC CHOLECYSTECTOMY WITH INTRAOPERATIVE CHOLANGIOGRAM;  Surgeon: Axel Filler, MD;  Location: [facility] OR;  Service: General;  Laterality: N/A;  . Cataract extraction  2016    FAMILY HISTORY: Family History  Problem Relation Age of Onset  . Diabetes Father     SOCIAL HISTORY:  Social History   Social History  . Marital Status: Divorced    Spouse Name: N/A  . Number of Children: N/A  . Years of Education: ESL   Occupational History  .      na   Social History Main Topics  . Smoking status: Never Smoker   . Smokeless tobacco: Not on file  . Alcohol Use: No  . Drug Use: No  . Sexual Activity: No   Other Topics Concern  . Not on file   Social History Narrative   Lives at home with son, daughter-in-law   Caffeine use- coffee 3-4 cups daily, tea occas  PHYSICAL EXAM  GENERAL EXAM/CONSTITUTIONAL: Vitals:  Filed Vitals:   11/22/14 1039  BP: 198/89  Pulse: 65  Weight: 134 lb (60.782 kg)     Body mass index is 24.5 kg/(m^2).  No exam data present  Patient is in no distress; well developed, nourished and groomed; neck is supple  CARDIOVASCULAR:  Examination of carotid arteries is normal; no carotid bruits  Regular rate and rhythm, no murmurs  Examination of peripheral vascular system by observation and palpation is normal  EYES:  Ophthalmoscopic exam of optic discs and posterior segments is normal; no  papilledema or hemorrhages  MUSCULOSKELETAL:  Gait, strength, tone, movements noted in Neurologic exam below  NEUROLOGIC: MENTAL STATUS: [MMSE testing performed with interpreter; patient attempted, but gave up on most sections saying "I don't know" or "I can't do this"] MMSE - Mini Mental State Exam 11/22/2014  Orientation to time 0  Orientation to Place 0  Registration 3  Attention/ Calculation 0  Recall 0  Language- name 2 objects 0  Language- repeat 0  Language- follow 3 step command 0  Language- read & follow direction 0  Write a sentence 0  Copy design 0  Total score 3    awake, alert, oriented to person; NOT PLACE OR TIME  REMOTE MEMORY INTACT; RECENT MEMORY DECREASED  DECR ATTENTION AND CONCENTRATION  DECR FLUENCY; COMPREHENSION INTACT, naming intact  fund of knowledge appropriate  CRANIAL NERVE:   2nd - no papilledema on fundoscopic exam --> LEFT EYE CATARACT  2nd, 3rd, 4th, 6th - pupils equal and reactive to light, visual fields full to confrontation, extraocular muscles intact, no nystagmus  5th - facial sensation symmetric  7th - facial strength symmetric  8th - hearing intact  9th - palate elevates symmetrically, uvula midline  11th - shoulder shrug symmetric  12th - tongue protrusion midline  MOTOR:   normal bulk and tone, full strength in the BUE; LOWER EXT LIMITED BY PAIN (3-4)  SENSORY:   normal and symmetric to light touch; DECR VIB AND TEMP IN FEET  COORDINATION:   finger-nose-finger SLOW  REFLEXES:   deep tendon reflexes TRACE and symmetric  GAIT/STATION:   narrow based gait; USES SINGLE POINT CANE; SLOW UNSTEADY GAIT    DIAGNOSTIC DATA (LABS, IMAGING, TESTING) - I reviewed patient records, labs, notes, testing and imaging myself where available.  Lab Results  Component Value Date   WBC 7.4 10/16/2014   HGB 12.4 10/16/2014   HCT 38.0 10/16/2014   MCV 83.0 10/16/2014   PLT 179 10/16/2014      Component Value  Date/Time   NA 136 10/16/2014 2111   K 3.5 10/16/2014 2111   CL 104 10/16/2014 2111   CO2 23 10/16/2014 2111   GLUCOSE 101* 10/16/2014 2111   BUN 7 10/16/2014 2111   CREATININE 0.83 10/16/2014 2111   CALCIUM 9.3 10/16/2014 2111   PROT 6.3 10/25/2013 0344   ALBUMIN 2.2* 10/25/2013 0344   AST 43* 10/25/2013 0344   ALT 34 10/25/2013 0344   ALKPHOS 102 10/25/2013 0344   BILITOT 0.7 10/25/2013 0344   GFRNONAA >60 10/16/2014 2111   GFRAA >60 10/16/2014 2111   Lab Results  Component Value Date   CHOL 169 01/07/2013   HDL 38* 01/07/2013   LDLCALC 91 01/07/2013   TRIG 199* 01/07/2013   CHOLHDL 4.4 01/07/2013   Lab Results  Component Value Date   HGBA1C 6.4* 04/04/2012   Lab Results  Component Value Date   VITAMINB12 394 10/23/2013  Lab Results  Component Value Date   TSH 0.567 10/03/2013    10/16/14 CT head [I reviewed images myself and agree with interpretation. -VRP]  - No acute intracranial abnormalities. Mild diffuse atrophy and small vessel ischemic changes.    ASSESSMENT AND PLAN  65 y.o. year old female here with progressive memory loss since past 3 years, especially in last 3 months. Could represent neurodegenerative dementia, vascular dementia, pseudo-dementia of depression, metabolic, hypertensive encephalopathy or other cause.   Ddx:  Memory loss     PLAN: - safety and supervision issues are 1st priority; home health aid has been ordered by PCP - follow up with PCP re: BP mgmt (today's BP 198/89)  Orders Placed This Encounter  Procedures  . Vitamin B12  . Hemoglobin A1c  . TSH   Return in about 3 months (around 02/22/2015).    Suanne Marker, MD 11/22/2014, 11:14 AM Certified in Neurology, Neurophysiology and Neuroimaging  [facility] [address] [place], [place] [address] [phone]

## 2014-11-22 NOTE — Patient Instructions (Signed)
Thank you for coming to see [place] at [facility]. I hope we have been able to provide you high quality care today.  You may receive a patient satisfaction survey over the next few weeks. We would appreciate your feedback and comments so that we may continue to improve ourselves and the health of our patients.  - follow up with PCP regarding high blood pressure - try tylenol or ibuprofen as needed for knee pain   ~~~~~~~~~~~~~~~~~~~~~~~~~~~~~~~~~~~~~~~~~~~~~~~~~~~~~~~~~~~~~~~~~

## 2014-11-23 ENCOUNTER — Telehealth: Payer: Self-pay | Admitting: *Deleted

## 2014-11-23 LAB — VITAMIN B12: Vitamin B-12: 318 pg/mL (ref 211–946)

## 2014-11-23 LAB — HEMOGLOBIN A1C
Est. average glucose Bld gHb Est-mCnc: 120 mg/dL
Hgb A1c MFr Bld: 5.8 % — ABNORMAL HIGH (ref 4.8–5.6)

## 2014-11-23 LAB — TSH: TSH: 0.462 u[IU]/mL (ref 0.450–4.500)

## 2014-11-23 NOTE — Telephone Encounter (Signed)
-----   Message from Suanne Marker, MD sent at 11/23/2014 11:02 AM EDT ----- Labs ok. Let pt and family know. -VRP

## 2014-11-23 NOTE — Telephone Encounter (Signed)
Left vm for patient's son, [name] who accompanied her to appointment and speaks [place]. Informed him that his mother's lab results are normal. Left this caller's name, number for questions.

## 2014-11-26 IMAGING — US US ABDOMEN COMPLETE
1 series · 14 of 25 positions shown · non-contrast
Comparison: None.

CLINICAL DATA: Abdominal pain

EXAM:
ULTRASOUND ABDOMEN COMPLETE

[Series 1: us abdomen complete · 0.27mm/px · 14 of 70 slices shown]
[im 1/70]
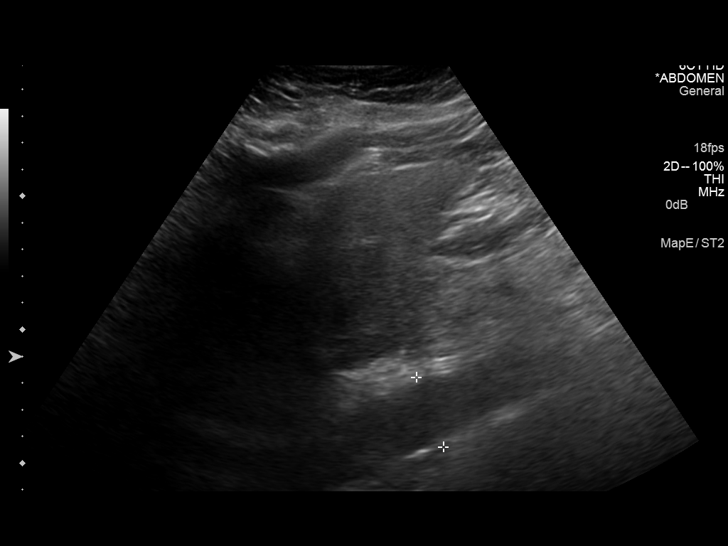
[im 6/70]
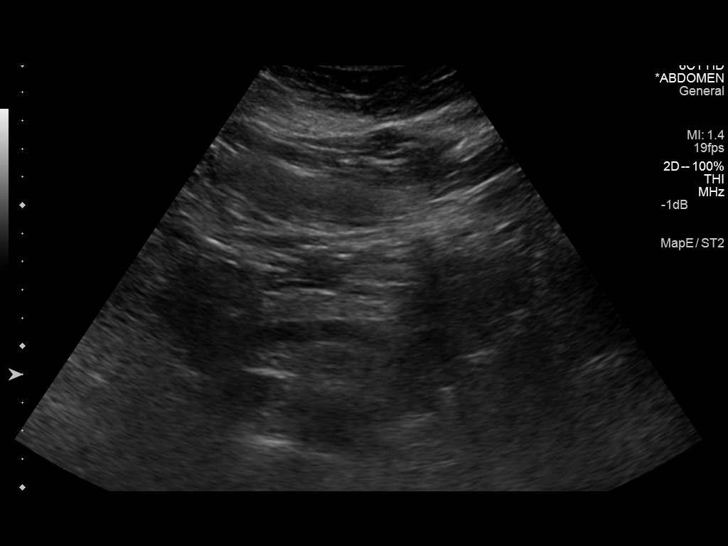
[im 12/70]
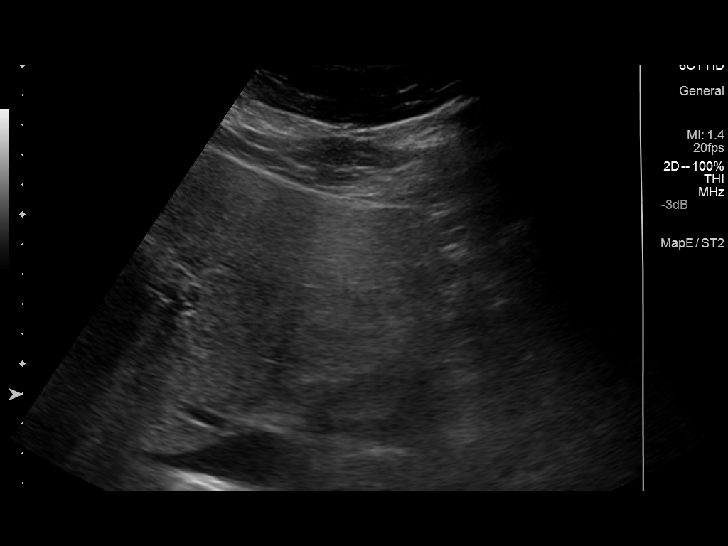
[im 18/70]
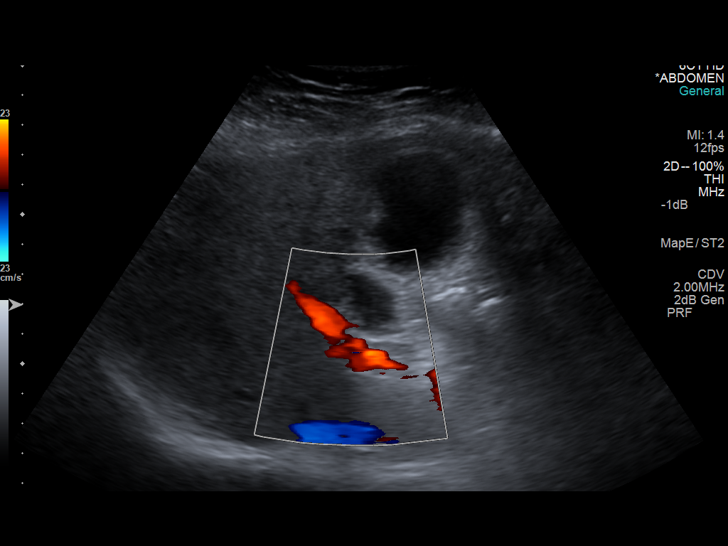
[im 24/70]
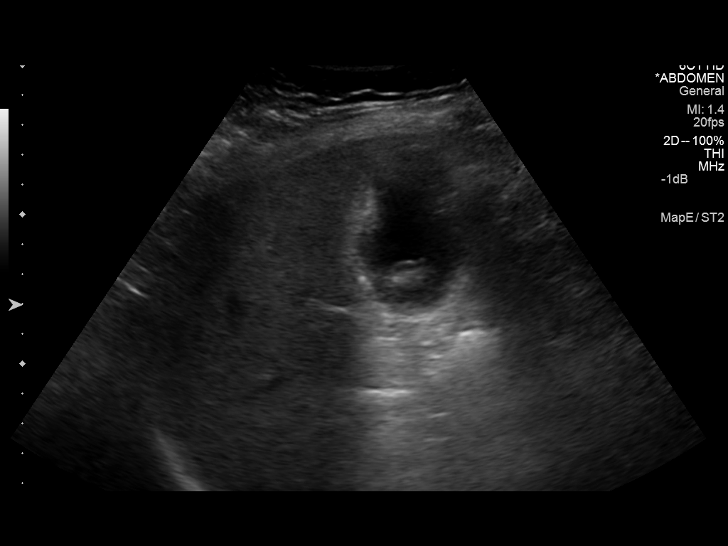
[im 26/70]
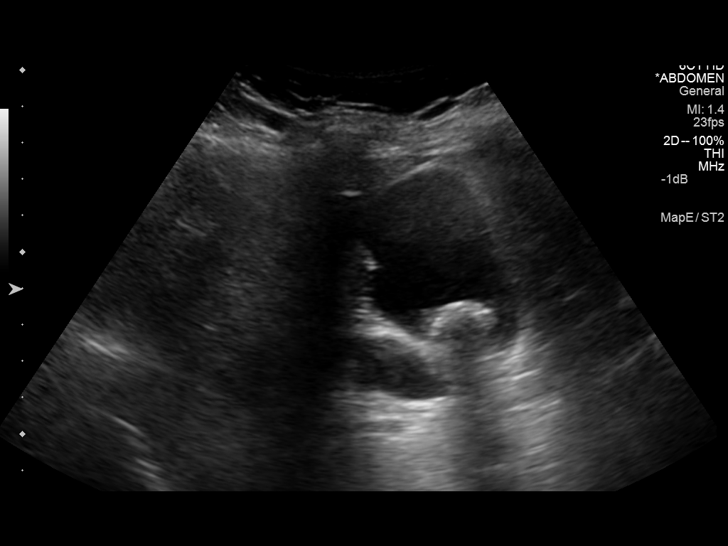
[im 32/70]
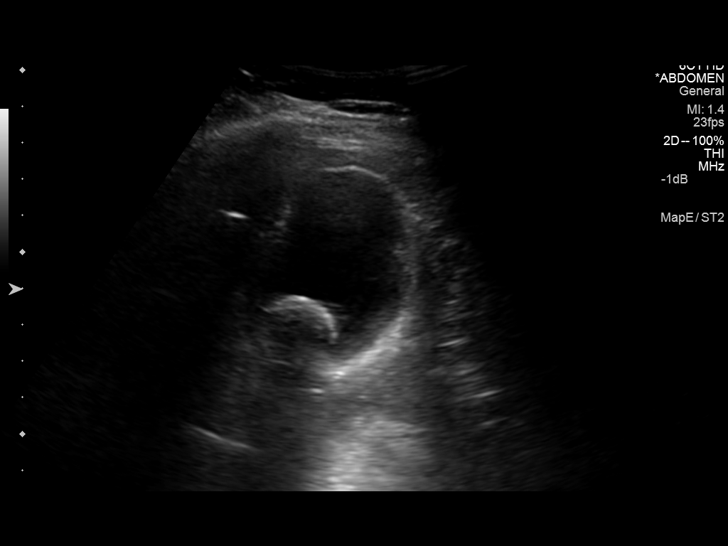
[im 38/70]
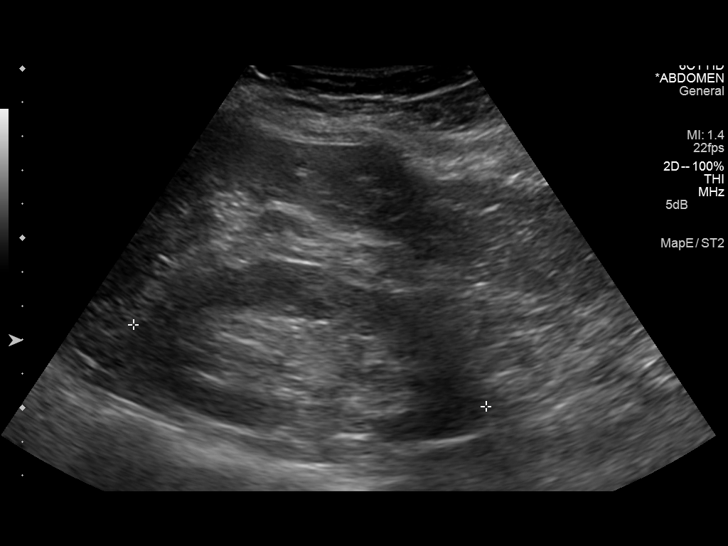
[im 44/70]
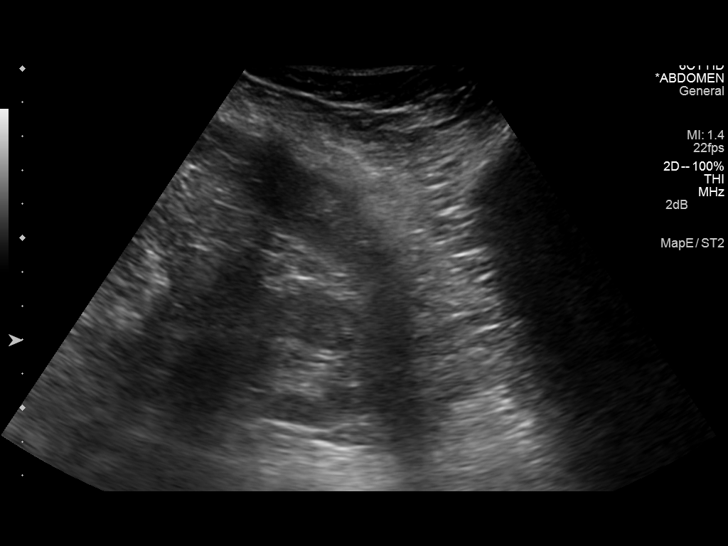
[im 47/70]
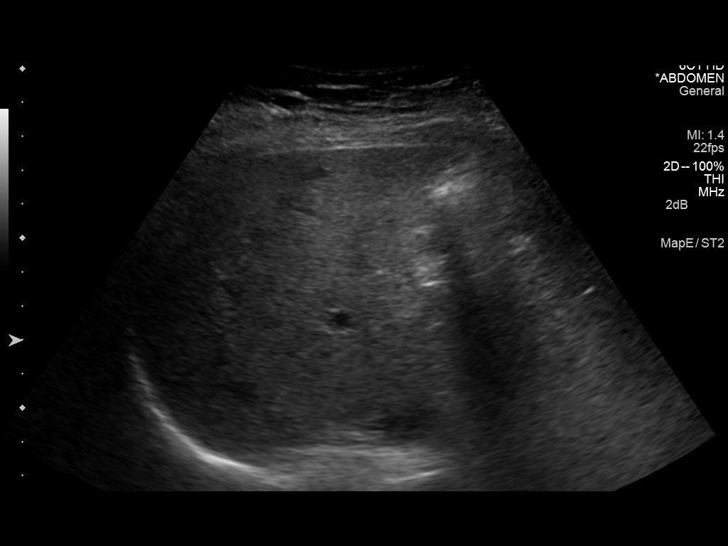
[im 52/70]
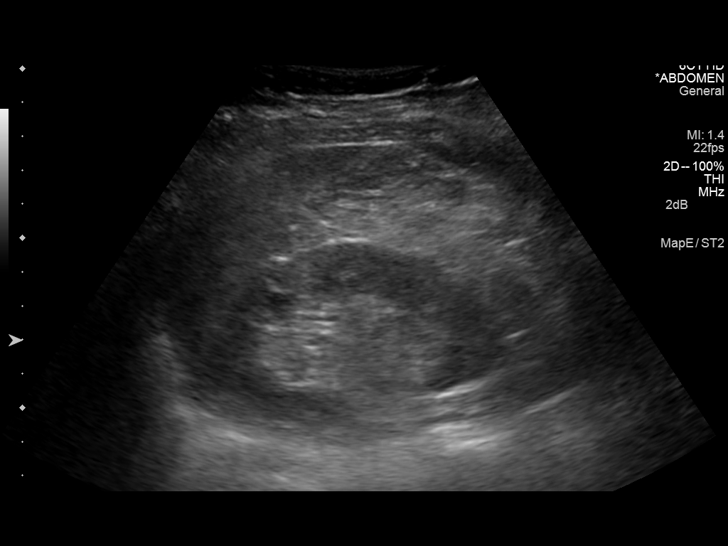
[im 58/70]
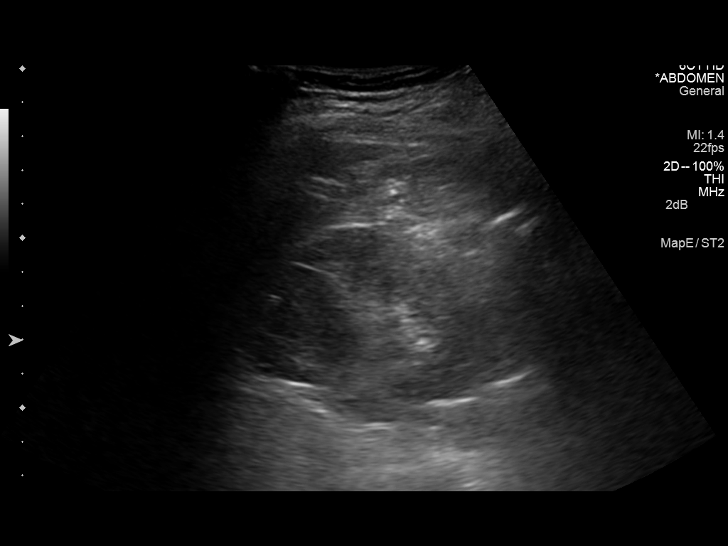
[im 64/70]
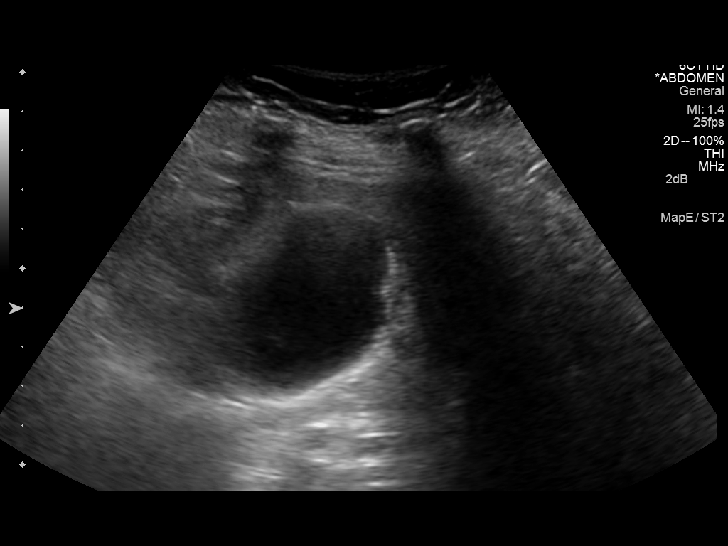
[im 70/70]
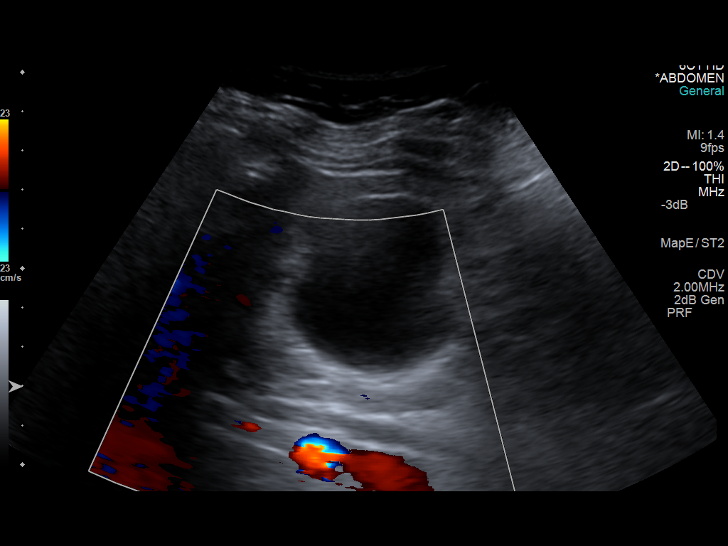

[14 of 25 positions shown; findings below may reference images not displayed]

FINDINGS: Gallbladder:

2 cm shadowing stone is identified. No gallbladder wall thickening
or pericholecystic fluid. The sonographer reports no sonographic
Murphy sign.

Common bile duct:

Diameter: Nondilated at 5-6 mm diameter.

Liver:

Coarsening of the echotexture suggests steatosis. No focal
abnormality.

IVC:

No abnormality visualized.

Pancreas:

Visualized portion unremarkable.

Spleen:

Size and appearance within normal limits.

Right Kidney:

Length: 9.4 cm. Echogenicity within normal limits. No mass or
hydronephrosis visualized.

Left Kidney:

Length: 10.7 cm. Echogenicity within normal limits. No mass or
hydronephrosis visualized.

Abdominal aorta:

No aneurysm visualized.

Other findings:

None.
IMPRESSION: Cholelithiasis without gallbladder wall thickening or sonographic
Murphy sign. No biliary dilatation.

## 2014-11-26 IMAGING — CR DG CHEST 1V PORT
1 series · 1 of 1 positions shown · non-contrast
Comparison: Chest radiographs 09/22/2012.

CLINICAL DATA: 63-year-old female with epigastric pain, right upper
quadrant pain, low back pain. Initial encounter. Query
pneumoperitoneum.

EXAM:
PORTABLE CHEST - 1 VIEW

[AP]
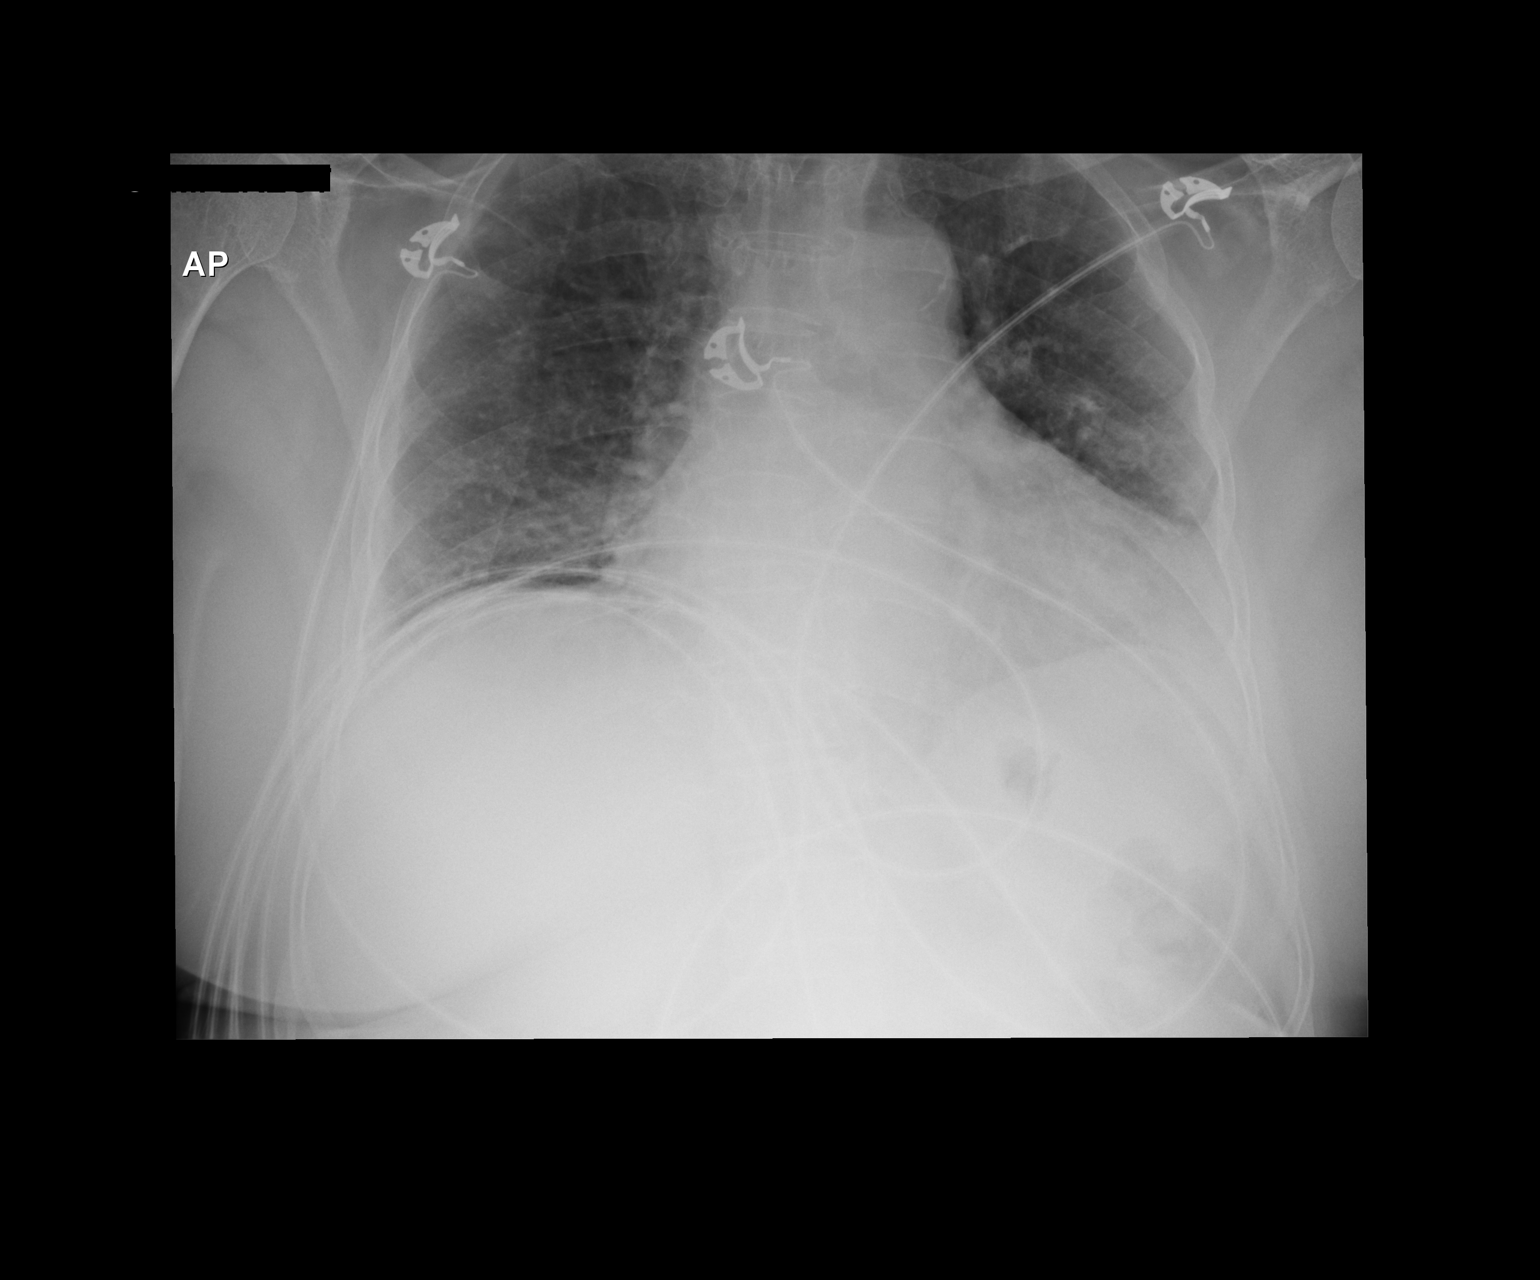

[1 of 1 positions shown; findings below may reference images not displayed]

FINDINGS: Portable AP semi upright view at at 5205 hrs. EKG leads project over
the low right hemidiaphragm. No pneumoperitoneum identified.

Lower lung volumes and lordotic view. Stable cardiac size and
mediastinal contours. Visualized tracheal air column is within
normal limits. No pneumothorax, pulmonary edema, pleural effusion or
consolidation identified.
IMPRESSION: 1. No pneumoperitoneum identified on this semi upright portable view
of the chest.
2.  No acute cardiopulmonary abnormality identified.

## 2014-11-29 ENCOUNTER — Other Ambulatory Visit: Payer: Self-pay

## 2014-11-29 DIAGNOSIS — I10 Essential (primary) hypertension: Secondary | ICD-10-CM

## 2014-11-29 MED ORDER — HYDROCHLOROTHIAZIDE 12.5 MG PO TABS: 12.5000 mg | ORAL_TABLET | Freq: Every day | ORAL | Status: DC

## 2015-02-23 ENCOUNTER — Ambulatory Visit: Payer: Medicaid Other | Admitting: Diagnostic Neuroimaging

## 2015-02-28 ENCOUNTER — Ambulatory Visit: Payer: Medicaid Other | Admitting: Diagnostic Neuroimaging

## 2015-03-04 ENCOUNTER — Ambulatory Visit (INDEPENDENT_AMBULATORY_CARE_PROVIDER_SITE_OTHER): Payer: Medicaid Other | Admitting: Diagnostic Neuroimaging

## 2015-03-04 ENCOUNTER — Encounter: Payer: Self-pay | Admitting: Diagnostic Neuroimaging

## 2015-03-04 VITALS — BP 194/87 | HR 73 | Wt 136.0 lb

## 2015-03-04 DIAGNOSIS — F03C0 Unspecified dementia, severe, without behavioral disturbance, psychotic disturbance, mood disturbance, and anxiety: Secondary | ICD-10-CM

## 2015-03-04 DIAGNOSIS — F0390 Unspecified dementia without behavioral disturbance: Secondary | ICD-10-CM | POA: Diagnosis not present

## 2015-03-04 DIAGNOSIS — R413 Other amnesia: Secondary | ICD-10-CM

## 2015-03-04 NOTE — Progress Notes (Signed)
[facility]  PATIENT: Michelle Boyd DOB: 1950-06-02  REFERRING CLINICIAN: Keck  HISTORY FROM: patient and son and friend (via interpreter) REASON FOR VISIT: follow up   HISTORICAL  CHIEF COMPLAINT:  Chief Complaint  Patient presents with  . Memory Loss    rm 7, son- Tawanna Cooler,  interpreter  Johney Frame  . Follow-up    3 month    HISTORY OF PRESENT ILLNESS:   UPDATE 03/04/15: Since last visit, symptoms stable. Now has caregiver or family supervision 24hrs per day. She stays withdrawn and very forgetful.  PRIOR HPI (11/22/14): 65 year old female here for evaluation of memory loss. Patient is originally from [place], immigrated to [place] in 2011. She is accompanied by her son, his friend and an interpreter. Patient was doing well in 2011 when she came to the [place]. She was fully independent at that time. Around 3 years ago she had onset of short-term memory problems. She was forgetting to turn off the water, turn off the stove, leaving doors open and unlocked and lights on. This has significant worsened the last 6-12 months. Sometimes she has intermittent confusion. She denies any depression or sadness. She is divorced. Her son and daughter-in-law leave for work every day and patient is alone during most the daytime. They have to help her with her medications.   REVIEW OF SYSTEMS: Full 14 system review of systems performed and notable only for swelling in legs joint pain memory loss confusion numbness weakness dizziness sleepiness restless legs not enough sleep.  ALLERGIES: Allergies  Allergen Reactions  . Feosol [Iron] Itching    HOME MEDICATIONS: Outpatient Prescriptions Prior to Visit  Medication Sig Dispense Refill  . acetaminophen (TYLENOL) 325 MG tablet Take 650 mg by mouth every 6 (six) hours as needed for mild pain or fever.    . hydrALAZINE (APRESOLINE) 25 MG tablet Take 1 tablet (25 mg total) by mouth 3 (three) times daily. 90 tablet 3  .  hydrochlorothiazide (HYDRODIURIL) 12.5 MG tablet Take 1 tablet (12.5 mg total) by mouth daily. 30 tablet 2  . loratadine (CLARITIN) 10 MG tablet Take 1 tablet (10 mg total) by mouth daily. (Patient not taking: Reported on 10/16/2014) 30 tablet 11   No facility-administered medications prior to visit.    PAST MEDICAL HISTORY: Past Medical History  Diagnosis Date  . Hypertension   . Chronic knee pain   . GERD (gastroesophageal reflux disease)   . Gallstone pancreatitis   . Cholelithiasis   . Memory loss     PAST SURGICAL HISTORY: Past Surgical History  Procedure Laterality Date  . External ear surgery  years ago    right mastoidectomy  . Cholecystectomy N/A 10/04/2013    Procedure: LAPAROSCOPIC CHOLECYSTECTOMY WITH INTRAOPERATIVE CHOLANGIOGRAM;  Surgeon: Axel Filler, MD;  Location: [facility] OR;  Service: General;  Laterality: N/A;  . Cataract extraction  2016    FAMILY HISTORY: Family History  Problem Relation Age of Onset  . Diabetes Father     SOCIAL HISTORY:  Social History   Social History  . Marital Status: Divorced    Spouse Name: N/A  . Number of Children: N/A  . Years of Education: ESL   Occupational History  .      na   Social History Main Topics  . Smoking status: Never Smoker   . Smokeless tobacco: Not on file  . Alcohol Use: No  . Drug Use: No  . Sexual Activity: No   Other Topics Concern  . Not on  file   Social History Narrative   Lives at home with son, daughter-in-law   Caffeine use- coffee 3-4 cups daily, tea occas     PHYSICAL EXAM  GENERAL EXAM/CONSTITUTIONAL: Vitals:  Filed Vitals:   03/04/15 1014  BP: 194/87  Pulse: 73  Weight: 136 lb (61.689 kg)   Body mass index is 24.87 kg/(m^2). No exam data present  Patient is in no distress; well developed, nourished and groomed; neck is supple  CARDIOVASCULAR:  Examination of carotid arteries is normal; no carotid bruits  Regular rate and rhythm, no murmurs  Examination of  peripheral vascular system by observation and palpation is normal  EYES:  Ophthalmoscopic exam of optic discs and posterior segments is normal; no papilledema or hemorrhages  MUSCULOSKELETAL:  Gait, strength, tone, movements noted in Neurologic exam below  NEUROLOGIC: MENTAL STATUS: [MMSE testing performed with interpreter; patient attempted, but gave up on most sections saying "I don't know" or "I can't do this"] MMSE - Mini Mental State Exam 03/04/2015 11/22/2014  Not completed: Unable to complete -  Orientation to time 0 0  Orientation to Place 0 0  Registration - 3  Attention/ Calculation - 0  Recall 1 0  Language- name 2 objects 1 0  Language- repeat - 0  Language- follow 3 step command - 0  Language- read & follow direction - 0  Write a sentence - 0  Copy design - 0  Total score - 3    awake, alert, oriented to person; NOT PLACE OR TIME  REMOTE MEMORY INTACT; RECENT MEMORY DECREASED  DECR ATTENTION AND CONCENTRATION  DECR FLUENCY; COMPREHENSION INTACT, naming intact  fund of knowledge appropriate  CRANIAL NERVE:   2nd - no papilledema on fundoscopic exam --> LEFT EYE CATARACT  2nd, 3rd, 4th, 6th - pupils equal and reactive to light, visual fields full to confrontation, extraocular muscles intact, no nystagmus  5th - facial sensation symmetric  7th - facial strength symmetric  8th - hearing intact  9th - palate elevates symmetrically, uvula midline  11th - shoulder shrug symmetric  12th - tongue protrusion midline  MOTOR:   normal bulk and tone, full strength in the BUE; LOWER EXT LIMITED BY PAIN (3-4)  SENSORY:   normal and symmetric to light touch; DECR VIB AND TEMP IN FEET  COORDINATION:   finger-nose-finger SLOW  REFLEXES:   deep tendon reflexes TRACE and symmetric  GAIT/STATION:   narrow based gait; SLOW UNSTEADY GAIT    DIAGNOSTIC DATA (LABS, IMAGING, TESTING) - I reviewed patient records, labs, notes, testing and imaging myself  where available.  Lab Results  Component Value Date   WBC 7.4 10/16/2014   HGB 12.4 10/16/2014   HCT 38.0 10/16/2014   MCV 83.0 10/16/2014   PLT 179 10/16/2014      Component Value Date/Time   NA 136 10/16/2014 2111   K 3.5 10/16/2014 2111   CL 104 10/16/2014 2111   CO2 23 10/16/2014 2111   GLUCOSE 101* 10/16/2014 2111   BUN 7 10/16/2014 2111   CREATININE 0.83 10/16/2014 2111   CALCIUM 9.3 10/16/2014 2111   PROT 6.3 10/25/2013 0344   ALBUMIN 2.2* 10/25/2013 0344   AST 43* 10/25/2013 0344   ALT 34 10/25/2013 0344   ALKPHOS 102 10/25/2013 0344   BILITOT 0.7 10/25/2013 0344   GFRNONAA >60 10/16/2014 2111   GFRAA >60 10/16/2014 2111   Lab Results  Component Value Date   CHOL 169 01/07/2013   HDL 38* 01/07/2013  LDLCALC 91 01/07/2013   TRIG 199* 01/07/2013   CHOLHDL 4.4 01/07/2013   Lab Results  Component Value Date   HGBA1C 5.8* 11/22/2014   Lab Results  Component Value Date   VITAMINB12 318 11/22/2014   Lab Results  Component Value Date   TSH 0.462 11/22/2014    10/16/14 CT head [I reviewed images myself and agree with interpretation. -VRP]  - No acute intracranial abnormalities. Mild diffuse atrophy and small vessel ischemic changes.    ASSESSMENT AND PLAN  65 y.o. year old female here with progressive memory loss since past 3 years, especially in last 3 months. 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   Ddx:  Memory loss - Plan: Amb Referral to Palliative Care  Severe dementia - Plan: Amb Referral to Palliative Care     PLAN: - safety and supervision issues are 1st priority - palliative care home consult for advanced care planning  - follow up with PCP re: BP mgmt  Orders Placed This Encounter  Procedures  . Amb Referral to Palliative Care   Return if symptoms worsen or fail to improve, for return to PCP.    Suanne Marker, MD 03/04/2015, 10:46 AM Certified in Neurology, Neurophysiology and Neuroimaging  [facility] [address] [place], [place] [address] [phone]

## 2015-03-11 ENCOUNTER — Ambulatory Visit: Payer: Medicaid Other | Attending: Internal Medicine | Admitting: Internal Medicine

## 2015-03-11 ENCOUNTER — Other Ambulatory Visit: Payer: Self-pay | Admitting: Internal Medicine

## 2015-03-11 ENCOUNTER — Encounter: Payer: Self-pay | Admitting: Internal Medicine

## 2015-03-11 VITALS — BP 182/95 | HR 71 | Temp 98.8°F | Resp 17 | Ht 62.0 in | Wt 136.0 lb

## 2015-03-11 DIAGNOSIS — I10 Essential (primary) hypertension: Secondary | ICD-10-CM | POA: Diagnosis not present

## 2015-03-11 DIAGNOSIS — IMO0001 Reserved for inherently not codable concepts without codable children: Secondary | ICD-10-CM

## 2015-03-11 DIAGNOSIS — R030 Elevated blood-pressure reading, without diagnosis of hypertension: Secondary | ICD-10-CM | POA: Diagnosis not present

## 2015-03-11 DIAGNOSIS — Z1239 Encounter for other screening for malignant neoplasm of breast: Secondary | ICD-10-CM | POA: Diagnosis not present

## 2015-03-11 MED ORDER — HYDRALAZINE HCL 50 MG PO TABS: 50.0000 mg | ORAL_TABLET | Freq: Three times a day (TID) | ORAL | Status: DC

## 2015-03-11 MED ORDER — CLONIDINE HCL 0.1 MG PO TABS
0.1000 mg | ORAL_TABLET | Freq: Once | ORAL | Status: AC
  Administered 2015-03-11: 0.1 mg via ORAL

## 2015-03-11 NOTE — Progress Notes (Signed)
Patient here for follow up on her HTN Presents in office with elevated blood pressure 0.1mg  catapress given per provider

## 2015-03-11 NOTE — Progress Notes (Signed)
Patient ID: Michelle Boyd, female   DOB: 03/20/1950, 65 y.o.   MRN: [id number] Subjective:  Michelle Boyd is a 65 y.o. female with hypertension and memory loss. Patient is present with family member who is helping to interpret. Patient and family adamantly reports that she takes her hydralazine 3 times per day. She does not check her pressures at home.  Current Outpatient Prescriptions  Medication Sig Dispense Refill  . hydrALAZINE (APRESOLINE) 25 MG tablet Take 1 tablet (25 mg total) by mouth 3 (three) times daily. 90 tablet 3  . acetaminophen (TYLENOL) 325 MG tablet Take 650 mg by mouth every 6 (six) hours as needed for mild pain or fever.    . hydrochlorothiazide (HYDRODIURIL) 12.5 MG tablet Take 1 tablet (12.5 mg total) by mouth daily. 30 tablet 2  . [DISCONTINUED] chlorthalidone (HYGROTON) 25 MG tablet Take 25 mg by mouth daily.    . [DISCONTINUED] lisinopril (PRINIVIL,ZESTRIL) 10 MG tablet Take 10 mg by mouth daily.     No current facility-administered medications for this visit.    Hypertension ROS: taking medications as instructed, no medication side effects noted, no TIA's, no chest pain on exertion, no dyspnea on exertion, no swelling of ankles and no palpitations.   Objective:  BP 182/95 mmHg  Pulse 71  Temp(Src) 98.8 F (37.1 C)  Resp 17  Ht  (1.575 m)  Wt 136 lb (61.689 kg)  BMI 24.87 kg/m2  SpO2 100%  Appearance alert, well appearing, and in no distress, oriented to person, place, and time and normal appearing weight. General exam BP noted to be severely elevated today in office, S1, S2 normal, no gallop, no murmur, chest clear, no JVD, no HSM, no edema.  Lab review: Patient has ate. She will come back for labs   Assessment:   [name] was seen today for follow-up.  Diagnoses and all orders for this visit:  Elevated blood pressure -     cloNIDine (CATAPRES) tablet 0.1 mg; Take 1 tablet (0.1 mg total) by mouth once.  Essential hypertension -     Basic Metabolic Panel;  Future -     Lipid panel; Future -     hydrALAZINE (APRESOLINE) 50 MG tablet; Take 1 tablet (50 mg total) by mouth 3 (three) times daily. I have increased her Hydralazine 50 mg due to elevated BP. I will have her come back in 1 week so that I can check her pressures.  Breast cancer screening -     MM Digital Screening; Future   Return in about 1 week (around 03/18/2015) for Lab Visit and Stacy-BP check and 3 mo PCP HTN.   [name], NP 03/11/2015 2:05 PM

## 2015-03-16 ENCOUNTER — Ambulatory Visit: Payer: Medicaid Other | Admitting: Diagnostic Neuroimaging

## 2015-03-17 ENCOUNTER — Ambulatory Visit: Payer: Medicaid Other | Attending: Internal Medicine | Admitting: Pharmacist

## 2015-03-17 ENCOUNTER — Other Ambulatory Visit: Payer: Self-pay | Admitting: Internal Medicine

## 2015-03-17 VITALS — BP 190/91 | HR 60

## 2015-03-17 DIAGNOSIS — Z79899 Other long term (current) drug therapy: Secondary | ICD-10-CM | POA: Insufficient documentation

## 2015-03-17 DIAGNOSIS — I10 Essential (primary) hypertension: Secondary | ICD-10-CM | POA: Diagnosis not present

## 2015-03-17 MED ORDER — AMLODIPINE BESYLATE 5 MG PO TABS: 5.0000 mg | ORAL_TABLET | Freq: Every day | ORAL | Status: DC

## 2015-03-17 MED ORDER — CLONIDINE HCL 0.1 MG PO TABS
0.1000 mg | ORAL_TABLET | Freq: Once | ORAL | Status: AC
  Administered 2015-03-17: 0.1 mg via ORAL

## 2015-03-17 NOTE — Progress Notes (Signed)
S:    Patient arrives in good spirits with her son who translates for her.    Presents to the clinic for hypertension evaluation.   Patient reports adherence with medications. She took her last dose of hydralazine at 8 AM and her next dose is with lunch and the last dose with be with dinner.   Current BP Medications include:  Hydralazine 50 mg TID  Antihypertensives tried in the past include: HCTZ (electrolyte imbalance), lisinopril (AKI)   O:   Last 3 Office BP readings: BP Readings from Last 3 Encounters:  03/17/15 181/84  03/11/15 182/95  03/04/15 194/87    BMET    Component Value Date/Time   NA 136 10/16/2014 2111   K 3.5 10/16/2014 2111   CL 104 10/16/2014 2111   CO2 23 10/16/2014 2111   GLUCOSE 101* 10/16/2014 2111   BUN 7 10/16/2014 2111   CREATININE 0.83 10/16/2014 2111   CALCIUM 9.3 10/16/2014 2111   GFRNONAA >60 10/16/2014 2111   GFRAA >60 10/16/2014 2111    A/P: History of hypertension currently UNcontrolled on current medications.  She is adamant that she takes the hydralazine TID and the last dose was this morning about 8 AM. She has been on HCTZ and lisinopril in the past so will initiate amlodipine 5 mg daily since increasing the hydralazine did not improve her pressure last time. Reviewed the medication, including adverse effects. Patient and son verbalized understanding.   Administered clonidine 0.1 mg x 1 in clinic - though blood pressure is elevated, patient is >62 yo and pulse was already at 65. Blood pressure actually worsened but patient admitted to stress from being her and having elevated blood pressure. Patient and son could not wait any longer for a blood pressure recheck so instructed patient to get the amlodipine and start it today.   Patient refused influenza vaccination.   Results reviewed and written information provided. Total time in face-to-face counseling 35 minutes.  F/U Clinic Visit with me in 1-2 weeks.

## 2015-03-17 NOTE — Patient Instructions (Signed)
Continue hydralazine for your blood pressure  Start amlodipine for your blood pressure as well  Come back and see me in 2 weeks for a blood pressure check

## 2015-03-18 ENCOUNTER — Other Ambulatory Visit: Payer: Self-pay | Admitting: Internal Medicine

## 2015-03-18 ENCOUNTER — Other Ambulatory Visit: Payer: Medicaid Other

## 2015-03-18 DIAGNOSIS — Z1231 Encounter for screening mammogram for malignant neoplasm of breast: Secondary | ICD-10-CM

## 2015-03-31 ENCOUNTER — Ambulatory Visit: Payer: Medicaid Other | Attending: Internal Medicine | Admitting: Pharmacist

## 2015-03-31 VITALS — BP 138/81 | HR 79

## 2015-03-31 DIAGNOSIS — Z79899 Other long term (current) drug therapy: Secondary | ICD-10-CM | POA: Diagnosis not present

## 2015-03-31 DIAGNOSIS — I10 Essential (primary) hypertension: Secondary | ICD-10-CM | POA: Diagnosis not present

## 2015-03-31 NOTE — Progress Notes (Signed)
S:    Patient arrives in good spirits with her son.    Presents to the clinic for hypertension evaluation.  Patient reports adherence with medications.  Current BP Medications include:  Amlodipine 5 mg daily and hydralazine 50 mg TID.  Patient denies any side effects or issues with the medications.  O:   Last 3 Office BP readings: BP Readings from Last 3 Encounters:  03/31/15 138/81  03/17/15 190/91  03/11/15 182/95    BMET    Component Value Date/Time   NA 136 10/16/2014 2111   K 3.5 10/16/2014 2111   CL 104 10/16/2014 2111   CO2 23 10/16/2014 2111   GLUCOSE 101* 10/16/2014 2111   BUN 7 10/16/2014 2111   CREATININE 0.83 10/16/2014 2111   CALCIUM 9.3 10/16/2014 2111   GFRNONAA >60 10/16/2014 2111   GFRAA >60 10/16/2014 2111    A/P: Hypertension currently controlled on current medications.  Continued amlodipine 5 mg daily and hydralazine 50 mg TID. No refills needed at this time.   Results reviewed and written information provided.   Total time in face-to-face counseling 10 minutes.   F/U Clinic Visit with Holland Commons, NP as directed.

## 2015-03-31 NOTE — Patient Instructions (Signed)
Thanks for coming to see me!  No changes.  Follow up with Holland Commons

## 2015-05-17 ENCOUNTER — Other Ambulatory Visit: Payer: Self-pay | Admitting: Internal Medicine

## 2015-05-18 ENCOUNTER — Other Ambulatory Visit: Payer: Self-pay | Admitting: Internal Medicine

## 2015-05-19 ENCOUNTER — Other Ambulatory Visit: Payer: Self-pay | Admitting: Internal Medicine

## 2015-05-27 ENCOUNTER — Encounter: Payer: Self-pay | Admitting: Internal Medicine

## 2015-05-27 ENCOUNTER — Ambulatory Visit: Payer: Medicaid Other | Attending: Internal Medicine | Admitting: Internal Medicine

## 2015-05-27 VITALS — BP 138/70 | HR 72 | Temp 97.8°F | Resp 16 | Ht 62.0 in | Wt 131.8 lb

## 2015-05-27 DIAGNOSIS — Z79899 Other long term (current) drug therapy: Secondary | ICD-10-CM | POA: Insufficient documentation

## 2015-05-27 DIAGNOSIS — I10 Essential (primary) hypertension: Secondary | ICD-10-CM | POA: Diagnosis not present

## 2015-05-27 MED ORDER — AMLODIPINE BESYLATE 5 MG PO TABS: 5.0000 mg | ORAL_TABLET | Freq: Every day | ORAL | Status: DC

## 2015-05-27 MED ORDER — HYDRALAZINE HCL 50 MG PO TABS: 50.0000 mg | ORAL_TABLET | Freq: Three times a day (TID) | ORAL | Status: DC

## 2015-05-27 NOTE — Patient Instructions (Signed)
Come back in one week fasting for blood test.

## 2015-05-27 NOTE — Progress Notes (Signed)
Patient's here for 71month f/up BP.   Patent has no further concerns.

## 2015-05-27 NOTE — Progress Notes (Signed)
Patient ID: Michelle Boyd, female   DOB: 10/03/1950, 65 y.o.   MRN: [id number] Subjective:  Michelle Boyd is a 65 y.o. female with hypertension. States that she usually takes her hydralazine at 7am, 12 noon, and 7 pm. Before coming in the building she just took Amlodipine and her second dose of Hydralazine. Current Outpatient Prescriptions  Medication Sig Dispense Refill  . acetaminophen (TYLENOL) 325 MG tablet Take 650 mg by mouth every 6 (six) hours as needed for mild pain or fever.    Marland Kitchen. amLODipine (NORVASC) 5 MG tablet TAKE 1 TABLET BY MOUTH DAILY 90 tablet 0  . hydrALAZINE (APRESOLINE) 50 MG tablet TAKE 1 TABLET BY MOUTH THREE TIMES DAILY 90 tablet 2  . [DISCONTINUED] chlorthalidone (HYGROTON) 25 MG tablet Take 25 mg by mouth daily.    . [DISCONTINUED] lisinopril (PRINIVIL,ZESTRIL) 10 MG tablet Take 10 mg by mouth daily.     No current facility-administered medications for this visit.    Hypertension ROS: taking medications as instructed, no medication side effects noted, no TIA's, no chest pain on exertion and no dyspnea on exertion.   Objective:  BP 138/70 mmHg  Pulse 72  Temp(Src) 97.8 F (36.6 C) (Oral)  Resp 16  Ht 5\' 2"  (1.575 m)  Wt 131 lb 12.8 oz (59.784 kg)  BMI 24.10 kg/m2  SpO2 99%  Appearance alert, well appearing, and in no distress, oriented to person, place, and time and normal appearing weight. General exam BP noted to be well controlled today in office, S1, S2 normal, no gallop, no murmur, chest clear, no JVD, no HSM, no edema.  Lab review: orders written for new lab studies as appropriate; see orders.   Assessment:   Michelle Boyd was seen today for follow-up and hypertension.  Diagnoses and all orders for this visit:  Essential hypertension -     amLODipine (NORVASC) 5 MG tablet; Take 1 tablet (5 mg total) by mouth daily. -     hydrALAZINE (APRESOLINE) 50 MG tablet; Take 1 tablet (50 mg total) by mouth 3 (three) times daily. Patient blood pressure is stable and may continue  on current medication.  Education on diet, exercise, and modifiable risk factors discussed. Will obtain appropriate labs as needed. Will follow up in 3-6 months.    Plan:  Current treatment plan is effective, no change in therapy..  Interpreter was used to communicate directly with patient for the entire encounter including providing detailed patient instructions.    Return in about 1 week (around 06/03/2015) for lab visit and 3 mo PCP .  Ambrose FinlandValerie A Olon Russ, NP 06/03/2015 8:03 AM

## 2015-06-06 ENCOUNTER — Ambulatory Visit: Payer: Medicaid Other | Attending: Internal Medicine

## 2015-06-06 DIAGNOSIS — I10 Essential (primary) hypertension: Secondary | ICD-10-CM | POA: Insufficient documentation

## 2015-06-06 LAB — LIPID PANEL
Cholesterol: 183 mg/dL (ref 125–200)
HDL: 53 mg/dL (ref >=46)
LDL CALC: 104 mg/dL (ref <130)
Total CHOL/HDL Ratio: 3.5 Ratio (ref <=5.0)
Triglycerides: 131 mg/dL (ref <150)
VLDL: 26 mg/dL (ref <30)

## 2015-06-06 LAB — BASIC METABOLIC PANEL
BUN: 11 mg/dL (ref 7–25)
CHLORIDE: 102 mmol/L (ref 98–110)
CO2: 24 mmol/L (ref 20–31)
CREATININE: 0.79 mg/dL (ref 0.50–0.99)
Calcium: 9.3 mg/dL (ref 8.6–10.4)
GLUCOSE: 87 mg/dL (ref 65–99)
Potassium: 3.7 mmol/L (ref 3.5–5.3)
Sodium: 138 mmol/L (ref 135–146)

## 2015-06-06 NOTE — Patient Instructions (Signed)
Patient is aware of receiving a FU for results. 

## 2015-06-09 ENCOUNTER — Telehealth: Payer: Self-pay | Admitting: Physician Assistant

## 2015-06-09 NOTE — Telephone Encounter (Signed)
Spoke with patient and gave her normal lab results

## 2015-07-19 ENCOUNTER — Other Ambulatory Visit: Payer: Self-pay | Admitting: Internal Medicine

## 2015-09-05 ENCOUNTER — Encounter (HOSPITAL_COMMUNITY): Payer: Self-pay | Admitting: *Deleted

## 2015-09-05 ENCOUNTER — Emergency Department (HOSPITAL_COMMUNITY)
Admission: EM | Admit: 2015-09-05 | Discharge: 2015-09-05 | Disposition: A | Discharge status: Home / Self Care | Payer: Medicaid Other | Attending: Emergency Medicine | Admitting: Emergency Medicine

## 2015-09-05 ENCOUNTER — Telehealth: Payer: Self-pay | Admitting: Internal Medicine

## 2015-09-05 DIAGNOSIS — I10 Essential (primary) hypertension: Secondary | ICD-10-CM | POA: Insufficient documentation

## 2015-09-05 DIAGNOSIS — J0100 Acute maxillary sinusitis, unspecified: Secondary | ICD-10-CM | POA: Diagnosis not present

## 2015-09-05 DIAGNOSIS — J029 Acute pharyngitis, unspecified: Secondary | ICD-10-CM | POA: Diagnosis present

## 2015-09-05 LAB — RAPID STREP SCREEN (MED CTR MEBANE ONLY): Streptococcus, Group A Screen (Direct): NEGATIVE

## 2015-09-05 MED ORDER — AMLODIPINE BESYLATE 5 MG PO TABS: 5.0000 mg | ORAL_TABLET | Freq: Every day | ORAL | Status: DC

## 2015-09-05 MED ORDER — AMOXICILLIN-POT CLAVULANATE 875-125 MG PO TABS: 1.0000 | ORAL_TABLET | Freq: Two times a day (BID) | ORAL | Status: DC

## 2015-09-05 NOTE — ED Notes (Signed)
Pt here for sore and itching throat throat, headache, cough and fevers x 5 days.  Throat is red with white patches. VS stable.

## 2015-09-05 NOTE — ED Notes (Signed)
Declined W/C at D/C and was escorted to lobby by RN. 

## 2015-09-05 NOTE — Telephone Encounter (Signed)
Medication refilled - patient needs office visit

## 2015-09-05 NOTE — ED Provider Notes (Signed)
History  By signing my name below, I, Michelle Boyd, attest that this documentation has been prepared under the direction and in the presence of [employer], PA-C. Electronically Signed: Earmon Boyd, ED Scribe. 09/05/2015. 12:56 PM.  Chief Complaint  Patient presents with  . Sore Throat   The history is provided by the patient and medical records. No language interpreter was used.    HPI Comments:  Michelle Boyd is a 65 y.o. female who presents to the Emergency Department complaining of worsening sore throat that began 5 days ago. She reports associated sinus pain, congestion, rhinorrhea, otalgia, itching of the throat, cough and HA. She reports subjective fever at night. She has not taken anything for symptoms. She denies modifying factors. She denies difficulty breathing or swallowing.  Past Medical History  Diagnosis Date  . Hypertension   . Chronic knee pain   . GERD (gastroesophageal reflux disease)   . Gallstone pancreatitis   . Cholelithiasis   . Memory loss    Past Surgical History  Procedure Laterality Date  . External ear surgery  years ago    right mastoidectomy  . Cholecystectomy N/A 10/04/2013    Procedure: LAPAROSCOPIC CHOLECYSTECTOMY WITH INTRAOPERATIVE CHOLANGIOGRAM;  Surgeon: Axel Filler, MD;  Location: [facility] OR;  Service: General;  Laterality: N/A;  . Cataract extraction  2016   Family History  Problem Relation Age of Onset  . Diabetes Father    Social History  Substance Use Topics  . Smoking status: Never Smoker   . Smokeless tobacco: None  . Alcohol Use: No   OB History    No data available     Review of Systems A complete 10 system review of systems was obtained and all systems are negative except as noted in the HPI and PMH.   Allergies  Feosol  Home Medications   Prior to Admission medications   Medication Sig Start Date End Date Taking? Authorizing Provider  acetaminophen (TYLENOL) 325 MG tablet Take 650 mg by mouth every 6  (six) hours as needed for mild pain or fever.    Historical Provider, MD  amLODipine (NORVASC) 5 MG tablet Take 1 tablet (5 mg total) by mouth daily. 05/27/15   [name], NP  hydrALAZINE (APRESOLINE) 50 MG tablet Take 1 tablet (50 mg total) by mouth 3 (three) times daily. 05/27/15   [name], NP   Triage Vitals: BP 173/90 mmHg  Pulse 64  Temp(Src) 98 F (36.7 C) (Oral)  Resp 20  SpO2 100% Physical Exam  Constitutional: She is oriented to person, place, and time. She appears well-developed and well-nourished.  HENT:  Head: Normocephalic and atraumatic.  Right Ear: Tympanic membrane and ear canal normal.  Left Ear: Tympanic membrane and ear canal normal.  Nose: Right sinus exhibits maxillary sinus tenderness. Left sinus exhibits maxillary sinus tenderness.  Mouth/Throat: Uvula is midline. No trismus in the jaw. Posterior oropharyngeal erythema (mild) present. No posterior oropharyngeal edema.  Tonsillolith on left tonsils. Cerumen partially obstructing right TM but no obvious edema or erythema.  Eyes: EOM are normal.  Neck: Normal range of motion. Neck supple.  Cardiovascular: Normal rate, regular rhythm and normal heart sounds.  Exam reveals no gallop and no friction rub.   No murmur heard. Pulmonary/Chest: Effort normal and breath sounds normal. No respiratory distress. She has no wheezes. She has no rales.  Musculoskeletal: Normal range of motion.  Lymphadenopathy:    She has no cervical adenopathy.  Neurological: She is alert and oriented to  person, place, and time.  Skin: Skin is warm and dry.  Psychiatric: She has a normal mood and affect. Her behavior is normal.  Nursing note and vitals reviewed.   ED Course  Procedures (including critical care time) DIAGNOSTIC STUDIES: Oxygen Saturation is 100% on RA, normal by my interpretation.   COORDINATION OF CARE: 12:54 PM- Will prescribe antibiotic. Pt verbalizes understanding and agrees to plan.  Medications - No data to  display  Labs Review Labs Reviewed  RAPID STREP SCREEN ([facility])  CULTURE, GROUP A STREP [facility])    Imaging Review No results found. I have personally reviewed and evaluated these images and lab results as part of my medical decision-making.   EKG Interpretation None      MDM   Final diagnoses:  None    Pt symptoms consistent with sinus infection. Pt will be discharged with symptomatic treatment and antibiotic.  Patient also complaining of sore throat.  Rapid strep is negative.  She has what appears to be Tonsillolith on the left side.  No signs of PTA or Retropharyngeal Abscess.  Discussed return precautions.  Pt is hemodynamically stable & in NAD prior to discharge.  I personally performed the services described in this documentation, which was scribed in my presence. The recorded information has been reviewed and is accurate.    Santiago GladHeather Dmario Russom, PA-C 09/05/15 1657   Arby BarretteMarcy Pfeiffer, MD 09/14/15 [phone]

## 2015-09-05 NOTE — Telephone Encounter (Signed)
Pt. Came into facility requesting a refill on amLODipine (NORVASC) 5 MG tablet. Please f/u with pt.

## 2015-09-07 LAB — CULTURE, GROUP A STREP (THRC)

## 2015-09-13 NOTE — Progress Notes (Deleted)
   Subjective:    Patient ID: Michelle Boyd, female    DOB: 11/30/1950, 65 y.o.   MRN: [id number]  HPI Michelle Boyd is a 65 y.o. female who presents to the Emergency Department complaining of worsening sore throat that began 5 days ago. She reports associated sinus pain, congestion, rhinorrhea, otalgia, itching of the throat, cough and HA. She reports subjective fever at night. She has not taken anything for symptoms. She denies modifying factors. She denies difficulty breathing or swallowing.  D/c on augmentin po  Seen in ED 7/17  Review of Systems     Objective:   Physical Exam There were no vitals filed for this visit.  Gen: Pleasant, well-nourished, in no distress,  normal affect  ENT: No lesions,  mouth clear,  oropharynx clear, no postnasal drip  Neck: No JVD, no TMG, no carotid bruits  Lungs: No use of accessory muscles, no dullness to percussion, clear without rales or rhonchi  Cardiovascular: RRR, heart sounds normal, no murmur or gallops, no peripheral edema  Abdomen: soft and NT, no HSM,  BS normal  Musculoskeletal: No deformities, no cyanosis or clubbing  Neuro: alert, non focal  Skin: Warm, no lesions or rashes  No results found.        Assessment & Plan:

## 2015-09-14 ENCOUNTER — Inpatient Hospital Stay: Payer: Medicaid Other | Admitting: Critical Care Medicine

## 2015-10-03 ENCOUNTER — Ambulatory Visit: Payer: Medicaid Other | Attending: Family Medicine | Admitting: Family Medicine

## 2015-10-03 ENCOUNTER — Other Ambulatory Visit: Payer: Self-pay | Admitting: Family Medicine

## 2015-10-03 ENCOUNTER — Encounter: Payer: Self-pay | Admitting: Family Medicine

## 2015-10-03 VITALS — BP 163/89 | HR 64 | Temp 97.4°F | Ht 59.0 in | Wt 131.2 lb

## 2015-10-03 DIAGNOSIS — M791 Myalgia, unspecified site: Secondary | ICD-10-CM

## 2015-10-03 DIAGNOSIS — I10 Essential (primary) hypertension: Secondary | ICD-10-CM | POA: Diagnosis not present

## 2015-10-03 MED ORDER — HYDRALAZINE HCL 50 MG PO TABS: 50.0000 mg | ORAL_TABLET | Freq: Three times a day (TID) | ORAL | 2 refills | Status: DC

## 2015-10-03 MED ORDER — NAPROXEN 500 MG PO TABS: 500.0000 mg | ORAL_TABLET | Freq: Two times a day (BID) | ORAL | 0 refills | Status: DC

## 2015-10-03 NOTE — Progress Notes (Signed)
bp check Pain in upper arm area

## 2015-10-03 NOTE — Progress Notes (Signed)
Subjective:  Patient ID: Michelle Boyd, female    DOB: 09/11/1950  Age: 65 y.o. MRN: [id number]  CC: Hypertension   HPI Michelle Boyd is a 65 year old female with a history of hypertension who is accompanied by her son today and is seen with the aid of the video interpreter. Her blood pressure is elevated and she has been taking only amlodipine and has run out of hydralazine.  Complains of myalgias which are worse in the morning especially in her upper back, left upper arm and abdomen and pain has been on for the last 2 weeks. Of note she was treated for sinusitis at the ED a month ago. Denies sinus symptoms, URI symptoms or fever.  The son would like a citizenship form completed for them.  Past Medical History:  Diagnosis Date  . Cholelithiasis   . Chronic knee pain   . Gallstone pancreatitis   . GERD (gastroesophageal reflux disease)   . Hypertension   . Memory loss     Past Surgical History:  Procedure Laterality Date  . CATARACT EXTRACTION  2016  . CHOLECYSTECTOMY N/A 10/04/2013   Procedure: LAPAROSCOPIC CHOLECYSTECTOMY WITH INTRAOPERATIVE CHOLANGIOGRAM;  Surgeon: Axel FillerArmando Ramirez, MD;  Location: [facility] OR;  Service: General;  Laterality: N/A;  . EXTERNAL EAR SURGERY  years ago   right mastoidectomy    Allergies  Allergen Reactions  . Feosol [Iron] Itching     Outpatient Medications Prior to Visit  Medication Sig Dispense Refill  . amLODipine (NORVASC) 5 MG tablet Take 1 tablet (5 mg total) by mouth daily. 90 tablet 0  . acetaminophen (TYLENOL) 325 MG tablet Take 650 mg by mouth every 6 (six) hours as needed for mild pain or fever.    Marland Kitchen. amoxicillin-clavulanate (AUGMENTIN) 875-125 MG tablet Take 1 tablet by mouth 2 (two) times daily. (Patient not taking: Reported on 10/03/2015) 10 tablet 0  . hydrALAZINE (APRESOLINE) 50 MG tablet Take 1 tablet (50 mg total) by mouth 3 (three) times daily. 90 tablet 2   No facility-administered medications prior to visit.     ROS Review of  Systems  Constitutional: Negative for activity change and appetite change.  HENT: Negative for sinus pressure and sore throat.   Respiratory: Negative for chest tightness, shortness of breath and wheezing.   Cardiovascular: Negative for chest pain and palpitations.  Gastrointestinal: Negative for abdominal distention, abdominal pain and constipation.  Genitourinary: Negative.   Musculoskeletal: Positive for myalgias.  Psychiatric/Behavioral: Negative for behavioral problems and dysphoric mood.    Objective:  BP (!) 163/89 (BP Location: Left Arm, Patient Position: Sitting, Cuff Size: Small)   Pulse 64   Temp 97.4 F (36.3 C) (Oral)   Ht 4\' 11"  (1.499 m)   Wt 131 lb 3.2 oz (59.5 kg)   SpO2 99%   BMI 26.50 kg/m   BP/Weight 10/03/2015 09/05/2015 05/27/2015  Systolic BP 163 173 138  Diastolic BP 89 90 70  Wt. (Lbs) 131.2 - 131.8  BMI 26.5 - 24.1      Physical Exam  Constitutional: She is oriented to person, place, and time. She appears well-developed and well-nourished.  Cardiovascular: Normal rate, normal heart sounds and intact distal pulses.   No murmur heard. Pulmonary/Chest: Effort normal and breath sounds normal. She has no wheezes. She has no rales. She exhibits no tenderness.  Abdominal: Soft. Bowel sounds are normal. She exhibits no distension and no mass. There is no tenderness.  Musculoskeletal: Normal range of motion.  Neurological: She is alert and oriented to  person, place, and time.     Assessment & Plan:   1. Essential hypertension Uncontrolled due to running out of hydralazine Low-sodium, DASH diet We'll reassess at next visit - hydrALAZINE (APRESOLINE) 50 MG tablet; Take 1 tablet (50 mg total) by mouth 3 (three) times daily.  Dispense: 90 tablet; Refill: 2  2. Myalgia And naproxen.  I have advised both of them that the form will be completed at her next visit.   Meds ordered this encounter  Medications  . hydrALAZINE (APRESOLINE) 50 MG tablet     Sig: Take 1 tablet (50 mg total) by mouth 3 (three) times daily.    Dispense:  90 tablet    Refill:  2  . naproxen (NAPROSYN) 500 MG tablet    Sig: Take 1 tablet (500 mg total) by mouth 2 (two) times daily with a meal.    Dispense:  30 tablet    Refill:  0    Follow-up: Return in about 2 weeks (around 10/17/2015) for follow up on Hypertension.   Jaclyn ShaggyEnobong Amao MD

## 2015-10-05 ENCOUNTER — Emergency Department (HOSPITAL_COMMUNITY): Payer: Medicaid Other

## 2015-10-05 ENCOUNTER — Emergency Department (HOSPITAL_COMMUNITY)
Admission: EM | Admit: 2015-10-05 | Discharge: 2015-10-05 | Disposition: A | Discharge status: Home / Self Care | Payer: Medicaid Other | Attending: Emergency Medicine | Admitting: Emergency Medicine

## 2015-10-05 ENCOUNTER — Encounter (HOSPITAL_COMMUNITY): Payer: Self-pay | Admitting: *Deleted

## 2015-10-05 DIAGNOSIS — Z7982 Long term (current) use of aspirin: Secondary | ICD-10-CM | POA: Insufficient documentation

## 2015-10-05 DIAGNOSIS — R002 Palpitations: Secondary | ICD-10-CM | POA: Diagnosis not present

## 2015-10-05 DIAGNOSIS — G44209 Tension-type headache, unspecified, not intractable: Secondary | ICD-10-CM

## 2015-10-05 DIAGNOSIS — I10 Essential (primary) hypertension: Secondary | ICD-10-CM | POA: Diagnosis not present

## 2015-10-05 DIAGNOSIS — G44229 Chronic tension-type headache, not intractable: Secondary | ICD-10-CM | POA: Insufficient documentation

## 2015-10-05 DIAGNOSIS — R079 Chest pain, unspecified: Secondary | ICD-10-CM | POA: Diagnosis present

## 2015-10-05 LAB — CBC
HCT: 39.4 % (ref 36.0–46.0)
HEMOGLOBIN: 12.7 g/dL (ref 12.0–15.0)
MCH: 27.7 pg (ref 26.0–34.0)
MCHC: 32.2 g/dL (ref 30.0–36.0)
MCV: 85.8 fL (ref 78.0–100.0)
Platelets: 198 10*3/uL (ref 150–400)
RBC: 4.59 MIL/uL (ref 3.87–5.11)
RDW: 14.6 % (ref 11.5–15.5)
WBC: 7.3 10*3/uL (ref 4.0–10.5)

## 2015-10-05 LAB — BASIC METABOLIC PANEL
ANION GAP: 9 (ref 5–15)
BUN: 8 mg/dL (ref 6–20)
CALCIUM: 9.6 mg/dL (ref 8.9–10.3)
CO2: 24 mmol/L (ref 22–32)
Chloride: 108 mmol/L (ref 101–111)
Creatinine, Ser: 0.75 mg/dL (ref 0.44–1.00)
GFR calc Af Amer: >60 mL/min (ref >60)
GLUCOSE: 118 mg/dL — AB (ref 65–99)
Potassium: 3.6 mmol/L (ref 3.5–5.1)
SODIUM: 141 mmol/L (ref 135–145)

## 2015-10-05 LAB — I-STAT TROPONIN, ED
TROPONIN I, POC: 0 ng/mL (ref 0.00–0.08)
TROPONIN I, POC: 0.01 ng/mL (ref 0.00–0.08)

## 2015-10-05 MED ORDER — ACETAMINOPHEN 500 MG PO TABS
1000.0000 mg | ORAL_TABLET | Freq: Once | ORAL | Status: AC
  Administered 2015-10-05: 1000 mg via ORAL
  Filled 2015-10-05: qty 2

## 2015-10-05 MED ORDER — IBUPROFEN 600 MG PO TABS: 600.0000 mg | ORAL_TABLET | Freq: Four times a day (QID) | ORAL | 0 refills | Status: DC | PRN

## 2015-10-05 MED ORDER — ACETAMINOPHEN 500 MG PO TABS: 500.0000 mg | ORAL_TABLET | Freq: Four times a day (QID) | ORAL | 0 refills | Status: DC | PRN

## 2015-10-05 NOTE — ED Provider Notes (Signed)
[facility]-EMERGENCY DEPT Provider Note   CSN: [id number] Arrival date & time: 10/05/15  1351   History   Chief Complaint Chief Complaint  Patient presents with  . Chest Pain  . Shortness of Breath    HPI Michelle Boyd is a 65 y.o. female with no significant past medical history presents for 1 day of intermittent palpitations, mild left-sided headache, one year of blurry vision. No aggravating or alleviating factors. No new medications. No fever, chills, shortness of breath. There is mild chest pain with the palpitations. Patient with no current palpitations at this point, mild left-sided headache, gradual in onset.  The history is provided by the patient and a relative. A language interpreter was used.    Past Medical History:  Diagnosis Date  . Cholelithiasis   . Chronic knee pain   . Gallstone pancreatitis   . GERD (gastroesophageal reflux disease)   . Hypertension   . Memory loss     Patient Active Problem List   Diagnosis Date Noted  . Bilateral chronic knee pain 11/11/2014  . AKI (acute kidney injury) (HCC) 10/22/2013  . Sepsis secondary to UTI (HCC) 10/22/2013  . Acute renal failure (HCC) 10/22/2013  . Pyelonephritis, acute 10/22/2013  . Iron (Fe) deficiency anemia 10/22/2013  . Elevated LFTs 10/22/2013  . Microcytic anemia 10/05/2013  . Cholecystitis, acute 10/04/2013  . Other pancytopenia (HCC) 10/03/2013  . Gallstone pancreatitis 10/02/2013  . Gallstones 09/29/2013  . Non-traumatic compression fracture of L3 & L4 lumbar vertebra 08/24/2013  . Right knee pain 11/07/2012  . Hypertension 05/26/2012  . Hypokalemia 05/26/2012    Past Surgical History:  Procedure Laterality Date  . CATARACT EXTRACTION  2016  . CHOLECYSTECTOMY N/A 10/04/2013   Procedure: LAPAROSCOPIC CHOLECYSTECTOMY WITH INTRAOPERATIVE CHOLANGIOGRAM;  Surgeon: Axel FillerArmando Ramirez, MD;  Location: [facility] OR;  Service: General;  Laterality: N/A;  . EXTERNAL EAR SURGERY  years ago   right mastoidectomy    OB  History    No data available       Home Medications    Prior to Admission medications   Medication Sig Start Date End Date Taking? Authorizing Provider  amLODipine (NORVASC) 5 MG tablet Take 1 tablet (5 mg total) by mouth daily. 09/05/15  Yes Quentin Angstlugbemiga E Jegede, MD  aspirin EC 81 MG tablet Take 81 mg by mouth daily as needed for mild pain or moderate pain.   Yes Historical Provider, MD  hydrALAZINE (APRESOLINE) 50 MG tablet Take 1 tablet (50 mg total) by mouth 3 (three) times daily. 10/03/15  Yes Jaclyn ShaggyEnobong Amao, MD  naproxen (NAPROSYN) 500 MG tablet Take 1 tablet (500 mg total) by mouth 2 (two) times daily with a meal. 10/03/15  Yes Jaclyn ShaggyEnobong Amao, MD  acetaminophen (TYLENOL) 500 MG tablet Take 1 tablet (500 mg total) by mouth every 6 (six) hours as needed. 10/05/15   Dan HumphreysMichael Isack Lavalley, MD  amoxicillin-clavulanate (AUGMENTIN) 875-125 MG tablet Take 1 tablet by mouth 2 (two) times daily. Patient not taking: Reported on 10/03/2015 09/05/15   Santiago GladHeather Laisure, PA-C  ibuprofen (ADVIL,MOTRIN) 600 MG tablet Take 1 tablet (600 mg total) by mouth every 6 (six) hours as needed. 10/05/15   Dan HumphreysMichael Jaquay Morneault, MD    Family History Family History  Problem Relation Age of Onset  . Diabetes Father     Social History Social History  Substance Use Topics  . Smoking status: Never Smoker  . Smokeless tobacco: Never Used  . Alcohol use No     Allergies   Feosol [iron]  Review of Systems Review of Systems  Constitutional: Negative for chills and fever.  HENT: Negative for ear pain and sore throat.   Eyes: Negative for pain and visual disturbance.  Respiratory: Negative for cough and shortness of breath.   Cardiovascular: Negative for chest pain and palpitations.  Gastrointestinal: Negative for abdominal pain and vomiting.  Genitourinary: Negative for dysuria and hematuria.  Musculoskeletal: Negative for arthralgias and back pain.  Skin: Negative for color change and rash.  Neurological: Positive for  headaches. Negative for seizures and syncope.  All other systems reviewed and are negative.    Physical Exam Updated Vital Signs BP 150/68   Pulse 88   Temp 98.3 F (36.8 C) (Oral)   Resp 13   SpO2 99%   Physical Exam  Constitutional: She appears well-developed and well-nourished. No distress.  HENT:  Head: Normocephalic and atraumatic.  Eyes: Conjunctivae are normal.  Neck: Neck supple.  Cardiovascular: Normal rate and regular rhythm.   No murmur heard. Pulmonary/Chest: Effort normal and breath sounds normal. No respiratory distress.  Abdominal: Soft. There is no tenderness.  Musculoskeletal: She exhibits no edema.  Neurological: She is alert.  Normal strength and sensation bilateral upper lower extremities.  Normal coordination, normal gait.   Skin: Skin is warm and dry.  Psychiatric: She has a normal mood and affect.  Nursing note and vitals reviewed.    ED Treatments / Results  Labs (all labs ordered are listed, but only abnormal results are displayed) Labs Reviewed  BASIC METABOLIC PANEL - Abnormal; Notable for the following:       Result Value   Glucose, Bld 118 (*)    All other components within normal limits  CBC  I-STAT TROPOININ, ED  Rosezena Sensor, ED    EKG  EKG Interpretation  Date/Time:  Wednesday October 05 2015 14:00:28 EDT Ventricular Rate:  113 PR Interval:  152 QRS Duration: 72 QT Interval:  338 QTC Calculation: 463 R Axis:   3 Text Interpretation:  Sinus tachycardia Otherwise normal ECG Confirmed by [facility] MD, JULIE ([id number]) on 10/05/2015 6:46:41 PM       Radiology Dg Chest 2 View  Result Date: 10/05/2015 CLINICAL DATA:  Chest discomfort and shortness of breath EXAM: CHEST  2 VIEW COMPARISON:  October 16, 2014 FINDINGS: There is no edema or consolidation. The heart size and pulmonary vascularity are normal. No adenopathy. There is atherosclerotic calcification in the aorta. No bone lesions. IMPRESSION: Aortic atherosclerosis.  No  edema or consolidation. Electronically Signed   By: Bretta Bang III M.D.   On: 10/05/2015 15:39   Ct Head Wo Contrast  Result Date: 10/05/2015 CLINICAL DATA:  65 year old female with headache and visual changes today. Initial encounter. EXAM: CT HEAD WITHOUT CONTRAST TECHNIQUE: Contiguous axial images were obtained from the base of the skull through the vertex without intravenous contrast. COMPARISON:  Noncontrast head CT 10/16/2014. FINDINGS: Brain: Stable cerebral volume. Stable white matter hypodensity along the anterior limbs of the deep white matter capsules. Elsewhere Gray-white matter differentiation is within normal limits throughout the brain. No midline shift, ventriculomegaly, mass effect, evidence of mass lesion, intracranial hemorrhage or evidence of cortically based acute infarction. Vascular: Calcified atherosclerosis at the skull base. No suspicious intracranial vascular hyperdensity. Skull:  No acute osseous abnormality identified. Sinuses/Orbits: Visualized paranasal sinuses and mastoids are stable and well pneumatized. Other: Visualized orbits and scalp soft tissues are within normal limits. IMPRESSION: Stable mild for age white matter changes most commonly due to chronic small vessel disease.  No acute intracranial abnormality. Electronically Signed   By: Odessa FlemingH  Hall M.D.   On: 10/05/2015 19:24    Procedures Procedures (including critical care time)  Medications Ordered in ED Medications  acetaminophen (TYLENOL) tablet 1,000 mg (1,000 mg Oral Given 10/05/15 1830)     Initial Impression / Assessment and Plan / ED Course  I have reviewed the triage vital signs and the nursing notes.  Pertinent labs & imaging results that were available during my care of the patient were reviewed by me and considered in my medical decision making (see chart for details).  Clinical Course    Patient with mild left-sided headache, gradual in onset. Does not sound like subarachnoid. No fever or  neck pain to suggest meningitis. CT head obtained which was unremarkable for tumor, bleed. Patient given Tylenol for headache which improved her symptoms.  Patient with intermittent palpitations, EKG performed in triage was sinus tachycardia, improved to 80s without intervention during my examination. Patient without chest pain or shortness of breath at this time. No PE risk factors. Troponin 2 negative, chest x-ray without acute findings. Encourage patient to follow-up with primary care physician for consideration of Holter monitor or zio patch.   Patient to see her eye doctor for her one-year blurred vision.  No further episodes of palpitations, no atrial fibrillation, no SVT seen on monitor. Unclear what rhythm patient is referring to with palpitations.  Patient's daughter at bedside, patient daughter with no further questions at time of discharge.  Encourage Tylenol or ibuprofen at home for headache.  Discussed w/ Dr. Particia NearingHaviland.    Final Clinical Impressions(s) / ED Diagnoses   Final diagnoses:  Palpitations  Tension-type headache, not intractable, unspecified chronicity pattern    New Prescriptions New Prescriptions   ACETAMINOPHEN (TYLENOL) 500 MG TABLET    Take 1 tablet (500 mg total) by mouth every 6 (six) hours as needed.   IBUPROFEN (ADVIL,MOTRIN) 600 MG TABLET    Take 1 tablet (600 mg total) by mouth every 6 (six) hours as needed.     Dan HumphreysMichael Kinte Trim, MD 10/05/15 1941    Jacalyn LefevreJulie Haviland, MD 10/05/15 [phone]

## 2015-10-05 NOTE — ED Triage Notes (Signed)
Pt reports onset of chest discomfort 2pm with sob. Reports fever all over and joint pain. Afebrile at triage. Denies recent cough. HR 115 at triage.

## 2015-10-18 ENCOUNTER — Encounter: Payer: Self-pay | Admitting: Family Medicine

## 2015-10-18 ENCOUNTER — Other Ambulatory Visit: Payer: Self-pay | Admitting: Family Medicine

## 2015-10-18 ENCOUNTER — Ambulatory Visit: Payer: Medicaid Other | Attending: Family Medicine | Admitting: Family Medicine

## 2015-10-18 VITALS — BP 154/77 | HR 71 | Temp 97.6°F | Wt 131.0 lb

## 2015-10-18 DIAGNOSIS — K219 Gastro-esophageal reflux disease without esophagitis: Secondary | ICD-10-CM | POA: Diagnosis not present

## 2015-10-18 DIAGNOSIS — Z79899 Other long term (current) drug therapy: Secondary | ICD-10-CM | POA: Insufficient documentation

## 2015-10-18 DIAGNOSIS — I10 Essential (primary) hypertension: Secondary | ICD-10-CM | POA: Insufficient documentation

## 2015-10-18 DIAGNOSIS — K5900 Constipation, unspecified: Secondary | ICD-10-CM | POA: Insufficient documentation

## 2015-10-18 DIAGNOSIS — K5909 Other constipation: Secondary | ICD-10-CM | POA: Diagnosis not present

## 2015-10-18 DIAGNOSIS — Z7982 Long term (current) use of aspirin: Secondary | ICD-10-CM | POA: Diagnosis not present

## 2015-10-18 DIAGNOSIS — Z888 Allergy status to other drugs, medicaments and biological substances status: Secondary | ICD-10-CM | POA: Insufficient documentation

## 2015-10-18 DIAGNOSIS — Z9889 Other specified postprocedural states: Secondary | ICD-10-CM | POA: Diagnosis not present

## 2015-10-18 DIAGNOSIS — Z029 Encounter for administrative examinations, unspecified: Secondary | ICD-10-CM

## 2015-10-18 MED ORDER — LACTULOSE 10 GM/15ML PO SOLN: 10.0000 g | Freq: Three times a day (TID) | ORAL | 1 refills | Status: DC

## 2015-10-18 NOTE — Progress Notes (Signed)
C/C;pt states abdomen hurts after eating

## 2015-10-18 NOTE — Progress Notes (Signed)
Subjective:  Patient ID: Michelle Boyd, female    DOB: [birth year]  Age: 65 y.o. MRN: [id number]  CC: Hypertension   HPI Michelle Boyd is a 65 year old female with a history of hypertension, GERD who comes into the clinic requesting completion of immigration form for [place] citizenship due to the fact that she cannot speak English and is unable to take the exam. She also complains of abdominal pain and constipation; admits to straining in order to move her bowels. Denies blood in stools, nausea or vomiting.  Past Medical History:  Diagnosis Date  . Cholelithiasis   . Chronic knee pain   . Gallstone pancreatitis   . GERD (gastroesophageal reflux disease)   . Hypertension   . Memory loss     Past Surgical History:  Procedure Laterality Date  . CATARACT EXTRACTION  2016  . CHOLECYSTECTOMY N/A 10/04/2013   Procedure: LAPAROSCOPIC CHOLECYSTECTOMY WITH INTRAOPERATIVE CHOLANGIOGRAM;  Surgeon: Axel Filler, MD;  Location: [facility] OR;  Service: General;  Laterality: N/A;  . EXTERNAL EAR SURGERY  years ago   right mastoidectomy    Allergies  Allergen Reactions  . Feosol [Iron] Itching     Outpatient Medications Prior to Visit  Medication Sig Dispense Refill  . acetaminophen (TYLENOL) 500 MG tablet Take 1 tablet (500 mg total) by mouth every 6 (six) hours as needed. 30 tablet 0  . amLODipine (NORVASC) 5 MG tablet Take 1 tablet (5 mg total) by mouth daily. 90 tablet 0  . aspirin EC 81 MG tablet Take 81 mg by mouth daily as needed for mild pain or moderate pain.    . hydrALAZINE (APRESOLINE) 50 MG tablet Take 1 tablet (50 mg total) by mouth 3 (three) times daily. 90 tablet 2  . ibuprofen (ADVIL,MOTRIN) 600 MG tablet Take 1 tablet (600 mg total) by mouth every 6 (six) hours as needed. 30 tablet 0  . naproxen (NAPROSYN) 500 MG tablet Take 1 tablet (500 mg total) by mouth 2 (two) times daily with a meal. 30 tablet 0  . amoxicillin-clavulanate (AUGMENTIN) 875-125 MG tablet Take 1 tablet by mouth 2 (two)  times daily. (Patient not taking: Reported on 10/03/2015) 10 tablet 0   No facility-administered medications prior to visit.     ROS Review of Systems  Constitutional: Negative for activity change, appetite change and fatigue.  HENT: Negative for congestion, sinus pressure and sore throat.   Eyes: Negative for visual disturbance.  Respiratory: Negative for cough, chest tightness, shortness of breath and wheezing.   Cardiovascular: Negative for chest pain and palpitations.  Gastrointestinal: Positive for abdominal pain and constipation. Negative for abdominal distention.  Endocrine: Negative for polydipsia.  Genitourinary: Negative for dysuria and frequency.  Musculoskeletal: Negative for arthralgias and back pain.  Skin: Negative for rash.  Neurological: Negative for tremors, light-headedness and numbness.  Hematological: Does not bruise/bleed easily.  Psychiatric/Behavioral: Negative for agitation and behavioral problems.    Objective:  BP (!) 154/77 (BP Location: Right Arm, Patient Position: Sitting, Cuff Size: Small)   Pulse 71   Temp 97.6 F (36.4 C) (Oral)   Wt 131 lb (59.4 kg)   SpO2 99%   BMI 26.46 kg/m   BP/Weight 10/18/2015 10/05/2015 10/03/2015  Systolic BP 154 150 163  Diastolic BP 77 68 89  Wt. (Lbs) 131 - 131.2  BMI 26.46 - 26.5      Physical Exam  Constitutional: She is oriented to person, place, and time. She appears well-developed and well-nourished.  Cardiovascular: Normal rate, normal heart sounds  and intact distal pulses.   No murmur heard. Pulmonary/Chest: Effort normal and breath sounds normal. She has no wheezes. She has no rales. She exhibits no tenderness.  Abdominal: Soft. Bowel sounds are normal. She exhibits no distension and no mass. There is no tenderness.  Musculoskeletal: Normal range of motion.  Neurological: She is alert and oriented to person, place, and time.     Assessment & Plan:   1. Other constipation Advised on dietary  modifications to prevent constipation Increase fiber intake as well as water intake. - lactulose (CHRONULAC) 10 GM/15ML solution; Take 15 mLs (10 g total) by mouth 3 (three) times daily.  Dispense: 946 mL; Refill: 1  2. Encounters for administrative purpose Completed [place] citizenship form.   Meds ordered this encounter  Medications  . lactulose (CHRONULAC) 10 GM/15ML solution    Sig: Take 15 mLs (10 g total) by mouth 3 (three) times daily.    Dispense:  946 mL    Refill:  1    Follow-up: Return in about 6 weeks (around 11/29/2015) for follow up on constipation.   Jaclyn ShaggyEnobong Amao MD

## 2015-12-03 ENCOUNTER — Emergency Department (HOSPITAL_COMMUNITY): Payer: Medicaid Other

## 2015-12-03 ENCOUNTER — Observation Stay (HOSPITAL_COMMUNITY)
Admission: EM | Admit: 2015-12-03 | Discharge: 2015-12-05 | Disposition: A | Discharge status: Home / Self Care | Payer: Medicaid Other | Attending: Family Medicine | Admitting: Family Medicine

## 2015-12-03 ENCOUNTER — Encounter (HOSPITAL_COMMUNITY): Payer: Self-pay | Admitting: Nurse Practitioner

## 2015-12-03 DIAGNOSIS — M25561 Pain in right knee: Secondary | ICD-10-CM | POA: Diagnosis not present

## 2015-12-03 DIAGNOSIS — Z7902 Long term (current) use of antithrombotics/antiplatelets: Secondary | ICD-10-CM | POA: Diagnosis not present

## 2015-12-03 DIAGNOSIS — H9201 Otalgia, right ear: Secondary | ICD-10-CM | POA: Diagnosis not present

## 2015-12-03 DIAGNOSIS — I082 Rheumatic disorders of both aortic and tricuspid valves: Secondary | ICD-10-CM | POA: Diagnosis not present

## 2015-12-03 DIAGNOSIS — R200 Anesthesia of skin: Secondary | ICD-10-CM

## 2015-12-03 DIAGNOSIS — H538 Other visual disturbances: Secondary | ICD-10-CM | POA: Insufficient documentation

## 2015-12-03 DIAGNOSIS — R930 Abnormal findings on diagnostic imaging of skull and head, not elsewhere classified: Secondary | ICD-10-CM | POA: Diagnosis not present

## 2015-12-03 DIAGNOSIS — R002 Palpitations: Secondary | ICD-10-CM | POA: Insufficient documentation

## 2015-12-03 DIAGNOSIS — Z8673 Personal history of transient ischemic attack (TIA), and cerebral infarction without residual deficits: Secondary | ICD-10-CM | POA: Insufficient documentation

## 2015-12-03 DIAGNOSIS — I700 Atherosclerosis of aorta: Secondary | ICD-10-CM | POA: Insufficient documentation

## 2015-12-03 DIAGNOSIS — I10 Essential (primary) hypertension: Secondary | ICD-10-CM | POA: Diagnosis not present

## 2015-12-03 DIAGNOSIS — G43409 Hemiplegic migraine, not intractable, without status migrainosus: Secondary | ICD-10-CM | POA: Diagnosis not present

## 2015-12-03 DIAGNOSIS — M25562 Pain in left knee: Secondary | ICD-10-CM | POA: Insufficient documentation

## 2015-12-03 DIAGNOSIS — F0390 Unspecified dementia without behavioral disturbance: Secondary | ICD-10-CM | POA: Insufficient documentation

## 2015-12-03 DIAGNOSIS — R011 Cardiac murmur, unspecified: Secondary | ICD-10-CM | POA: Insufficient documentation

## 2015-12-03 DIAGNOSIS — E042 Nontoxic multinodular goiter: Secondary | ICD-10-CM | POA: Diagnosis not present

## 2015-12-03 DIAGNOSIS — Z7982 Long term (current) use of aspirin: Secondary | ICD-10-CM | POA: Diagnosis not present

## 2015-12-03 DIAGNOSIS — I7771 Dissection of carotid artery: Secondary | ICD-10-CM

## 2015-12-03 DIAGNOSIS — M47812 Spondylosis without myelopathy or radiculopathy, cervical region: Secondary | ICD-10-CM | POA: Diagnosis not present

## 2015-12-03 DIAGNOSIS — K219 Gastro-esophageal reflux disease without esophagitis: Secondary | ICD-10-CM | POA: Diagnosis not present

## 2015-12-03 DIAGNOSIS — R531 Weakness: Secondary | ICD-10-CM | POA: Diagnosis not present

## 2015-12-03 DIAGNOSIS — H5462 Unqualified visual loss, left eye, normal vision right eye: Secondary | ICD-10-CM | POA: Insufficient documentation

## 2015-12-03 DIAGNOSIS — G8929 Other chronic pain: Secondary | ICD-10-CM | POA: Diagnosis not present

## 2015-12-03 DIAGNOSIS — E876 Hypokalemia: Secondary | ICD-10-CM | POA: Diagnosis not present

## 2015-12-03 DIAGNOSIS — G459 Transient cerebral ischemic attack, unspecified: Secondary | ICD-10-CM | POA: Diagnosis present

## 2015-12-03 DIAGNOSIS — R42 Dizziness and giddiness: Secondary | ICD-10-CM

## 2015-12-03 LAB — BASIC METABOLIC PANEL
Anion gap: 8 (ref 5–15)
BUN: 14 mg/dL (ref 6–20)
CHLORIDE: 106 mmol/L (ref 101–111)
CO2: 24 mmol/L (ref 22–32)
Calcium: 9.2 mg/dL (ref 8.9–10.3)
Creatinine, Ser: 0.79 mg/dL (ref 0.44–1.00)
GFR calc Af Amer: >60 mL/min (ref >60)
GFR calc non Af Amer: >60 mL/min (ref >60)
GLUCOSE: 93 mg/dL (ref 65–99)
POTASSIUM: 3.3 mmol/L — AB (ref 3.5–5.1)
Sodium: 138 mmol/L (ref 135–145)

## 2015-12-03 LAB — CBC
HEMATOCRIT: 37.9 % (ref 36.0–46.0)
Hemoglobin: 12.3 g/dL (ref 12.0–15.0)
MCH: 27.4 pg (ref 26.0–34.0)
MCHC: 32.5 g/dL (ref 30.0–36.0)
MCV: 84.4 fL (ref 78.0–100.0)
Platelets: 175 10*3/uL (ref 150–400)
RBC: 4.49 MIL/uL (ref 3.87–5.11)
RDW: 14.2 % (ref 11.5–15.5)
WBC: 5 10*3/uL (ref 4.0–10.5)

## 2015-12-03 LAB — URINALYSIS, ROUTINE W REFLEX MICROSCOPIC
BILIRUBIN URINE: NEGATIVE
GLUCOSE, UA: NEGATIVE mg/dL
Hgb urine dipstick: NEGATIVE
KETONES UR: NEGATIVE mg/dL
Nitrite: NEGATIVE
PH: 7 (ref 5.0–8.0)
Protein, ur: NEGATIVE mg/dL
Specific Gravity, Urine: 1.005 (ref 1.005–1.030)

## 2015-12-03 LAB — TSH: TSH: 0.585 u[IU]/mL (ref 0.350–4.500)

## 2015-12-03 LAB — URINE MICROSCOPIC-ADD ON
Bacteria, UA: NONE SEEN
RBC / HPF: NONE SEEN RBC/hpf (ref 0–5)

## 2015-12-03 LAB — I-STAT TROPONIN, ED: TROPONIN I, POC: 0 ng/mL (ref 0.00–0.08)

## 2015-12-03 MED ORDER — ENOXAPARIN SODIUM 40 MG/0.4ML ~~LOC~~ SOLN
40.0000 mg | SUBCUTANEOUS | Status: DC
  Administered 2015-12-03 – 2015-12-05 (×3): 40 mg via SUBCUTANEOUS
  Filled 2015-12-03 (×3): qty 0.4

## 2015-12-03 MED ORDER — CLOPIDOGREL BISULFATE 75 MG PO TABS
75.0000 mg | ORAL_TABLET | Freq: Every day | ORAL | Status: DC
  Administered 2015-12-04 – 2015-12-05 (×2): 75 mg via ORAL
  Filled 2015-12-03 (×2): qty 1

## 2015-12-03 MED ORDER — SENNOSIDES-DOCUSATE SODIUM 8.6-50 MG PO TABS: 1.0000 | ORAL_TABLET | Freq: Every evening | ORAL | Status: DC | PRN

## 2015-12-03 MED ORDER — ASPIRIN EC 81 MG PO TBEC
81.0000 mg | DELAYED_RELEASE_TABLET | Freq: Every day | ORAL | Status: DC | PRN
  Administered 2015-12-04: 81 mg via ORAL
  Filled 2015-12-03: qty 1

## 2015-12-03 MED ORDER — POTASSIUM CHLORIDE CRYS ER 20 MEQ PO TBCR
40.0000 meq | EXTENDED_RELEASE_TABLET | Freq: Two times a day (BID) | ORAL | Status: AC
  Administered 2015-12-03 – 2015-12-04 (×2): 40 meq via ORAL
  Filled 2015-12-03 (×2): qty 2

## 2015-12-03 MED ORDER — STROKE: EARLY STAGES OF RECOVERY BOOK
Freq: Once | Status: AC
  Administered 2015-12-03: 18:00:00
  Filled 2015-12-03: qty 1

## 2015-12-03 MED ORDER — CLOPIDOGREL BISULFATE 75 MG PO TABS
300.0000 mg | ORAL_TABLET | Freq: Once | ORAL | Status: AC
  Administered 2015-12-03: 300 mg via ORAL
  Filled 2015-12-03: qty 4

## 2015-12-03 MED ORDER — ONDANSETRON HCL 4 MG/2ML IJ SOLN
4.0000 mg | Freq: Once | INTRAMUSCULAR | Status: AC
  Administered 2015-12-03: 4 mg via INTRAVENOUS
  Filled 2015-12-03: qty 2

## 2015-12-03 MED ORDER — MORPHINE SULFATE (PF) 4 MG/ML IV SOLN
4.0000 mg | Freq: Once | INTRAVENOUS | Status: AC
  Administered 2015-12-03: 4 mg via INTRAVENOUS
  Filled 2015-12-03: qty 1

## 2015-12-03 NOTE — ED Triage Notes (Signed)
Pt presents with c/o dizziness. She reports she has been dizzy for a long time, and she has been here for this in the past. She reports frontal headache and intermittent weakness and vision loss from her L eye  She denies n/v, falls. She is alert and breathing easily. She speaks nepali.

## 2015-12-03 NOTE — H&P (Signed)
Family Medicine Teaching [facility] Admission History and Physical Service Pager: [phone]  Patient name: Michelle Boyd Medical record number: [id number] Date of birth: Jul 03, 1950 Age: 65 y.o. Gender: female  Primary Care Provider: Jaclyn Shaggy, MD Consultants: Neurology  Code Status: FULL (discussed on admission)  Chief Complaint:  Headache  Assessment and Plan: Michelle Boyd is a 65 y.o. female presenting with headache . PMH is significant for HTN and GERD.   # TIA Presented to ED with frontal headache mostly on right side and right sided weakness/numbness.  MRI with remote lacunar infarct in right basal ganglia, chronic microvascular ischemia, no abnormality to explain dizziness. Currently prescribed Aspirin 81mg  daily. Troponin negative.  UA Negative (small leuk) but sq epithel present so likely dirty. Given one dose of Morphine and Zofran for dizziness in ED. Symptoms mostly resolved with some residual right facial and upper extremity numbness reported. Passed speech evaluation in ED. Murmur noted on exam and patient reports palpitations occasionally. Chart review shows she follows with [facility] and was first referred to their practice in 11/2014 for memory loss; reported intermittent confusion and diagnosed with severe dementia requiring family supervision 24hr/day. -Place in observation with tele, attending Dr. Randolm Idol - Vitals per unit routine - Neuro checks Q2h x 12 hours, then Q4 - Lipid panel - Hgb A1c ordered - TSH  - AM BMP - c/s Neurology, appreciate assistance - Echocardiogram  - Vascular US Carotids - Aspirin 81 mg daily - Plavix 300mg  initially, then 75 mg daily.  - PT/OT eval & treat  #Blurry vision . History of L eye blurriness for the past year.  Had cataract surgery on right eye.   -c/s ophthalmology  # Hypertension . BP on admission 151/67.  Home medication includes Amlodipine 5mg , Hydralazine 50mg  three times daily.  -Hold home  antihypertensives -Permissible hypertension  # Hypokalemia Potassium of 3.3 noted at admission.  -Give 40 kdur BID x 2 doses -AM BMET  FEN/GI: Heart healthy Prophylaxis: Lovenox  Disposition: Admit to tele for obs, attending Dr. Randolm Idol  History of Present Illness:  Michelle Boyd is a 65 y.o. female presenting to the ED with her daughter in law. Visit conducted with aid of Nepali interpretor.  Daughter in law was with her during the following events.  She has had headache for a while.  Today she complains of headache and per daughter in law, she felt dizzy and blurry vision this AM.  Pain in head and eyes, more in left eye.  Right sided pain over face and right scalp/head.  Stated her vision appeared cloudy.  She was acting her normal self and was at baseline.  At time this occurred, speech was normal, daughter noticed facial droop. Noted some difficulty opening her left eye.   Usually comes to the ED, gets medication and it gets better.  Has been before for similar symptoms and work up has been normal. Denies photophobia, phonophobia. Denies fever, chills, dysuria.  Denies recent illness. Denies recent travel.    Has a history of high blood pressure. Reports good compliance and takes blood pressure medication three times a day. Today she only took it once before coming here.  Reports pain located on right side of head and face with some numbness over right cheek and forehead.  Denies tinnitus.  Reports occasional numbness of right leg.  States that right sided facial pain may be related to a recent surgery she had.  She reports she had a bump behind her ear that was  removed (likely mastoidectomy) in [place] and thinks it may be the cause of her headaches.  Has had cataract surgery on her right eye and can see clearly.  Planning on getting surgery of left eye as well.  Vision in left eye is very blurry and has been told to wear glasses.  She believes her not wearing them may be reason behind eye pain and  headaches.  Blurred vision started 1 month ago and has gradually worsened.    Social Hx:  Denies smoking, drug use, alcohol use. Originally from [place], coming to [place] in 2011.  Lives at home with daughter in law and grandson.    Review Of Systems: Per HPI.  All other systems reviewed and negative.   ROS  Patient Active Problem List   Diagnosis Date Noted  . TIA (transient ischemic attack) 12/03/2015  . Bilateral chronic knee pain 11/11/2014  . AKI (acute kidney injury) (HCC) 10/22/2013  . Sepsis secondary to UTI (HCC) 10/22/2013  . Acute renal failure (HCC) 10/22/2013  . Pyelonephritis, acute 10/22/2013  . Iron (Fe) deficiency anemia 10/22/2013  . Elevated LFTs 10/22/2013  . Microcytic anemia 10/05/2013  . Cholecystitis, acute 10/04/2013  . Other pancytopenia (HCC) 10/03/2013  . Gallstone pancreatitis 10/02/2013  . Gallstones 09/29/2013  . Non-traumatic compression fracture of L3 & L4 lumbar vertebra 08/24/2013  . Right knee pain 11/07/2012  . Hypertension 05/26/2012  . Hypokalemia 05/26/2012   Past Medical History: Past Medical History:  Diagnosis Date  . Cholelithiasis   . Chronic knee pain   . Gallstone pancreatitis   . GERD (gastroesophageal reflux disease)   . Hypertension   . Memory loss    Past Surgical History: Past Surgical History:  Procedure Laterality Date  . CATARACT EXTRACTION  2016  . CHOLECYSTECTOMY N/A 10/04/2013   Procedure: LAPAROSCOPIC CHOLECYSTECTOMY WITH INTRAOPERATIVE CHOLANGIOGRAM;  Surgeon: Axel Filler, MD;  Location: [facility] OR;  Service: General;  Laterality: N/A;  . EXTERNAL EAR SURGERY  years ago   right mastoidectomy   Social History: Social History  Substance Use Topics  . Smoking status: Never Smoker  . Smokeless tobacco: Never Used  . Alcohol use No   Family History: Family History  Problem Relation Age of Onset  . Diabetes Father    Allergies and Medications: Allergies  Allergen Reactions  . Feosol [Iron] Itching   No  current facility-administered medications on file prior to encounter.    Current Outpatient Prescriptions on File Prior to Encounter  Medication Sig Dispense Refill  . acetaminophen (TYLENOL) 500 MG tablet Take 1 tablet (500 mg total) by mouth every 6 (six) hours as needed. 30 tablet 0  . amLODipine (NORVASC) 5 MG tablet Take 1 tablet (5 mg total) by mouth daily. 90 tablet 0  . aspirin EC 81 MG tablet Take 81 mg by mouth daily as needed for mild pain or moderate pain.    . hydrALAZINE (APRESOLINE) 50 MG tablet Take 1 tablet (50 mg total) by mouth 3 (three) times daily. 90 tablet 2  . ibuprofen (ADVIL,MOTRIN) 600 MG tablet Take 1 tablet (600 mg total) by mouth every 6 (six) hours as needed. 30 tablet 0  . lactulose (CHRONULAC) 10 GM/15ML solution Take 15 mLs (10 g total) by mouth 3 (three) times daily. 946 mL 1  . naproxen (NAPROSYN) 500 MG tablet Take 1 tablet (500 mg total) by mouth 2 (two) times daily with a meal. 30 tablet 0  . amoxicillin-clavulanate (AUGMENTIN) 875-125 MG tablet Take 1 tablet by mouth  2 (two) times daily. (Patient not taking: Reported on 12/03/2015) 10 tablet 0  . [DISCONTINUED] chlorthalidone (HYGROTON) 25 MG tablet Take 25 mg by mouth daily.    . [DISCONTINUED] lisinopril (PRINIVIL,ZESTRIL) 10 MG tablet Take 10 mg by mouth daily.     Objective: BP 149/76   Pulse 72   Temp 98.4 F (36.9 C) (Oral)   Resp 10   SpO2 100%  Exam: General: 65 yo elderly female lying in hospital bed in NAD Eyes: PERRL, EOMI, right eye s/p cataract surg, left eye vision is blurry, no conjunctival injection, no scleral icterus ENTM: MMM, oropharynx clear, no nasal discharge Neck: supple, no lymphadenopathy, bruit vs. radiation of systolic murmur noted in right carotid artery Cardiovascular: RRR, systolic murmur present, pulses present  Respiratory: CTA B/L, no wheeze noted, normal effort of breathing Gastrointestinal: Soft, NT ND +bs, no masses MSK: no edema, FROMx4 Derm: no rashes or  lesions noted, skin warm and dry Neuro: symmetric facial movements, diminished sensation over right cheek, normal speech with no slurring of words, ambulates with cane, 5/5 strength in bilateral UE and LE.  Diminished sensation over right arm.  Finger to nose normal on left, some difficulty with right hand, unable to follow instructions for visual field testing Psych: pleasant, appropriate mood  Labs and Imaging: CBC BMET   Recent Labs Lab 12/03/15 1137  WBC 5.0  HGB 12.3  HCT 37.9  PLT 175    Recent Labs Lab 12/03/15 1137  NA 138  K 3.3*  CL 106  CO2 24  BUN 14  CREATININE 0.79  GLUCOSE 93  CALCIUM 9.2    - Troponin negative - TSH 0.585  Mr Brain Wo Contrast  Result Date: 12/03/2015 CLINICAL DATA:  Frontal headaches. Intermittent right-sided weakness weakness and left-sided vision loss. EXAM: MRI HEAD WITHOUT CONTRAST TECHNIQUE: Multiplanar, multiecho pulse sequences of the brain and surrounding structures were obtained without intravenous contrast. COMPARISON:  CT head without contrast 10/05/2015. FINDINGS: Brain: The diffusion-weighted images demonstrate no evidence for acute or subacute infarction. Moderate generalized atrophy and white matter disease is present bilaterally. A remote lacunar infarct is present in the right lentiform nucleus. Six punctate foci of susceptibility are present throughout the basal ganglia and an additional foci adjacent to the atrium of the right lateral ventricle. These are compatible with remote punctate hemorrhage. No acute hemorrhage is present. The ventricles are proportionate to the degree of atrophy. The brainstem and cerebellum are normal. The internal auditory canals are within normal limits. Vascular: Flow is present in the major intracranial arteries. Skull and upper cervical spine: Craniocervical junction is within normal limits. Grade 1 degenerative anterolisthesis is present at C3-4. Alignment is otherwise anatomic. Cervical spine is  otherwise unremarkable. Sinuses/Orbits: Mild mucosal thickening is present in the left maxillary sinus. The remaining paranasal sinuses are clear. A right lens replacement is present. The globes and orbits are otherwise intact. IMPRESSION: 1. No acute or focal abnormality to explain the patient's dizziness, right side weakness, or left visual loss. 2. Remote lacunar infarct in the right basal ganglia. 3. Moderate generalized atrophy and white matter disease is advanced for age. This likely reflects the sequela of chronic microvascular ischemia. 4. Punctate foci of susceptibility in the basal ganglia may reflect underlying vasculitis. Electronically Signed   By: Marin Robertshristopher  Mattern M.D.   On: 12/03/2015 15:27   Freddrick MarchYashika Amin, MD 12/03/2015, 4:19 PM PGY-1, [facility] FPTS Intern pager: [phone], text pages welcome  Upper Level Addendum:  I have  seen and evaluated this patient along with Dr. Nelson Chimes and reviewed the above note, making necessary revisions in blue.   Dr. Garry Heater, DO, PGY3 12/03/2015; 8:32 PM

## 2015-12-03 NOTE — ED Provider Notes (Signed)
[facility]-EMERGENCY DEPT Provider Note   CSN: [id number] Arrival date & time: 12/03/15  1125     History   Chief Complaint Chief Complaint  Patient presents with  . Dizziness    HPI  Blood pressure 151/67, pulse 68, temperature 98.4 F (36.9 C), temperature source Oral, resp. rate 20, SpO2 99 %.  Michelle Boyd is a 65 y.o. female complaining of left-sided headache, moderate in nature, taking Tylenol at home with little relief, she's had this for over a year, she also has intermittent left eye blurred vision for 1 month. States that she had a unknown surgery on the right eye about a year ago, was told she would have to have surgery on the left eye but has not followed with her ophthalmologist. She also has right-sided arm and leg weakness onset 3 days ago, now resolved. No chest pain, fever, chills, nausea, vomiting, neck pain.  Translator used for communication, native language to [place]. Patient also has daughter-in-law' at bedside who helps clarify information.  HPI  Past Medical History:  Diagnosis Date  . Cholelithiasis   . Chronic knee pain   . Gallstone pancreatitis   . GERD (gastroesophageal reflux disease)   . Hypertension   . Memory loss     Patient Active Problem List   Diagnosis Date Noted  . TIA (transient ischemic attack) 12/03/2015  . Bilateral chronic knee pain 11/11/2014  . AKI (acute kidney injury) (HCC) 10/22/2013  . Sepsis secondary to UTI (HCC) 10/22/2013  . Acute renal failure (HCC) 10/22/2013  . Pyelonephritis, acute 10/22/2013  . Iron (Fe) deficiency anemia 10/22/2013  . Elevated LFTs 10/22/2013  . Microcytic anemia 10/05/2013  . Cholecystitis, acute 10/04/2013  . Other pancytopenia (HCC) 10/03/2013  . Gallstone pancreatitis 10/02/2013  . Gallstones 09/29/2013  . Non-traumatic compression fracture of L3 & L4 lumbar vertebra 08/24/2013  . Right knee pain 11/07/2012  . Hypertension 05/26/2012  . Hypokalemia 05/26/2012    Past Surgical History:    Procedure Laterality Date  . CATARACT EXTRACTION  2016  . CHOLECYSTECTOMY N/A 10/04/2013   Procedure: LAPAROSCOPIC CHOLECYSTECTOMY WITH INTRAOPERATIVE CHOLANGIOGRAM;  Surgeon: Axel Filler, MD;  Location: [facility] OR;  Service: General;  Laterality: N/A;  . EXTERNAL EAR SURGERY  years ago   right mastoidectomy    OB History    No data available       Home Medications    Prior to Admission medications   Medication Sig Start Date End Date Taking? Authorizing Provider  acetaminophen (TYLENOL) 500 MG tablet Take 1 tablet (500 mg total) by mouth every 6 (six) hours as needed. 10/05/15  Yes Dan Humphreys, MD  amLODipine (NORVASC) 5 MG tablet Take 1 tablet (5 mg total) by mouth daily. 09/05/15  Yes Quentin Angst, MD  aspirin EC 81 MG tablet Take 81 mg by mouth daily as needed for mild pain or moderate pain.   Yes Historical Provider, MD  hydrALAZINE (APRESOLINE) 50 MG tablet Take 1 tablet (50 mg total) by mouth 3 (three) times daily. 10/03/15  Yes Jaclyn Shaggy, MD  ibuprofen (ADVIL,MOTRIN) 600 MG tablet Take 1 tablet (600 mg total) by mouth every 6 (six) hours as needed. 10/05/15  Yes Dan Humphreys, MD  lactulose (CHRONULAC) 10 GM/15ML solution Take 15 mLs (10 g total) by mouth 3 (three) times daily. 10/18/15  Yes Jaclyn Shaggy, MD  naproxen (NAPROSYN) 500 MG tablet Take 1 tablet (500 mg total) by mouth 2 (two) times daily with a meal. 10/03/15  Yes Jaclyn Shaggy, MD  amoxicillin-clavulanate (AUGMENTIN) 875-125 MG tablet Take 1 tablet by mouth 2 (two) times daily. Patient not taking: Reported on 12/03/2015 09/05/15   Santiago Glad, PA-C    Family History Family History  Problem Relation Age of Onset  . Diabetes Father     Social History Social History  Substance Use Topics  . Smoking status: Never Smoker  . Smokeless tobacco: Never Used  . Alcohol use No     Allergies   Feosol [iron]   Review of Systems Review of Systems  10 systems reviewed and found to be negative, except  as noted in the HPI.   Physical Exam Updated Vital Signs BP 149/76   Pulse 72   Temp 98.4 F (36.9 C) (Oral)   Resp 10   SpO2 100%   Physical Exam  Constitutional: She is oriented to person, place, and time. She appears well-developed and well-nourished. No distress.  HENT:  Head: Normocephalic and atraumatic.  Mouth/Throat: Oropharynx is clear and moist.  Eyes: Conjunctivae and EOM are normal. Pupils are equal, round, and reactive to light.  Neck: Normal range of motion.  Cardiovascular: Normal rate, regular rhythm and intact distal pulses.   Pulmonary/Chest: Effort normal and breath sounds normal.  Abdominal: Soft. There is no tenderness.  Musculoskeletal: Normal range of motion.  Neurological: She is alert and oriented to person, place, and time.  III/IV/VI-Extraocular movements intact.  Pupils reactive bilaterally. V/VII-Smile symmetric, equal eyebrow raise,  facial sensation intact VIII- Hearing grossly intact IX/X-Normal gag XI-bilateral shoulder shrug XII-midline tongue extension Motor: 5/5 bilaterally   Skin: She is not diaphoretic.  Psychiatric: She has a normal mood and affect.  Nursing note and vitals reviewed.    ED Treatments / Results  Labs (all labs ordered are listed, but only abnormal results are displayed) Labs Reviewed  BASIC METABOLIC PANEL - Abnormal; Notable for the following:       Result Value   Potassium 3.3 (*)    All other components within normal limits  URINALYSIS, ROUTINE W REFLEX MICROSCOPIC (NOT AT [facility]) - Abnormal; Notable for the following:    Leukocytes, UA SMALL (*)    All other components within normal limits  URINE MICROSCOPIC-ADD ON - Abnormal; Notable for the following:    Squamous Epithelial / LPF 0-5 (*)    All other components within normal limits  CBC  I-STAT TROPOININ, ED    EKG  EKG Interpretation  Date/Time:  Saturday December 03 2015 11:29:45 EDT Ventricular Rate:  65 PR Interval:  144 QRS Duration: 82 QT  Interval:  428 QTC Calculation: 445 R Axis:   9 Text Interpretation:  Normal sinus rhythm Minimal voltage criteria for LVH, may be normal variant Nonspecific ST abnormality Abnormal ECG No significant change was found Confirmed by Manus Gunning  MD, STEPHEN ([id number]) on 12/03/2015 11:47:41 AM Also confirmed by Manus Gunning  MD, STEPHEN [phone]), editor [place]  CCT, BEVERLY ([id number])  on 12/03/2015 12:23:38 PM       Radiology Mr Brain Wo Contrast  Result Date: 12/03/2015 CLINICAL DATA:  Frontal headaches. Intermittent right-sided weakness weakness and left-sided vision loss. EXAM: MRI HEAD WITHOUT CONTRAST TECHNIQUE: Multiplanar, multiecho pulse sequences of the brain and surrounding structures were obtained without intravenous contrast. COMPARISON:  CT head without contrast 10/05/2015. FINDINGS: Brain: The diffusion-weighted images demonstrate no evidence for acute or subacute infarction. Moderate generalized atrophy and white matter disease is present bilaterally. A remote lacunar infarct is present in the right lentiform nucleus. Six punctate foci of susceptibility are present throughout  the basal ganglia and an additional foci adjacent to the atrium of the right lateral ventricle. These are compatible with remote punctate hemorrhage. No acute hemorrhage is present. The ventricles are proportionate to the degree of atrophy. The brainstem and cerebellum are normal. The internal auditory canals are within normal limits. Vascular: Flow is present in the major intracranial arteries. Skull and upper cervical spine: Craniocervical junction is within normal limits. Grade 1 degenerative anterolisthesis is present at C3-4. Alignment is otherwise anatomic. Cervical spine is otherwise unremarkable. Sinuses/Orbits: Mild mucosal thickening is present in the left maxillary sinus. The remaining paranasal sinuses are clear. A right lens replacement is present. The globes and orbits are otherwise intact. IMPRESSION: 1. No acute or  focal abnormality to explain the patient's dizziness, right side weakness, or left visual loss. 2. Remote lacunar infarct in the right basal ganglia. 3. Moderate generalized atrophy and white matter disease is advanced for age. This likely reflects the sequela of chronic microvascular ischemia. 4. Punctate foci of susceptibility in the basal ganglia may reflect underlying vasculitis. Electronically Signed   By: Marin Robertshristopher  Mattern M.D.   On: 12/03/2015 15:27    Procedures Procedures (including critical care time)  Medications Ordered in ED Medications  morphine 4 MG/ML injection 4 mg (4 mg Intravenous Given 12/03/15 1408)  ondansetron (ZOFRAN) injection 4 mg (4 mg Intravenous Given 12/03/15 1409)     Initial Impression / Assessment and Plan / ED Course  I have reviewed the triage vital signs and the nursing notes.  Pertinent labs & imaging results that were available during my care of the patient were reviewed by me and considered in my medical decision making (see chart for details).  Clinical Course    Vitals:   12/03/15 1132 12/03/15 1215 12/03/15 1230  BP: 151/67 150/75 149/76  Pulse: 68 68 72  Resp: 20 13 10   Temp: 98.4 F (36.9 C)    TempSrc: Oral    SpO2: 99% 99% 100%    Medications  morphine 4 MG/ML injection 4 mg (4 mg Intravenous Given 12/03/15 1408)  ondansetron (ZOFRAN) injection 4 mg (4 mg Intravenous Given 12/03/15 1409)    Gael Candler is 65 y.o. female presenting withPersistent headache, left eye blurred vision intermittently over the course of the last several months. Patient is also reported a right-sided arm and leg pain onset 3 days ago. Strength is grossly normal on my exam. Will obtain MRI, basic blood work, pain medication given.   MRI with no acute abnormality however she does have a remote lacunar infarct in the right basal ganglia, there is a punctate abnormality in the basal ganglia which may represent vasculitis. Will need admission for TIA workup,  unassigned admission to family practice. Case discussed with my practice resident who accepts admission for Dr. Randolm IdolFletke.   This is a shared visit with the attending physician who personally evaluated the patient and agrees with the care plan.      Final Clinical Impressions(s) / ED Diagnoses   Final diagnoses:  Right sided weakness  Transient cerebral ischemia, unspecified type    New Prescriptions New Prescriptions   No medications on file     Wynetta Emeryicole Finnleigh Marchetti, PA-C 12/03/15 1614    Glynn OctaveStephen Rancour, MD 12/04/15 [phone]

## 2015-12-03 NOTE — ED Notes (Signed)
Patient taken to MRI

## 2015-12-03 NOTE — Consult Note (Signed)
Neurology Consult Note  Reason for Consultation: TIA  Requesting provider: Donnella ShamKyle Fletke, MD  CC: Right side numb/weak  HPI: This is a 65 year old [place] woman who is admitted for TIA evaluation. She presented to the ED today with complaints of left-sided headache for one year, intermittent blurry vision in the left eye for one month, and right-sided arm and leg weakness that started 3 days ago. Her right-sided symptoms are extremely inconsistent, and are variably described as weakness, tingling, and pain. She had an unremarkable neurologic examination in the emergency department and for the admitting team. 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 Neurology consultation is requested for further recommendations.  On reviewing the chart, she has a long-standing history of short-term memory problems. She has been seen by Dr. Joycelyn SchmidVikram Penumalli [facility] and I have reviewed his last note from 03/04/15. He documents that she has had problems with short-term memory since approximately 2013. This is steadily worsened. She was requiring caregiver and family supervision 24 hours per day at that time. It is noteworthy that on his review of systems, she also complained of confusion, numbness, weakness, and dizziness. He documented evidence of a peripheral neuropathy in both feet with trace deep tendon reflexes, a slow unsteady gait, and slow finger-nose. His impression was that she had a severe dementia and recommended palliative care home consult for advanced care planning.  PMH:  Past Medical History:  Diagnosis Date  . Cholelithiasis   . Chronic knee pain   . Gallstone pancreatitis   . GERD (gastroesophageal reflux disease)   . Hypertension   . Memory loss      PSH:  Past Surgical History:  Procedure Laterality Date  . CATARACT EXTRACTION  2016  . CHOLECYSTECTOMY N/A 10/04/2013   Procedure: LAPAROSCOPIC CHOLECYSTECTOMY WITH INTRAOPERATIVE CHOLANGIOGRAM;  Surgeon: Axel FillerArmando Ramirez, MD;  Location: [facility] OR;  Service: General;  Laterality: N/A;  . EXTERNAL EAR SURGERY  years ago   right mastoidectomy    Family history: Family History  Problem Relation Age of Onset  . Diabetes Father     Social history:  Social History   Social History  . Marital status: Divorced    Spouse name: N/A  . Number of children: N/A  . Years of education: ESL   Occupational History  .      na   Social History Main Topics  . Smoking status: Never Smoker  . Smokeless tobacco: Never Used  . Alcohol use No  . Drug use: No  . Sexual activity: No   Other Topics Concern  . Not on file   Social History Narrative   Lives at home with son, daughter-in-law   Caffeine use- coffee 3-4 cups daily, tea occas    Current outpatient meds: Current Meds  Medication Sig  . acetaminophen (TYLENOL) 500 MG tablet Take 1 tablet (500 mg total) by mouth every 6 (six) hours as needed.  Marland Kitchen. amLODipine (NORVASC) 5 MG tablet Take 1 tablet (5 mg total) by mouth daily.  Marland Kitchen. aspirin EC 81 MG tablet Take 81 mg by mouth daily as needed for mild pain or moderate pain.  . hydrALAZINE (APRESOLINE) 50 MG tablet Take 1 tablet (50 mg total) by mouth 3 (three) times daily.  Marland Kitchen. ibuprofen (ADVIL,MOTRIN) 600 MG tablet Take 1 tablet (600  mg total) by mouth every 6 (six) hours as needed.  . lactulose (CHRONULAC) 10 GM/15ML solution Take 15 mLs (10 g total) by mouth 3 (three) times daily.  . naproxen (NAPROSYN) 500 MG tablet Take 1 tablet (500 mg total) by mouth 2 (two) times daily with a meal.    Current inpatient meds:  Current Facility-Administered Medications  Medication Dose Route Frequency Provider Last Rate Last Dose  . aspirin EC tablet 81 mg  81 mg Oral Daily PRN [place] N Rumley, DO       . [START ON 12/04/2015] clopidogrel (PLAVIX) tablet 75 mg  75 mg Oral Daily [place] N Rumley, DO      . enoxaparin (LOVENOX) injection 40 mg  40 mg Subcutaneous Q24H [place] N Rumley, DO   40 mg at 12/03/15 1858  . potassium chloride SA (K-DUR,KLOR-CON) CR tablet 40 mEq  40 mEq Oral BID [place] N Rumley, DO      . senna-docusate (Senokot-S) tablet 1 tablet  1 tablet Oral QHS PRN Araceli Bouche, DO        Allergies: Allergies  Allergen Reactions  . Feosol [Iron] Itching    ROS: As per HPI. A full 14-point review of systems was performed and is otherwise Notable for chronic dizziness, chronic headaches, chronic blurry vision, and memory loss.  PE:  BP (!) 155/68 (BP Location: Right Arm)   Pulse 71   Temp 97.6 F (36.4 C) (Oral)   Resp 14   SpO2 96%   General: Thin woman lying in bed, no acute distress. She was resting with her eyes closed but opens eyes briskly to voice. She will follow commands with evident psychomotor slowing. No obvious dysarthria. HEENT: Normocephalic. Neck supple without LAD. MMM, OP clear. Sclerae anicteric. No conjunctival injection.  CV: Regular, 2/6 murmur noted at the left sternal border radiating to the carotids. Carotid pulses full and symmetric, no bruits. Distal pulses 2+ and symmetric.  Lungs: CTAB.  Abdomen: Soft, non-distended, non-tender. Bowel sounds present x4.  Extremities: No C/C/E. Neuro:  CN: Pupils are equal and round. They are symmetrically reactive from 3-->2 mm. EOMI notable for breakup of smooth pursuits without nystagmus. No reported diplopia. Corneals are intact. Face is symmetric at rest with normal strength and mobility. Hearing appears intact to conversational voice. Bilateral SCM and trapezii are 5/5. Tongue is midline with normal bulk and mobility.  Motor: Normal bulk, tone, and strength. Effort is quite limited, but with initial effort strength appears to be normal. No tremor or other abnormal movements. No drift.  Sensation:  Difficult to assess given the patient's effort. She withdraws and grimaces to minimal noxious stimuli 4.  DTRs: 2+, symmetric. Toes downgoing bilaterally. No pathologic reflexes.  Coordination: Finger-to-nose is without dysmetria bilaterally. Finger taps are normal in amplitude and speed, no decrement.    Labs:  Lab Results  Component Value Date   WBC 5.0 12/03/2015   HGB 12.3 12/03/2015   HCT 37.9 12/03/2015   PLT 175 12/03/2015   GLUCOSE 93 12/03/2015   CHOL 183 06/06/2015   TRIG 131 06/06/2015   HDL 53 06/06/2015   LDLCALC 104 06/06/2015   ALT 34 10/25/2013   AST 43 (H) 10/25/2013   NA 138 12/03/2015   K 3.3 (L) 12/03/2015   CL 106 12/03/2015   CREATININE 0.79 12/03/2015   BUN 14 12/03/2015   CO2 24 12/03/2015   TSH 0.462 11/22/2014   INR 1.06 10/02/2013   HGBA1C 5.8 (H) 11/22/2014   Troponin 0.00  Urinalysis notable for small leukocyte esterase, 0-5 white blood cells, 0-5 squamous epithelials  Imaging:  I have personally and independently reviewed the MRI scan of the brain without contrast from today. This shows moderate to severe diffuse generalized atrophy with hydrocephalus ex vacuo. A moderate burden of chronic small vessel ischemic changes noted in the bihemispheric white matter. A focal chronic lacunar infarction is seen in the right basal ganglia. Prominent perivascular spaces are present in both basal ganglia, which is a normal variant.  Assessment and Plan:  1. Right sided weakness: By history, she had symptoms on her right side that were at times described as pain, other times described as numbness, and still other times described as weakness. These were reportedly present for 3 days but she has no objective findings on her examination here in hospital. This would make cerebrovascular disease extremely unlikely to be an explanation for her presentation. MRI brain confirms no evidence of acute stroke or other structural pathology to explain her symptoms. TIA is  excluded by reported presence of symptoms for 3 days. At this point, there is likely little utility in proceeding with additional stroke evaluation, particularly given her severe baseline dementia. Given her underlying small vessel disease and evidence of prior lacunar infarction on MRI, agree with antiplatelet therapy and statin for secondary prevention. Ensure tight control of vascular risk factors including blood pressure, lipids, and glucose as needed.  2. Abnormal MRI brain: The MRI was interpreted as showing possible vasculitis involving the basal ganglia. There are several scattered punctate areas of increased susceptibility, most involving the left basal ganglia but also noted in the anterior right temporal lobe, the left thalamus, and right basal ganglia. These indicate areas of microhemorrhage and are non-specific to etiology. While vasculitis is possible, it would be unusual to have relatively isolated involvement of the basal ganglia, making it unlikely. This distribution of microbleeds is more likely a marker of her underlying small vessel disease.   3. Dementia: She has been diagnosed with a severe dementia which has been gradually progressive over the past 4 years. When she was last seen by her outpatient neurologist in January 2017, she was requiring 24-hour supervision and assistance and referral was placed for outpatient palliative care. No obvious acute issues regarding her dementia. However, this places her at increased risk for encephalopathy and delirium from all causes. It would also predict delayed and perhaps incomplete recovery from same. Avoid CNS active medications to the greatest extent possible, specifically avoiding benzodiazepines, opiates, and anything with strong anticholinergic properties (Benadryl, tricyclic antidepressants, etc.).  Thank you for this consultation. At this time I have no additional recommendations and will sign off. Please call if any additional questions  arise.

## 2015-12-03 NOTE — Progress Notes (Signed)
Family medicine Practice  notified of patient admission

## 2015-12-03 NOTE — ED Notes (Signed)
Patient speaks Nepali. Patient states she's had h/a, dizziness, cloudy vision in left eye for a month. No hx of CVA. No blood thinners. Hx of eye surgery in right eye a year ago. A/ox4. Equal strength and sensory upon assessment. NIHSS 0. MRI completed. Stroke swallow eval completed.

## 2015-12-03 NOTE — ED Notes (Signed)
Patient states she's had h/a, dizziness, cloudy vision in left eye for a month. No hx of CVA. No blood thinners. Hx of eye surgery in right eye a year ago. A/ox4. Equal strength and sensory upon assessment.

## 2015-12-03 NOTE — ED Notes (Signed)
Pt transported to MRI 

## 2015-12-03 NOTE — Progress Notes (Signed)
Accepted patient from ED-patient denies pain. Patient speaks Nepali, her daughter-in law is at her bedside-who reports that they are at the hospital because "sometimes pt's left hand and left leg are sleepy" and "sometimes right foot is sleepy too" Will continue to monitor.

## 2015-12-04 ENCOUNTER — Observation Stay (HOSPITAL_COMMUNITY): Payer: Medicaid Other

## 2015-12-04 ENCOUNTER — Observation Stay (HOSPITAL_BASED_OUTPATIENT_CLINIC_OR_DEPARTMENT_OTHER): Payer: Medicaid Other

## 2015-12-04 DIAGNOSIS — R42 Dizziness and giddiness: Secondary | ICD-10-CM

## 2015-12-04 DIAGNOSIS — R200 Anesthesia of skin: Secondary | ICD-10-CM

## 2015-12-04 DIAGNOSIS — R202 Paresthesia of skin: Secondary | ICD-10-CM | POA: Diagnosis not present

## 2015-12-04 DIAGNOSIS — R011 Cardiac murmur, unspecified: Secondary | ICD-10-CM | POA: Diagnosis not present

## 2015-12-04 DIAGNOSIS — G501 Atypical facial pain: Secondary | ICD-10-CM | POA: Diagnosis not present

## 2015-12-04 LAB — VAS US CAROTID
LCCADSYS: -93 cm/s
LCCAPDIAS: 19 cm/s
LEFT ECA DIAS: -10 cm/s
LEFT VERTEBRAL DIAS: -17 cm/s
Left CCA dist dias: -26 cm/s
Left CCA prox sys: 94 cm/s
Left ICA dist dias: -25 cm/s
Left ICA dist sys: -71 cm/s
Left ICA prox dias: -21 cm/s
Left ICA prox sys: -78 cm/s
RCCADSYS: -62 cm/s
RCCAPDIAS: 11 cm/s
RCCAPSYS: 79 cm/s
RIGHT ECA DIAS: -7 cm/s
RIGHT VERTEBRAL DIAS: -14 cm/s

## 2015-12-04 LAB — LIPID PANEL
CHOL/HDL RATIO: 4.3 ratio
Cholesterol: 198 mg/dL (ref 0–200)
HDL: 46 mg/dL (ref >40)
LDL CALC: 132 mg/dL — AB (ref 0–99)
TRIGLYCERIDES: 100 mg/dL (ref <150)
VLDL: 20 mg/dL (ref 0–40)

## 2015-12-04 LAB — BASIC METABOLIC PANEL
ANION GAP: 9 (ref 5–15)
BUN: 15 mg/dL (ref 6–20)
CHLORIDE: 108 mmol/L (ref 101–111)
CO2: 24 mmol/L (ref 22–32)
Calcium: 8.8 mg/dL — ABNORMAL LOW (ref 8.9–10.3)
Creatinine, Ser: 0.9 mg/dL (ref 0.44–1.00)
GFR calc Af Amer: >60 mL/min (ref >60)
GLUCOSE: 83 mg/dL (ref 65–99)
POTASSIUM: 4.2 mmol/L (ref 3.5–5.1)
Sodium: 141 mmol/L (ref 135–145)

## 2015-12-04 MED ORDER — IOPAMIDOL (ISOVUE-370) INJECTION 76%
INTRAVENOUS | Status: AC
  Filled 2015-12-04: qty 50

## 2015-12-04 MED ORDER — DICLOFENAC SODIUM 1 % TD GEL
2.0000 g | Freq: Four times a day (QID) | TRANSDERMAL | Status: DC
  Administered 2015-12-04 – 2015-12-05 (×4): 2 g via TOPICAL
  Filled 2015-12-04 (×2): qty 100

## 2015-12-04 MED ORDER — ATORVASTATIN CALCIUM 40 MG PO TABS
40.0000 mg | ORAL_TABLET | Freq: Every day | ORAL | Status: DC
  Administered 2015-12-04 – 2015-12-05 (×2): 40 mg via ORAL
  Filled 2015-12-04 (×2): qty 1

## 2015-12-04 NOTE — Progress Notes (Signed)
PT Cancellation Note  Patient Details Name: Michelle Boyd MRN: [id number] DOB: 07/22/1950   Cancelled Treatment:    Reason Eval/Treat Not Completed: Patient at procedure or test/unavailable   Will follow up later today as time allows;  Otherwise, will follow up for PT tomorrow;   Thank you,  Van ClinesHolly Lasean Rahming, PT  Acute Rehabilitation Services Pager [phone] Office [phone]     Van ClinesGarrigan, Dalaney Needle [facility] 12/04/2015, 10:53 AM

## 2015-12-04 NOTE — Progress Notes (Signed)
Family Medicine Teaching Service Daily Progress Note Intern Pager: [phone]  Patient name: Michelle Boyd Medical record number: [id number] Date of birth: 1950-08-11 Age: 65 y.o. Gender: female  Primary Care Provider: Jaclyn Shaggy, MD Consultants: Neuro, ophthalmology Code Status: FULL  Pt Overview and Major Events to Date:  10/14 Admitted to FPTS  Assessment and Plan: Michelle Boyd is a 65 y.o. female presenting with headache. PMH is significant for HTN and GERD.   # Concern for carotid dissection Carotid duplex with concern for possible dissection of the distal left CCA vs plaque. Patient denying symptoms, however will proceed with CTA head and neck to rule out.  - CTA head/neck  # TIA Presented to ED with frontal headache mostly on right side and right sided weakness/numbness. MRI with remote lacunar infarct in right basal ganglia, chronic microvascular ischemia, no abnormality to explain dizziness. Currently prescribed Aspirin 81mg  daily.Trop neg. Symptoms mostly resolved with some residual right facial and upper extremity numbness reported. Per neuro, sx unlikely due to cerebrovascular disease and minimal utility in proceeding with stroke w/u.  - Vitals per unit routine - Neuro checks Q4  - Hgb A1c ordered - c/s Neurology, appreciate assistance - Echocardiogram  - Vascular US Carotids - Aspirin 81 mg daily - Plavix 300mg  initially, then 75 mg daily.  - PT/OT eval & treat  # New murmur: Systolic, most prominent in R ICS. Physical exam most consistent with aortic stenosis, however will proceed with echo.  - F/u echo - Surgery consulted - will see patient. Appreciate recs.   #Blurry vision History of L eye blurriness for the past year. Had cataract surgery on right eye. Vision worse than 20/200 in R eye on exam today, ~20/100 on L and with both eyes open. -C/s ophthalmology, appreciate recs  # Dizziness Positive modified Dix Hallpike today. Differential includes BPPV. No other  etiology identified thus far.  - Continue to monitor  # Hypertension Home medication includes Amlodipine 5mg , Hydralazine 50mg  three times daily. BP 120/66 this AM.  - Hold home antihypertensives - Permissible hypertension  # Hypokalemia Potassium of 3.3 noted at admission. S/p KDur x2. Improved to 4.2 this AM.  - Continue to monitor  FEN/GI: Heart healthy Prophylaxis: Lovenox  Disposition: Home pending medical improvement  Subjective:  Patient still complaining of dizziness this AM, particularly with movement. No complaints otherwise.   Objective: Temp:  [97 F (36.1 C)-98.4 F (36.9 C)] 97.8 F (36.6 C) (10/15 0530) Pulse Rate:  [65-73] 70 (10/15 0530) Resp:  [9-22] 18 (10/15 0530) BP: (105-155)/(60-71) 120/66 (10/15 0530) SpO2:  [94 %-97 %] 97 % (10/15 0530) Weight:  [137 lb 7 oz (62.3 kg)] 137 lb 7 oz (62.3 kg) (10/14 2043) Physical Exam: General: elderly female lying in bed in NAD Eyes: significantly decreased vision in R eye Cardiovascular: RRR, systolic murmur noted most prominent in R ICS Respiratory: CTAB, no wheezes, normal WOB on RA Abdomen: soft, non-tender, non-distended, +BS Neuro: positive modified Dix-Hallpike maneuver, no focal neuro deficits, A&Ox3 Psych: appropriate mood and affect  Laboratory:  Recent Labs Lab 12/03/15 1137  WBC 5.0  HGB 12.3  HCT 37.9  PLT 175    Recent Labs Lab 12/03/15 1137 12/04/15 0347  NA 138 141  K 3.3* 4.2  CL 106 108  CO2 24 24  BUN 14 15  CREATININE 0.79 0.90  CALCIUM 9.2 8.8*  GLUCOSE 93 83    Imaging/Diagnostic Tests: Mr Brain Wo Contrast  Result Date: 12/03/2015 CLINICAL DATA:  Frontal headaches. Intermittent  right-sided weakness weakness and left-sided vision loss. EXAM: MRI HEAD WITHOUT CONTRAST TECHNIQUE: Multiplanar, multiecho pulse sequences of the brain and surrounding structures were obtained without intravenous contrast. COMPARISON:  CT head without contrast 10/05/2015. FINDINGS: Brain: The  diffusion-weighted images demonstrate no evidence for acute or subacute infarction. Moderate generalized atrophy and white matter disease is present bilaterally. A remote lacunar infarct is present in the right lentiform nucleus. Six punctate foci of susceptibility are present throughout the basal ganglia and an additional foci adjacent to the atrium of the right lateral ventricle. These are compatible with remote punctate hemorrhage. No acute hemorrhage is present. The ventricles are proportionate to the degree of atrophy. The brainstem and cerebellum are normal. The internal auditory canals are within normal limits. Vascular: Flow is present in the major intracranial arteries. Skull and upper cervical spine: Craniocervical junction is within normal limits. Grade 1 degenerative anterolisthesis is present at C3-4. Alignment is otherwise anatomic. Cervical spine is otherwise unremarkable. Sinuses/Orbits: Mild mucosal thickening is present in the left maxillary sinus. The remaining paranasal sinuses are clear. A right lens replacement is present. The globes and orbits are otherwise intact. IMPRESSION: 1. No acute or focal abnormality to explain the patient's dizziness, right side weakness, or left visual loss. 2. Remote lacunar infarct in the right basal ganglia. 3. Moderate generalized atrophy and white matter disease is advanced for age. This likely reflects the sequela of chronic microvascular ischemia. 4. Punctate foci of susceptibility in the basal ganglia may reflect underlying vasculitis. Electronically Signed   By: Marin Robertshristopher  Mattern M.D.   On: 12/03/2015 15:27     Marquette SaaAbigail Joseph Lancaster, MD 12/04/2015, 12:57 PM PGY-2, [facility] FPTS Intern pager: [phone], text pages welcome

## 2015-12-04 NOTE — Evaluation (Signed)
Physical Therapy Evaluation Patient Details Name: Michelle Boyd MRN: [id number] DOB: 07/11/1950 Today's Date: 12/04/2015   History of Present Illness  Michelle Boyd is a 65 y.o. female presenting with headache. 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 As of January of this year, she requires 24 hour assist at home  Clinical Impression   Pt admitted with above diagnosis. Stroke workup underway; Pt currently with functional limitations due to the deficits listed below (see PT Problem List). Home will likely be the most therapeutic place for Michelle Boyd; Pt will benefit from skilled PT to increase their independence and safety with mobility to allow discharge to the venue listed below.       Follow Up Recommendations Home health PT;Supervision/Assistance - 24 hour Not sure that HHPT is covered by insurance   Equipment Recommendations  None recommended by PT    Recommendations for Other Services       Precautions / Restrictions Precautions Precautions: Fall      Mobility  Bed Mobility Overal bed mobility: Needs Assistance Bed Mobility: Supine to Sit     Supine to sit: Min assist     General bed mobility comments: (A) to elevated trunk. Family reports this is baseline  Transfers Overall transfer level: Needs assistance Equipment used: 1 person hand held assist Transfers: Sit to/from Stand Sit to Stand: Min assist         General transfer comment: min assist to power up and to steady initially  Ambulation/Gait Ambulation/Gait assistance: Min assist Ambulation Distance (Feet): 120 Feet Assistive device: 1 person hand held assist (and tendency to reach out for UE support with other hand) Gait Pattern/deviations: Step-through pattern;Decreased step length - right;Decreased step length - left     General Gait Details: Noted she tends to  reach out for UE support bilaterally; cues to self-monitor for activity tolerance  Stairs            Wheelchair Mobility    Modified Rankin (Stroke Patients Only)       Balance Overall balance assessment: Needs assistance   Sitting balance-Leahy Scale: Fair       Standing balance-Leahy Scale: Poor                               Pertinent Vitals/Pain Pain Assessment: Faces Faces Pain Scale: Hurts even more Pain Location: Headache Pain Descriptors / Indicators: Headache Pain Intervention(s): Monitored during session    Home Living Family/patient expects to be discharged to:: Private residence Living Arrangements: Other relatives Available Help at Discharge: Family;Available 24 hours/day Type of Home: House         Home Equipment: Walker - 4 wheels      Prior Function Level of Independence: Needs assistance   Gait / Transfers Assistance Needed: walks with Rollator  ADL's / Homemaking Assistance Needed: requires (A) for all bathing and dressing per family present        Hand Dominance   Dominant Hand: Right    Extremity/Trunk Assessment   Upper Extremity Assessment: Overall WFL for tasks assessed           Lower Extremity Assessment: Generalized weakness      Cervical / Trunk Assessment: Normal  Communication   Communication: Prefers language other than [place];Interpreter utilized (family present to interpret at this time)  Cognition Arousal/Alertness: Awake/alert Behavior During  Therapy: Flat affect Overall Cognitive Status: History of cognitive impairments - at baseline                      General Comments General comments (skin integrity, edema, etc.): Pt's family in and assisted with interpreting; Will arrange for an interpreter in ensuing sessions    Exercises     Assessment/Plan    PT Assessment Patient needs continued PT services  PT Problem List Decreased strength;Decreased activity tolerance;Decreased  balance;Decreased mobility;Decreased cognition;Decreased knowledge of use of DME;Pain          PT Treatment Interventions DME instruction;Gait training;Stair training;Functional mobility training;Therapeutic activities;Balance training;Therapeutic exercise;Neuromuscular re-education;Patient/family education;Cognitive remediation    PT Goals (Current goals can be found in the Care Plan section)  Acute Rehab PT Goals Patient Stated Goal: family requesting to take patient home PT Goal Formulation: With patient/family Time For Goal Achievement: 12/18/15 Potential to Achieve Goals: Good    Frequency Min 3X/week   Barriers to discharge   Given her current insurance, not sure that HHPT will be paid for    Co-evaluation PT/OT/SLP Co-Evaluation/Treatment: Yes Reason for Co-Treatment: Complexity of the patient's impairments (multi-system involvement) PT goals addressed during session: Mobility/safety with mobility OT goals addressed during session: ADL's and self-care;Strengthening/ROM       End of Session Equipment Utilized During Treatment: Gait belt Activity Tolerance: Patient tolerated treatment well Patient left: in chair;with call bell/phone within reach;with chair alarm set;with family/visitor present Nurse Communication: Mobility status    Functional Assessment Tool Used: Clinical Judgement Functional Limitation: Mobility: Walking and moving around Mobility: Walking and Moving Around Current Status [phone]): At least 20 percent but less than 40 percent impaired, limited or restricted Mobility: Walking and Moving Around Goal Status [phone]): 0 percent impaired, limited or restricted    Time: 0981-1914 PT Time Calculation (min) (ACUTE ONLY): 28 min   Charges:   PT Evaluation $PT Eval Moderate Complexity: 1 Procedure     PT G Codes:   PT G-Codes **NOT FOR INPATIENT CLASS** Functional Assessment Tool Used: Clinical Judgement Functional Limitation: Mobility: Walking and  moving around Mobility: Walking and Moving Around Current Status ([id number]): At least 20 percent but less than 40 percent impaired, limited or restricted Mobility: Walking and Moving Around Goal Status [phone]): 0 percent impaired, limited or restricted    Van Clines [facility] 12/04/2015, 4:58 PM  Van Clines, PT  Acute Rehabilitation Services Pager [phone] Office [phone]

## 2015-12-04 NOTE — Progress Notes (Addendum)
VASCULAR LAB PRELIMINARY  PRELIMINARY  PRELIMINARY  PRELIMINARY  VASCULAR LAB PRELIMINARY  PRELIMINARY  PRELIMINARY  PRELIMINARY  Carotid duplex completed.    Preliminary report:  1-39% ICA plaquing.  Vertebral artery flow is antegrade. Cannot rule out possible dissection of the distal left CCA versus plaque.  Ladena Jacquez, RVT 12/04/2015, 11:14 AM           Emmaly Leech, RVT 12/04/2015, 10:54 AM

## 2015-12-04 NOTE — Consult Note (Signed)
Hospital Consult    Reason for Consult:  Concern for left carotid dissecion Referring Physician:  Family medicine MRN #:  [id number]  History of Present Illness: This is a 65 y.o. female presented to hospital for right facial pain with associated dizziness. She has also apparently had right sided extremity symptoms. History is obtained via family as interpreters and seems inconsistent. Currently having Right facial pain but no extremity issues. No other complaints at this time. By report she does take aspirin and anti-htn medications.   Past Medical History:  Diagnosis Date  . Cholelithiasis   . Chronic knee pain   . Gallstone pancreatitis   . GERD (gastroesophageal reflux disease)   . Hypertension   . Memory loss     Past Surgical History:  Procedure Laterality Date  . CATARACT EXTRACTION  2016  . CHOLECYSTECTOMY N/A 10/04/2013   Procedure: LAPAROSCOPIC CHOLECYSTECTOMY WITH INTRAOPERATIVE CHOLANGIOGRAM;  Surgeon: Axel FillerArmando Ramirez, MD;  Location: [facility] OR;  Service: General;  Laterality: N/A;  . EXTERNAL EAR SURGERY  years ago   right mastoidectomy    Allergies  Allergen Reactions  . Feosol [Iron] Itching    Prior to Admission medications   Medication Sig Start Date End Date Taking? Authorizing Provider  acetaminophen (TYLENOL) 500 MG tablet Take 1 tablet (500 mg total) by mouth every 6 (six) hours as needed. 10/05/15  Yes Dan HumphreysMichael Irick, MD  amLODipine (NORVASC) 5 MG tablet Take 1 tablet (5 mg total) by mouth daily. 09/05/15  Yes Quentin Angstlugbemiga E Jegede, MD  aspirin EC 81 MG tablet Take 81 mg by mouth daily as needed for mild pain or moderate pain.   Yes Historical Provider, MD  hydrALAZINE (APRESOLINE) 50 MG tablet Take 1 tablet (50 mg total) by mouth 3 (three) times daily. 10/03/15  Yes Jaclyn ShaggyEnobong Amao, MD  ibuprofen (ADVIL,MOTRIN) 600 MG tablet Take 1 tablet (600 mg total) by mouth every 6 (six) hours as needed. 10/05/15  Yes Dan HumphreysMichael Irick, MD  lactulose (CHRONULAC) 10 GM/15ML solution  Take 15 mLs (10 g total) by mouth 3 (three) times daily. 10/18/15  Yes Jaclyn ShaggyEnobong Amao, MD  naproxen (NAPROSYN) 500 MG tablet Take 1 tablet (500 mg total) by mouth 2 (two) times daily with a meal. 10/03/15  Yes Jaclyn ShaggyEnobong Amao, MD  amoxicillin-clavulanate (AUGMENTIN) 875-125 MG tablet Take 1 tablet by mouth 2 (two) times daily. Patient not taking: Reported on 12/03/2015 09/05/15   Santiago GladHeather Laisure, PA-C    Social History   Social History  . Marital status: Divorced    Spouse name: N/A  . Number of children: N/A  . Years of education: ESL   Occupational History  .      na   Social History Main Topics  . Smoking status: Never Smoker  . Smokeless tobacco: Never Used  . Alcohol use No  . Drug use: No  . Sexual activity: No   Other Topics Concern  . Not on file   Social History Narrative   Lives at home with son, daughter-in-law   Caffeine use- coffee 3-4 cups daily, tea occas     Family History  Problem Relation Age of Onset  . Diabetes Father     ROS:  Difficult to obtain 2/2 language barriers GE:XBMWUXCV:Denies history of chest pain Pulm: does not have soa, wheezing Gi: No abdominal complaints, Bowel habits are unchanged HEENT: Currently no visual changes Extremities: working well without changes, numbness or weakness Gu: Able to urinate without issue Neuro: right sided weakness in the past  Physical Examination  Vitals:   12/04/15 0330 12/04/15 0530  BP: 116/62 120/66  Pulse: 70 70  Resp: 16 18  Temp: 97.6 F (36.4 C) 97.8 F (36.6 C)   Body mass index is 25.14 kg/m.  General:  NAD Gait: Not observed HENT: ttp R face Pulmonary: normal non-labored breathingg Cardio/Vascular: 2+ peripheral pulses Abdomen:  soft, NT/ND, no masses Extremities: no cce Neurologic: awake and alert, 5/5 strenght bue/ble   CBC    Component Value Date/Time   WBC 5.0 12/03/2015 1137   RBC 4.49 12/03/2015 1137   HGB 12.3 12/03/2015 1137   HCT 37.9 12/03/2015 1137   PLT 175 12/03/2015  1137   MCV 84.4 12/03/2015 1137   MCH 27.4 12/03/2015 1137   MCHC 32.5 12/03/2015 1137   RDW 14.2 12/03/2015 1137   LYMPHSABS 0.7 10/22/2013 0922   MONOABS 1.2 (H) 10/22/2013 0922   EOSABS 0.0 10/22/2013 0922   BASOSABS 0.0 10/22/2013 0922    BMET    Component Value Date/Time   NA 141 12/04/2015 0347   K 4.2 12/04/2015 0347   CL 108 12/04/2015 0347   CO2 24 12/04/2015 0347   GLUCOSE 83 12/04/2015 0347   BUN 15 12/04/2015 0347   CREATININE 0.90 12/04/2015 0347   CREATININE 0.79 06/06/2015 0916   CALCIUM 8.8 (L) 12/04/2015 0347   GFRNONAA >60 12/04/2015 0347   GFRAA >60 12/04/2015 0347    COAGS: Lab Results  Component Value Date   INR 1.06 10/02/2013     Non-Invasive Vascular Imaging:   Summary: Bilateral: intimal wall thickening CCA. Bilateral: 1-39% ICA plaquing. Vertebral artery flow is antegrade. Left: distal common carotid dissection versus plaque. Patient&'s hospitalist is ordering CTA of neck.    ASSESSMENT/PLAN: This is a 65 y.o. female with somewhat confusing R facial pain and reported history of neurologic symptoms of Right upper and lower extremity. There is a questionable dissection on duplex of carotids on the left that does not fit facial pain on the right. Have recommended CT angio of head and neck to evlaluate. If carotid dissection found would recommend full strength aspirin given lack of neuro symptom at this time.   Geno Sydnor C. Randie Heinz, MD [facility] Office: [phone] Pager: [phone]

## 2015-12-04 NOTE — Evaluation (Signed)
Occupational Therapy Evaluation Patient Details Name: Michelle Boyd MRN: [id number] DOB: [birth year] Today's Date: 12/04/2015    History of Present Illness Michelle Boyd is a 65 y.o. female presenting with headache. 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 As of January of this year, she requires 24 hour assist at home   Clinical Impression   PT admitted with workup underway for CVA . Pt currently with functional limitiations due to the deficits listed below (see OT problem list). PTA was (A)ed by family for adls at baseline per family.  Pt will benefit from skilled OT to increase their independence and safety with adls and balance to allow discharge home without OT follow up. OT to further assess vision with functional task.      Follow Up Recommendations  No OT follow up    Equipment Recommendations  None recommended by OT    Recommendations for Other Services       Precautions / Restrictions Precautions Precautions: Fall      Mobility Bed Mobility Overal bed mobility: Needs Assistance Bed Mobility: Supine to Sit     Supine to sit: Min assist     General bed mobility comments: (A) to elevated trunk. Family reports this is baseline  Transfers Overall transfer level: Needs assistance Equipment used: 1 person hand held assist Transfers: Sit to/from Stand Sit to Stand: Min assist              Balance                                            ADL Overall ADL's : Needs assistance/impaired     Grooming: Wash/dry hands;Min guard;Standing Grooming Details (indicate cue type and reason): cues for sequence and to wash bil UE                 Toilet Transfer: Minimal assistance           Functional mobility during ADLs: Minimal assistance General ADL Comments: pt requires hand held (A) this  time. Family reports patient being close to baseline for mobility     Vision Additional Comments: unable to assess fully due to need to have functional task to test further. Pt able to locate soap and towel sink surface   Perception     Praxis      Pertinent Vitals/Pain Pain Assessment: Faces Faces Pain Scale: Hurts even more Pain Location: head Pain Descriptors / Indicators: Headache Pain Intervention(s): Monitored during session;Premedicated before session;Repositioned     Hand Dominance Right   Extremity/Trunk Assessment Upper Extremity Assessment Upper Extremity Assessment: Overall WFL for tasks assessed   Lower Extremity Assessment Lower Extremity Assessment: Defer to PT evaluation   Cervical / Trunk Assessment Cervical / Trunk Assessment: Normal   Communication Communication Communication: Prefers language other than [place];Interpreter utilized (family present to interpret at this time)   Cognition Arousal/Alertness: Awake/alert Behavior During Therapy: Flat affect Overall Cognitive Status: History of cognitive impairments - at baseline                     General Comments       Exercises       Shoulder Instructions      Home Living Family/patient expects to be discharged to:: Private  residence Living Arrangements: Other relatives Available Help at Discharge: Family;Available 24 hours/day Type of Home: House             Bathroom Shower/Tub: Other (comment) (sponge bath)   Bathroom Toilet: Standard     Home Equipment: Walker - 4 wheels          Prior Functioning/Environment Level of Independence: Needs assistance  Gait / Transfers Assistance Needed: walks with Rollator ADL's / Homemaking Assistance Needed: requires (A) for all bathing and dressing per family present            OT Problem List: Decreased activity tolerance;Impaired balance (sitting and/or standing);Decreased safety awareness;Decreased knowledge of use of DME or  AE;Decreased knowledge of precautions;Decreased strength   OT Treatment/Interventions: Self-care/ADL training;Therapeutic exercise;DME and/or AE instruction;Therapeutic activities;Cognitive remediation/compensation;Visual/perceptual remediation/compensation;Patient/family education;Balance training    OT Goals(Current goals can be found in the care plan section) Acute Rehab OT Goals Patient Stated Goal: family requesting to take patient home OT Goal Formulation: With patient/family Time For Goal Achievement: 12/18/15 Potential to Achieve Goals: Good  OT Frequency: Min 2X/week   Barriers to D/C:            Co-evaluation PT/OT/SLP Co-Evaluation/Treatment: Yes Reason for Co-Treatment: Complexity of the patient's impairments (multi-system involvement)   OT goals addressed during session: ADL's and self-care;Strengthening/ROM      End of Session Equipment Utilized During Treatment: Gait belt Nurse Communication: Mobility status;Precautions  Activity Tolerance: Patient tolerated treatment well Patient left: in chair;with call bell/phone within reach;with chair alarm set;with family/visitor present   Time: [id number] OT Time Calculation (min): 37 min Charges:  OT General Charges $OT Visit: 1 Procedure OT Evaluation $OT Eval Moderate Complexity: 1 Procedure G-Codes:    [name], Ameenah Prosser B 12/04/2015, 3:46 PM  [name], [name]   OTR/L Pager: [phone] Office: [phone] .

## 2015-12-05 ENCOUNTER — Other Ambulatory Visit: Payer: Self-pay | Admitting: Family Medicine

## 2015-12-05 ENCOUNTER — Observation Stay (HOSPITAL_COMMUNITY): Payer: Medicaid Other

## 2015-12-05 DIAGNOSIS — R42 Dizziness and giddiness: Secondary | ICD-10-CM | POA: Diagnosis not present

## 2015-12-05 DIAGNOSIS — R200 Anesthesia of skin: Secondary | ICD-10-CM | POA: Diagnosis not present

## 2015-12-05 DIAGNOSIS — G459 Transient cerebral ischemic attack, unspecified: Secondary | ICD-10-CM

## 2015-12-05 DIAGNOSIS — R202 Paresthesia of skin: Secondary | ICD-10-CM | POA: Diagnosis not present

## 2015-12-05 DIAGNOSIS — I7771 Dissection of carotid artery: Secondary | ICD-10-CM

## 2015-12-05 DIAGNOSIS — R531 Weakness: Secondary | ICD-10-CM

## 2015-12-05 DIAGNOSIS — G43409 Hemiplegic migraine, not intractable, without status migrainosus: Secondary | ICD-10-CM

## 2015-12-05 DIAGNOSIS — G501 Atypical facial pain: Secondary | ICD-10-CM | POA: Diagnosis not present

## 2015-12-05 LAB — HEMOGLOBIN A1C
Hgb A1c MFr Bld: 5.4 % (ref 4.8–5.6)
MEAN PLASMA GLUCOSE: 108 mg/dL

## 2015-12-05 MED ORDER — PROPRANOLOL HCL 20 MG PO TABS: 20.0000 mg | ORAL_TABLET | Freq: Two times a day (BID) | ORAL | 0 refills | Status: DC

## 2015-12-05 MED ORDER — PROPRANOLOL HCL 10 MG PO TABS
20.0000 mg | ORAL_TABLET | Freq: Two times a day (BID) | ORAL | Status: DC
  Administered 2015-12-05: 20 mg via ORAL
  Filled 2015-12-05: qty 2

## 2015-12-05 NOTE — Care Management Note (Addendum)
Case Management Note  Patient Details  Name: Michelle Boyd Dial MRN: [id number] Date of Birth: 07/02/1950  Subjective/Objective:      Pt in with TIA. She is from home with relatives.               Action/Plan: PT recommending HH services. No f/u per OT. Medicaid will not cover home therapy for her diagnosis. CM following for d/c needs.  Expected Discharge Date:                  Expected Discharge Plan:  Home w Home Health Services  In-House Referral:     Discharge planning Services     Post Acute Care Choice:    Choice offered to:     DME Arranged:    DME Agency:     HH Arranged:    HH Agency:     Status of Service:  In process, will continue to follow  If discussed at Long Length of Stay Meetings, dates discussed:    Additional Comments:  Kermit BaloKelli F Caleel Kiner, RN 12/05/2015, 11:19 AM

## 2015-12-05 NOTE — Progress Notes (Signed)
  Progress Note    12/05/2015 8:55 AM * No surgery found *  Subjective:  Patient awake and alert today  Vitals:   12/05/15 0056 12/05/15 0514  BP: (!) 143/71 140/72  Pulse: 66 69  Resp: 20 20  Temp: 98.7 F (37.1 C) 98.5 F (36.9 C)    Physical Exam: Moving all 4 without neuro deficit  CBC    Component Value Date/Time   WBC 5.0 12/03/2015 1137   RBC 4.49 12/03/2015 1137   HGB 12.3 12/03/2015 1137   HCT 37.9 12/03/2015 1137   PLT 175 12/03/2015 1137   MCV 84.4 12/03/2015 1137   MCH 27.4 12/03/2015 1137   MCHC 32.5 12/03/2015 1137   RDW 14.2 12/03/2015 1137   LYMPHSABS 0.7 10/22/2013 0922   MONOABS 1.2 (H) 10/22/2013 0922   EOSABS 0.0 10/22/2013 0922   BASOSABS 0.0 10/22/2013 0922    BMET    Component Value Date/Time   NA 141 12/04/2015 0347   K 4.2 12/04/2015 0347   CL 108 12/04/2015 0347   CO2 24 12/04/2015 0347   GLUCOSE 83 12/04/2015 0347   BUN 15 12/04/2015 0347   CREATININE 0.90 12/04/2015 0347   CREATININE 0.79 06/06/2015 0916   CALCIUM 8.8 (L) 12/04/2015 0347   GFRNONAA >60 12/04/2015 0347   GFRAA >60 12/04/2015 0347    INR    Component Value Date/Time   INR 1.06 10/02/2013 0610     Intake/Output Summary (Last 24 hours) at 12/05/15 0855 Last data filed at 12/04/15 1700  Gross per 24 hour  Intake              300 ml  Output                0 ml  Net              300 ml     Assessment/Plan: 65yo female with nonspecific neuro findings and concern for carotid dissection that is not apparent on CTA. No vascular intervention needed. Does have a multinodular goiter that will need followed.    Jaquan Sadowsky C. Randie Heinzain, MD [facility] [place] [facility] Office: [phone] Pager: [phone]  12/05/2015 8:55 AM

## 2015-12-05 NOTE — Progress Notes (Signed)
Patient A/O, no note distress. Denies pain. Educated family on discharge instruction and follow up appointments, and medications. Staff transported patient down. Patient have all belongings and sign documents.

## 2015-12-05 NOTE — Progress Notes (Signed)
Family Medicine Teaching Service Daily Progress Note Intern Pager: [phone]  Patient name: Michelle Boyd Medical record number: [id number] Date of birth: 08/06/1950 Age: 65 y.o. Gender: female  Primary Care Provider: Jaclyn Shaggy, MD Consultants: Neuro, ophthalmology Code Status: FULL  Pt Overview and Major Events to Date:  10/14 Admitted to FPTS  Assessment and Plan: Rayelynn Masek is a 65 y.o. female presenting with headache. PMH is significant for HTN and GERD.  # Concern for carotid dissection Carotid duplex with concern for possible dissection of the distal left CCA vs plaque. Patient denying symptoms, however will proceed with CTA head and neck to rule out.  - CTA head/neck - showed no dissection  # TIA Presented to ED with frontal headache mostly on right side and right sided weakness/numbness. MRI with remote lacunar infarct in right basal ganglia, chronic microvascular ischemia, no abnormality to explain dizziness. Currently prescribed Aspirin 81mg  daily.Trop neg. Symptoms mostly resolved with some residual right facial and upper extremity numbness reported. Per neuro, sx unlikely due to cerebrovascular disease and minimal utility in proceeding with stroke w/u.  - Vitals per unit routine - Neuro checks Q4  - Hgb A1c ordered - c/s Neurology, appreciate assistance - Aspirin 81 mg daily - Plavix 300 mg initially, then 75 mg daily.  - PT/OT eval & treat --> PT rec HH PT however insurance will not cover.   #Hx migraines -history of multiple visits to the ED with migraine, given cocktail with improvement -discharge on migraine prophylaxis med --> propranolol 20 mg bid  # New murmur: Systolic, most prominent in R ICS. Physical exam most consistent with aortic stenosis, however will proceed with echo.  - F/u echocardiogram - Surgery consulted - will see patient. Appreciate recs.   #Blurry vision History of L eye blurriness for the past year. Had cataract surgery on right eye. Vision  worse than 20/200 in R eye on exam today, ~20/100 on L and with both eyes open. -C/s ophthalmology, appreciate recs  # Dizziness Positive modified Dix Hallpike today. Differential includes BPPV. No other etiology identified thus far.  - Continue to monitor  # Hypertension Home medication includes Amlodipine 5mg , Hydralazine 50mg  three times daily. BP 120/66 this AM.  - Hold home antihypertensives - Permissible hypertension -Discontinue Hydralazine on discharge -Discharge on Amlodipine 5 mg and Propranolol 20 mg BID  # Hypokalemia Potassium of 3.3 noted at admission. S/p KDur x2. Improved to 4.2 this AM.  - Continue to monitor  FEN/GI: Heart healthy Prophylaxis: Lovenox  Disposition: Home pending Echo  Subjective:  Patient states headache has resolved.  No longer feeling dizzy.  Asking when can be discharged and discussed that we are waiting for echo results.  Patient understands.   Objective: Temp:  [98.1 F (36.7 C)-99.6 F (37.6 C)] 98.1 F (36.7 C) (10/16 1008) Pulse Rate:  [62-72] 72 (10/16 1008) Resp:  [18-20] 20 (10/16 1008) BP: (134-144)/(63-74) 134/63 (10/16 1008) SpO2:  [93 %-96 %] 96 % (10/16 0514) Physical Exam: General: elderly female lying in bed in NAD Eyes: significantly decreased vision due to cataracts Cardiovascular: RRR, systolic murmur noted most prominent in R ICS Respiratory: CTAB, no wheezes, normal WOB on RA Abdomen: soft, non-tender, non-distended, no masses +BS Neuro: positive modified Dix-Hallpike maneuver, no focal neuro deficits, diminished sensation over right cheek and right forearm,  A&Ox3 Psych: pleasant, appropriate mood and affect  Laboratory:  Recent Labs Lab 12/03/15 1137  WBC 5.0  HGB 12.3  HCT 37.9  PLT 175  Recent Labs Lab 12/03/15 1137 12/04/15 0347  NA 138 141  K 3.3* 4.2  CL 106 108  CO2 24 24  BUN 14 15  CREATININE 0.79 0.90  CALCIUM 9.2 8.8*  GLUCOSE 93 83   Imaging/Diagnostic Tests: Ct Angio Head W  Or Wo Contrast  Result Date: 12/05/2015 CLINICAL DATA:  65 y/o F; question of distal left common carotid artery dissection on carotid duplex. EXAM: CT ANGIOGRAPHY HEAD AND NECK TECHNIQUE: Multidetector CT imaging of the head and neck was performed using the standard protocol during bolus administration of intravenous contrast. Multiplanar CT image reconstructions and MIPs were obtained to evaluate the vascular anatomy. Carotid stenosis measurements (when applicable) are obtained utilizing NASCET criteria, using the distal internal carotid diameter as the denominator. CONTRAST:  75 cc Isovue 370 COMPARISON:  12/03/2015 MRI of the brain and 10/05/2015 CT head FINDINGS: CT HEAD FINDINGS Brain: No evidence of acute infarction, hemorrhage, hydrocephalus, extra-axial collection or mass lesion/mass effect. Chronic right lentiform nucleus lacunar infarct. Nonspecific foci of hypoattenuation in subcortical and periventricular white matter are stable in comparison with FLAIR signal abnormality on prior MRI and consistent with mild chronic microvascular ischemic changes. Vascular: See below Skull: Normal. Negative for fracture or focal lesion. Sinuses: Imaged portions are clear. Orbits: No acute finding.  Right intra-ocular lens replacement Review of the MIP images confirms the above findings CTA NECK FINDINGS Aortic arch: Atherosclerosis with mild calcifications. Bovine arch, normal variant. No significant stenosis of great vessel origins. Right carotid system: No evidence of dissection, stenosis (50% or greater) or occlusion. Mild calcific atherosclerosis of the bifurcation. Left carotid system: Short segment of mild narrowing of the left common carotid artery (series 9, image 115) with mild eccentric thickening of the wall. No intimal flap. Otherwise no evidence of dissection, stenosis (50% or greater) or occlusion. Mild calcific atherosclerosis of the bifurcation. Vertebral arteries: Codominant. No evidence of  dissection, stenosis (50% or greater) or occlusion. Skeleton: Straightening of cervical lordosis without listhesis. No acute osseous abnormality is identified. Mild cervical spondylosis greatest at the C5-6 level where there are mild endplate degenerative changes. Other neck: Multinodular thyroid goiter. No lymphadenopathy or discrete cervical mass is identified. Patent aerodigestive tract. Upper chest: Mild mosaic attenuation probably represents air trapping from expiratory acquisition. Review of the MIP images confirms the above findings CTA HEAD FINDINGS Anterior circulation: No significant stenosis, proximal occlusion, aneurysm, or vascular malformation. Mild calcific atherosclerosis of the carotid siphons bilaterally. Posterior circulation: No significant stenosis, proximal occlusion, aneurysm, or vascular malformation. Small caliber vertebrobasilar system due to bilateral fetal PCA circulation. Venous sinuses: As permitted by contrast timing, patent. Anatomic variants: Bilateral fetal PCA and small patent anterior communicating artery. Delayed phase: No abnormal intracranial enhancement. Review of the MIP images confirms the above findings IMPRESSION: 1. No acute intracranial abnormality or abnormal enhancement of the brain identified. Chronic lacunar infarct of the right basal ganglia. 2. Short segment of mild narrowing of left common carotid artery with mild eccentric wall thickening possibly corresponding to lesion on carotid duplex. There is no intimal flap to suggest dissection. Focal intimal injury or intramural hematoma is possible but fibrofatty plaque is favored. 3. Otherwise unremarkable angiogram of the head and neck with mild atherosclerotic changes. 4. Multinodular thyroid goiter, further characterization with ultrasound is recommended on a nonemergent basis. Electronically Signed   By: Mitzi HansenLance  Furusawa-Stratton M.D.   On: 12/05/2015 04:33   Ct Angio Neck W Or Wo Contrast  Result Date:  12/05/2015 CLINICAL DATA:  65 y/o  F; question of distal left common carotid artery dissection on carotid duplex. EXAM: CT ANGIOGRAPHY HEAD AND NECK TECHNIQUE: Multidetector CT imaging of the head and neck was performed using the standard protocol during bolus administration of intravenous contrast. Multiplanar CT image reconstructions and MIPs were obtained to evaluate the vascular anatomy. Carotid stenosis measurements (when applicable) are obtained utilizing NASCET criteria, using the distal internal carotid diameter as the denominator. CONTRAST:  75 cc Isovue 370 COMPARISON:  12/03/2015 MRI of the brain and 10/05/2015 CT head FINDINGS: CT HEAD FINDINGS Brain: No evidence of acute infarction, hemorrhage, hydrocephalus, extra-axial collection or mass lesion/mass effect. Chronic right lentiform nucleus lacunar infarct. Nonspecific foci of hypoattenuation in subcortical and periventricular white matter are stable in comparison with FLAIR signal abnormality on prior MRI and consistent with mild chronic microvascular ischemic changes. Vascular: See below Skull: Normal. Negative for fracture or focal lesion. Sinuses: Imaged portions are clear. Orbits: No acute finding.  Right intra-ocular lens replacement Review of the MIP images confirms the above findings CTA NECK FINDINGS Aortic arch: Atherosclerosis with mild calcifications. Bovine arch, normal variant. No significant stenosis of great vessel origins. Right carotid system: No evidence of dissection, stenosis (50% or greater) or occlusion. Mild calcific atherosclerosis of the bifurcation. Left carotid system: Short segment of mild narrowing of the left common carotid artery (series 9, image 115) with mild eccentric thickening of the wall. No intimal flap. Otherwise no evidence of dissection, stenosis (50% or greater) or occlusion. Mild calcific atherosclerosis of the bifurcation. Vertebral arteries: Codominant. No evidence of dissection, stenosis (50% or greater) or  occlusion. Skeleton: Straightening of cervical lordosis without listhesis. No acute osseous abnormality is identified. Mild cervical spondylosis greatest at the C5-6 level where there are mild endplate degenerative changes. Other neck: Multinodular thyroid goiter. No lymphadenopathy or discrete cervical mass is identified. Patent aerodigestive tract. Upper chest: Mild mosaic attenuation probably represents air trapping from expiratory acquisition. Review of the MIP images confirms the above findings CTA HEAD FINDINGS Anterior circulation: No significant stenosis, proximal occlusion, aneurysm, or vascular malformation. Mild calcific atherosclerosis of the carotid siphons bilaterally. Posterior circulation: No significant stenosis, proximal occlusion, aneurysm, or vascular malformation. Small caliber vertebrobasilar system due to bilateral fetal PCA circulation. Venous sinuses: As permitted by contrast timing, patent. Anatomic variants: Bilateral fetal PCA and small patent anterior communicating artery. Delayed phase: No abnormal intracranial enhancement. Review of the MIP images confirms the above findings IMPRESSION: 1. No acute intracranial abnormality or abnormal enhancement of the brain identified. Chronic lacunar infarct of the right basal ganglia. 2. Short segment of mild narrowing of left common carotid artery with mild eccentric wall thickening possibly corresponding to lesion on carotid duplex. There is no intimal flap to suggest dissection. Focal intimal injury or intramural hematoma is possible but fibrofatty plaque is favored. 3. Otherwise unremarkable angiogram of the head and neck with mild atherosclerotic changes. 4. Multinodular thyroid goiter, further characterization with ultrasound is recommended on a nonemergent basis. Electronically Signed   By: Mitzi Hansen M.D.   On: 12/05/2015 04:33   Freddrick March, MD 12/05/2015, 1:27 PM PGY-1, [place] [facility] FPTS Intern pager:  [phone], text pages welcome

## 2015-12-08 NOTE — Discharge Summary (Signed)
Family Medicine [facility] Discharge Summary  Patient name: Michelle Boyd Medical record number: [id number] Date of birth: 1950-05-26 Age: 65 y.o. Gender: female Date of Admission: 12/03/2015  Date of Discharge: 12/05/2015 Admitting Physician: Uvaldo Rising, MD  Primary Care Provider: Jaclyn Shaggy, MD Consultants: [facility]  Indication for Hospitalization: Headache  Discharge Diagnoses/Problem List:  Patient Active Problem List   Diagnosis Date Noted  . Dissection of carotid artery (HCC)   . Right sided weakness   . Hemiplegic migraine without status migrainosus, not intractable   . Right facial numbness   . Right arm numbness   . Right leg numbness   . Systolic murmur   . Dizziness   . Transient cerebral ischemia 12/03/2015  . Bilateral chronic knee pain 11/11/2014  . AKI (acute kidney injury) (HCC) 10/22/2013  . Sepsis secondary to UTI (HCC) 10/22/2013  . Acute renal failure (HCC) 10/22/2013  . Pyelonephritis, acute 10/22/2013  . Iron (Fe) deficiency anemia 10/22/2013  . Elevated LFTs 10/22/2013  . Microcytic anemia 10/05/2013  . Cholecystitis, acute 10/04/2013  . Other pancytopenia (HCC) 10/03/2013  . Gallstone pancreatitis 10/02/2013  . Gallstones 09/29/2013  . Non-traumatic compression fracture of L3 & L4 lumbar vertebra 08/24/2013  . Right knee pain 11/07/2012  . Hypertension 05/26/2012  . Hypokalemia 05/26/2012   Disposition: Home  Discharge Condition: Stable  Discharge Exam:  General: elderly female lying in bed in NAD Eyes: significantly decreased vision due to cataracts Cardiovascular: RRR, systolic murmur noted most prominent in R ICS Respiratory: CTAB, no wheezes, normal WOB on RA Abdomen: soft, non-tender, non-distended, no masses +BS Neuro: A&Ox3, positive modified Dix-Hallpike maneuver, no focal neuro deficits, diminished sensation over right cheek and right forearm Psych: pleasant, appropriate mood and affect  Brief Hospital Course:  65  Yo  F with past medical history significant for HTN and GERD, bilateral cataracts presents to ED with headache.  Trop negative.  Afebrile, no white count, headache localized mostly to frontal aspect with right sided weakness/numbness of RUE.  Diminished sensation over right cheek and right forearm.  Neuro exam otherwise largely normal.  Patient with carotid duplex concerning for possible carotid dissection of distal left CCA vs plaque.  CTA head and neck was negative for dissection. MRI with remote lacunar infarct in right basal ganglia, chronic microvascular ischemia, no abnormality to explain dizziness Patient started on Aspirin 81 mg daily and Plavix 300 mg initially, then 75 mg daily.  Neurology consulted for focal deficits and per neuro, unlikely due to cerebrovascular disease with minimal utility in proceeding with stroke workup. Echo obtained to evaluate new systolic murmur.  Showing  EF 55-60%.   Over the next several days, headache improved and patient was discharged home with propranolol 20 mg bid for migraine prophylaxis.  PT recommended HH PT however patient's insurance unable to cover it.  Patient's family suggested will follow up with outpatient if possible.  Issues for Follow Up:  1. Prophylactic propranolol started for migraines 2. BP check  Significant Procedures: None  Significant Labs and Imaging:   Recent Labs Lab 12/03/15 1137  WBC 5.0  HGB 12.3  HCT 37.9  PLT 175    Recent Labs Lab 12/03/15 1137 12/04/15 0347  NA 138 141  K 3.3* 4.2  CL 106 108  CO2 24 24  GLUCOSE 93 83  BUN 14 15  CREATININE 0.79 0.90  CALCIUM 9.2 8.8*   Ct Angio Head W Or Wo Contrast  Result Date: 12/05/2015 CLINICAL DATA:  65 y/o  F; question of distal left common carotid artery dissection on carotid duplex. EXAM: CT ANGIOGRAPHY HEAD AND NECK TECHNIQUE: Multidetector CT imaging of the head and neck was performed using the standard protocol during bolus administration of intravenous contrast.  Multiplanar CT image reconstructions and MIPs were obtained to evaluate the vascular anatomy. Carotid stenosis measurements (when applicable) are obtained utilizing NASCET criteria, using the distal internal carotid diameter as the denominator. CONTRAST:  75 cc Isovue 370 COMPARISON:  12/03/2015 MRI of the brain and 10/05/2015 CT head FINDINGS: CT HEAD FINDINGS Brain: No evidence of acute infarction, hemorrhage, hydrocephalus, extra-axial collection or mass lesion/mass effect. Chronic right lentiform nucleus lacunar infarct. Nonspecific foci of hypoattenuation in subcortical and periventricular white matter are stable in comparison with FLAIR signal abnormality on prior MRI and consistent with mild chronic microvascular ischemic changes. Vascular: See below Skull: Normal. Negative for fracture or focal lesion. Sinuses: Imaged portions are clear. Orbits: No acute finding.  Right intra-ocular lens replacement Review of the MIP images confirms the above findings CTA NECK FINDINGS Aortic arch: Atherosclerosis with mild calcifications. Bovine arch, normal variant. No significant stenosis of great vessel origins. Right carotid system: No evidence of dissection, stenosis (50% or greater) or occlusion. Mild calcific atherosclerosis of the bifurcation. Left carotid system: Short segment of mild narrowing of the left common carotid artery (series 9, image 115) with mild eccentric thickening of the wall. No intimal flap. Otherwise no evidence of dissection, stenosis (50% or greater) or occlusion. Mild calcific atherosclerosis of the bifurcation. Vertebral arteries: Codominant. No evidence of dissection, stenosis (50% or greater) or occlusion. Skeleton: Straightening of cervical lordosis without listhesis. No acute osseous abnormality is identified. Mild cervical spondylosis greatest at the C5-6 level where there are mild endplate degenerative changes. Other neck: Multinodular thyroid goiter. No lymphadenopathy or discrete  cervical mass is identified. Patent aerodigestive tract. Upper chest: Mild mosaic attenuation probably represents air trapping from expiratory acquisition. Review of the MIP images confirms the above findings CTA HEAD FINDINGS Anterior circulation: No significant stenosis, proximal occlusion, aneurysm, or vascular malformation. Mild calcific atherosclerosis of the carotid siphons bilaterally. Posterior circulation: No significant stenosis, proximal occlusion, aneurysm, or vascular malformation. Small caliber vertebrobasilar system due to bilateral fetal PCA circulation. Venous sinuses: As permitted by contrast timing, patent. Anatomic variants: Bilateral fetal PCA and small patent anterior communicating artery. Delayed phase: No abnormal intracranial enhancement. Review of the MIP images confirms the above findings IMPRESSION: 1. No acute intracranial abnormality or abnormal enhancement of the brain identified. Chronic lacunar infarct of the right basal ganglia. 2. Short segment of mild narrowing of left common carotid artery with mild eccentric wall thickening possibly corresponding to lesion on carotid duplex. There is no intimal flap to suggest dissection. Focal intimal injury or intramural hematoma is possible but fibrofatty plaque is favored. 3. Otherwise unremarkable angiogram of the head and neck with mild atherosclerotic changes. 4. Multinodular thyroid goiter, further characterization with ultrasound is recommended on a nonemergent basis. Electronically Signed   By: Mitzi HansenLance  Furusawa-Stratton M.D.   On: 12/05/2015 04:33   Ct Angio Neck W Or Wo Contrast  Result Date: 12/05/2015 CLINICAL DATA:  65 y/o F; question of distal left common carotid artery dissection on carotid duplex. EXAM: CT ANGIOGRAPHY HEAD AND NECK TECHNIQUE: Multidetector CT imaging of the head and neck was performed using the standard protocol during bolus administration of intravenous contrast. Multiplanar CT image reconstructions and  MIPs were obtained to evaluate the vascular anatomy. Carotid stenosis measurements (when applicable) are obtained  utilizing NASCET criteria, using the distal internal carotid diameter as the denominator. CONTRAST:  75 cc Isovue 370 COMPARISON:  12/03/2015 MRI of the brain and 10/05/2015 CT head FINDINGS: CT HEAD FINDINGS Brain: No evidence of acute infarction, hemorrhage, hydrocephalus, extra-axial collection or mass lesion/mass effect. Chronic right lentiform nucleus lacunar infarct. Nonspecific foci of hypoattenuation in subcortical and periventricular white matter are stable in comparison with FLAIR signal abnormality on prior MRI and consistent with mild chronic microvascular ischemic changes. Vascular: See below Skull: Normal. Negative for fracture or focal lesion. Sinuses: Imaged portions are clear. Orbits: No acute finding.  Right intra-ocular lens replacement Review of the MIP images confirms the above findings CTA NECK FINDINGS Aortic arch: Atherosclerosis with mild calcifications. Bovine arch, normal variant. No significant stenosis of great vessel origins. Right carotid system: No evidence of dissection, stenosis (50% or greater) or occlusion. Mild calcific atherosclerosis of the bifurcation. Left carotid system: Short segment of mild narrowing of the left common carotid artery (series 9, image 115) with mild eccentric thickening of the wall. No intimal flap. Otherwise no evidence of dissection, stenosis (50% or greater) or occlusion. Mild calcific atherosclerosis of the bifurcation. Vertebral arteries: Codominant. No evidence of dissection, stenosis (50% or greater) or occlusion. Skeleton: Straightening of cervical lordosis without listhesis. No acute osseous abnormality is identified. Mild cervical spondylosis greatest at the C5-6 level where there are mild endplate degenerative changes. Other neck: Multinodular thyroid goiter. No lymphadenopathy or discrete cervical mass is identified. Patent  aerodigestive tract. Upper chest: Mild mosaic attenuation probably represents air trapping from expiratory acquisition. Review of the MIP images confirms the above findings CTA HEAD FINDINGS Anterior circulation: No significant stenosis, proximal occlusion, aneurysm, or vascular malformation. Mild calcific atherosclerosis of the carotid siphons bilaterally. Posterior circulation: No significant stenosis, proximal occlusion, aneurysm, or vascular malformation. Small caliber vertebrobasilar system due to bilateral fetal PCA circulation. Venous sinuses: As permitted by contrast timing, patent. Anatomic variants: Bilateral fetal PCA and small patent anterior communicating artery. Delayed phase: No abnormal intracranial enhancement. Review of the MIP images confirms the above findings IMPRESSION: 1. No acute intracranial abnormality or abnormal enhancement of the brain identified. Chronic lacunar infarct of the right basal ganglia. 2. Short segment of mild narrowing of left common carotid artery with mild eccentric wall thickening possibly corresponding to lesion on carotid duplex. There is no intimal flap to suggest dissection. Focal intimal injury or intramural hematoma is possible but fibrofatty plaque is favored. 3. Otherwise unremarkable angiogram of the head and neck with mild atherosclerotic changes. 4. Multinodular thyroid goiter, further characterization with ultrasound is recommended on a nonemergent basis. Electronically Signed   By: Mitzi Hansen M.D.   On: 12/05/2015 04:33   Results/Tests Pending at Time of Discharge: None  Discharge Medications:    Medication List    STOP taking these medications   amLODipine 5 MG tablet Commonly known as:  NORVASC   amoxicillin-clavulanate 875-125 MG tablet Commonly known as:  AUGMENTIN   hydrALAZINE 50 MG tablet Commonly known as:  APRESOLINE     TAKE these medications   acetaminophen 500 MG tablet Commonly known as:  TYLENOL Take 1 tablet  (500 mg total) by mouth every 6 (six) hours as needed.   aspirin EC 81 MG tablet Take 81 mg by mouth daily as needed for mild pain or moderate pain.   ibuprofen 600 MG tablet Commonly known as:  ADVIL,MOTRIN Take 1 tablet (600 mg total) by mouth every 6 (six) hours as needed.  lactulose 10 GM/15ML solution Commonly known as:  CHRONULAC Take 15 mLs (10 g total) by mouth 3 (three) times daily.   naproxen 500 MG tablet Commonly known as:  NAPROSYN Take 1 tablet (500 mg total) by mouth 2 (two) times daily with a meal.   propranolol 20 MG tablet Commonly known as:  INDERAL Take 1 tablet (20 mg total) by mouth 2 (two) times daily.      Discharge Instructions: Please refer to Patient Instructions section of EMR for full details.  Patient was counseled important signs and symptoms that should prompt return to medical care, changes in medications, dietary instructions, activity restrictions, and follow up appointments.   Follow-Up Appointments: Follow-up Information    Jaclyn Shaggy, MD .   Specialty:  Family Medicine Contact information: [address] [place] [address] [phone]          Freddrick March, MD 12/08/2015, 3:40 PM PGY-1, [facility]

## 2015-12-27 ENCOUNTER — Encounter: Payer: Self-pay | Admitting: Family Medicine

## 2015-12-27 ENCOUNTER — Ambulatory Visit: Payer: Medicaid Other | Attending: Family Medicine | Admitting: Family Medicine

## 2015-12-27 VITALS — BP 157/69 | HR 65 | Temp 98.0°F | Ht 58.5 in | Wt 124.2 lb

## 2015-12-27 DIAGNOSIS — G8929 Other chronic pain: Secondary | ICD-10-CM | POA: Diagnosis not present

## 2015-12-27 DIAGNOSIS — Z8673 Personal history of transient ischemic attack (TIA), and cerebral infarction without residual deficits: Secondary | ICD-10-CM | POA: Diagnosis not present

## 2015-12-27 DIAGNOSIS — Z23 Encounter for immunization: Secondary | ICD-10-CM | POA: Diagnosis not present

## 2015-12-27 DIAGNOSIS — M25569 Pain in unspecified knee: Secondary | ICD-10-CM | POA: Insufficient documentation

## 2015-12-27 DIAGNOSIS — R509 Fever, unspecified: Secondary | ICD-10-CM | POA: Insufficient documentation

## 2015-12-27 DIAGNOSIS — R011 Cardiac murmur, unspecified: Secondary | ICD-10-CM | POA: Insufficient documentation

## 2015-12-27 DIAGNOSIS — G458 Other transient cerebral ischemic attacks and related syndromes: Secondary | ICD-10-CM | POA: Diagnosis not present

## 2015-12-27 DIAGNOSIS — Z7982 Long term (current) use of aspirin: Secondary | ICD-10-CM | POA: Insufficient documentation

## 2015-12-27 DIAGNOSIS — I10 Essential (primary) hypertension: Secondary | ICD-10-CM | POA: Diagnosis not present

## 2015-12-27 DIAGNOSIS — H578 Other specified disorders of eye and adnexa: Secondary | ICD-10-CM | POA: Diagnosis present

## 2015-12-27 DIAGNOSIS — Z9049 Acquired absence of other specified parts of digestive tract: Secondary | ICD-10-CM | POA: Diagnosis not present

## 2015-12-27 DIAGNOSIS — R5383 Other fatigue: Secondary | ICD-10-CM | POA: Diagnosis not present

## 2015-12-27 DIAGNOSIS — Z79899 Other long term (current) drug therapy: Secondary | ICD-10-CM | POA: Diagnosis not present

## 2015-12-27 DIAGNOSIS — R51 Headache: Secondary | ICD-10-CM | POA: Diagnosis not present

## 2015-12-27 DIAGNOSIS — Z9889 Other specified postprocedural states: Secondary | ICD-10-CM | POA: Insufficient documentation

## 2015-12-27 DIAGNOSIS — E042 Nontoxic multinodular goiter: Secondary | ICD-10-CM | POA: Insufficient documentation

## 2015-12-27 DIAGNOSIS — K219 Gastro-esophageal reflux disease without esophagitis: Secondary | ICD-10-CM | POA: Diagnosis not present

## 2015-12-27 DIAGNOSIS — Z888 Allergy status to other drugs, medicaments and biological substances status: Secondary | ICD-10-CM | POA: Insufficient documentation

## 2015-12-27 MED ORDER — AMLODIPINE BESYLATE 5 MG PO TABS: 5.0000 mg | ORAL_TABLET | Freq: Every day | ORAL | 5 refills | Status: DC

## 2015-12-27 MED ORDER — HYDRALAZINE HCL 50 MG PO TABS: 50.0000 mg | ORAL_TABLET | Freq: Three times a day (TID) | ORAL | 5 refills | Status: DC

## 2015-12-27 NOTE — Progress Notes (Signed)
Subjective:  Patient ID: Michelle Boyd, female    DOB: 07/19/1950  Age: 65 y.o. MRN: [id number]  CC: Hypertension; Fever (last week x's 3 days); eyes itching; and Fatigue   HPI Michelle Boyd is a 65 year old female with a history of Hypertension, GERD who presents today accompanied by her son for a follow up from hospitalization where she was managed for a transient ischemic attack From 12/03/15 through 12/05/15 [facility].  She presented with headaches, right sided sensory loss and right sided weakness carotid duplex was concerning for possible carotid dissection of distal left CCA vs plaque.  CTA head and neck was negative for dissection. MRI with remote lacunar infarct in right basal ganglia, chronic microvascular ischemia. Patient was started on Aspirin 81 mg daily and Plavix 300 mg initially, then 75 mg daily which was subsequently discontinued.  Neurology consulted for focal deficits and per neuro, unlikely due to cerebrovascular disease with minimal benefit in proceeding with stroke workup. 2 d Echo obtained to evaluate new systolic murmur Showing  EF 55-60% She was subsequently commenced on propranolol for migraine prophylaxis and then discharged.  Today she denies weakness or loss of sensation.  Accompanied by a representative from immigration attorney's office wanting to discuss immigration paperwork.  Past Medical History:  Diagnosis Date  . Cholelithiasis   . Chronic knee pain   . Gallstone pancreatitis   . GERD (gastroesophageal reflux disease)   . Hypertension   . Memory loss     Past Surgical History:  Procedure Laterality Date  . CATARACT EXTRACTION  2016  . CHOLECYSTECTOMY N/A 10/04/2013   Procedure: LAPAROSCOPIC CHOLECYSTECTOMY WITH INTRAOPERATIVE CHOLANGIOGRAM;  Surgeon: Axel FillerArmando Ramirez, MD;  Location: [facility] OR;  Service: General;  Laterality: N/A;  . EXTERNAL EAR SURGERY  years ago   right mastoidectomy    Allergies  Allergen Reactions  . Feosol [Iron]  Itching     Outpatient Medications Prior to Visit  Medication Sig Dispense Refill  . acetaminophen (TYLENOL) 500 MG tablet Take 1 tablet (500 mg total) by mouth every 6 (six) hours as needed. 30 tablet 0  . ibuprofen (ADVIL,MOTRIN) 600 MG tablet Take 1 tablet (600 mg total) by mouth every 6 (six) hours as needed. 30 tablet 0  . lactulose (CHRONULAC) 10 GM/15ML solution Take 15 mLs (10 g total) by mouth 3 (three) times daily. 946 mL 1  . aspirin EC 81 MG tablet Take 81 mg by mouth daily as needed for mild pain or moderate pain.    . naproxen (NAPROSYN) 500 MG tablet Take 1 tablet (500 mg total) by mouth 2 (two) times daily with a meal. (Patient not taking: Reported on 12/27/2015) 30 tablet 0  . propranolol (INDERAL) 20 MG tablet Take 1 tablet (20 mg total) by mouth 2 (two) times daily. 60 tablet 0   No facility-administered medications prior to visit.     ROS Review of Systems  Constitutional: Negative for activity change, appetite change and fatigue.  HENT: Negative for congestion, sinus pressure and sore throat.   Eyes: Negative for visual disturbance.  Respiratory: Negative for cough, chest tightness, shortness of breath and wheezing.   Cardiovascular: Negative for chest pain and palpitations.  Gastrointestinal: Negative for abdominal distention, abdominal pain and constipation.  Endocrine: Negative for polydipsia.  Genitourinary: Negative for dysuria and frequency.  Musculoskeletal: Negative for arthralgias and back pain.  Skin: Negative for rash.  Neurological: Negative for tremors, light-headedness and numbness.  Hematological: Does not bruise/bleed easily.  Psychiatric/Behavioral:  Negative for agitation and behavioral problems.    Objective:  BP (!) 157/69 (BP Location: Right Arm, Patient Position: Sitting, Cuff Size: Small)   Pulse 65   Temp 98 F (36.7 C) (Oral)   Ht 4' 10.5" (1.486 m)   Wt 124 lb 3.2 oz (56.3 kg)   SpO2 98%   BMI 25.52 kg/m   BP/Weight 12/27/2015  12/05/2015 12/03/2015  Systolic BP 157 128 -  Diastolic BP 69 70 -  Wt. (Lbs) 124.2 - 137.44  BMI 25.52 - 25.14     Physical Exam  Constitutional: She is oriented to person, place, and time. She appears well-developed and well-nourished.  Cardiovascular: Normal rate and intact distal pulses.   Murmur (2/6 systolic murmur) heard. Pulmonary/Chest: Effort normal and breath sounds normal. She has no wheezes. She has no rales. She exhibits no tenderness.  Abdominal: Soft. Bowel sounds are normal. She exhibits no distension and no mass. There is no tenderness.  Musculoskeletal: Normal range of motion.  Neurological: She is alert and oriented to person, place, and time.  Skin: Skin is warm and dry.  Psychiatric: She has a normal mood and affect.      CMP Latest Ref Rng & Units 12/04/2015 12/03/2015 10/05/2015  Glucose 65 - 99 mg/dL 83 93 829(F)  BUN 6 - 20 mg/dL 15 14 8   Creatinine 0.44 - 1.00 mg/dL 6.21 3.08 6.57  Sodium 135 - 145 mmol/L 141 138 141  Potassium 3.5 - 5.1 mmol/L 4.2 3.3(L) 3.6  Chloride 101 - 111 mmol/L 108 106 108  CO2 22 - 32 mmol/L 24 24 24   Calcium 8.9 - 10.3 mg/dL 8.4(O) 9.2 9.6  Total Protein 6.0 - 8.3 g/dL - - -  Total Bilirubin 0.3 - 1.2 mg/dL - - -  Alkaline Phos 39 - 117 U/L - - -  AST 0 - 37 U/L - - -  ALT 0 - 35 U/L - - -    CLINICAL DATA:  65 y/o F; question of distal left common carotid artery dissection on carotid duplex.  EXAM: CT ANGIOGRAPHY HEAD AND NECK  TECHNIQUE: Multidetector CT imaging of the head and neck was performed using the standard protocol during bolus administration of intravenous contrast. Multiplanar CT image reconstructions and MIPs were obtained to evaluate the vascular anatomy. Carotid stenosis measurements (when applicable) are obtained utilizing NASCET criteria, using the distal internal carotid diameter as the denominator.  CONTRAST:  75 cc Isovue 370  COMPARISON:  12/03/2015 MRI of the brain and 10/05/2015 CT  head  FINDINGS: CT HEAD FINDINGS  Brain: No evidence of acute infarction, hemorrhage, hydrocephalus, extra-axial collection or mass lesion/mass effect. Chronic right lentiform nucleus lacunar infarct. Nonspecific foci of hypoattenuation in subcortical and periventricular white matter are stable in comparison with FLAIR signal abnormality on prior MRI and consistent with mild chronic microvascular ischemic changes.  Vascular: See below  Skull: Normal. Negative for fracture or focal lesion.  Sinuses: Imaged portions are clear.  Orbits: No acute finding.  Right intra-ocular lens replacement  Review of the MIP images confirms the above findings  CTA NECK FINDINGS  Aortic arch: Atherosclerosis with mild calcifications. Bovine arch, normal variant. No significant stenosis of great vessel origins.  Right carotid system: No evidence of dissection, stenosis (50% or greater) or occlusion. Mild calcific atherosclerosis of the bifurcation.  Left carotid system: Short segment of mild narrowing of the left common carotid artery (series 9, image 115) with mild eccentric thickening of the wall. No intimal flap. Otherwise no  evidence of dissection, stenosis (50% or greater) or occlusion. Mild calcific atherosclerosis of the bifurcation.  Vertebral arteries: Codominant. No evidence of dissection, stenosis (50% or greater) or occlusion.  Skeleton: Straightening of cervical lordosis without listhesis. No acute osseous abnormality is identified. Mild cervical spondylosis greatest at the C5-6 level where there are mild endplate degenerative changes.  Other neck: Multinodular thyroid goiter. No lymphadenopathy or discrete cervical mass is identified. Patent aerodigestive tract.  Upper chest: Mild mosaic attenuation probably represents air trapping from expiratory acquisition.  Review of the MIP images confirms the above findings  CTA HEAD FINDINGS  Anterior  circulation: No significant stenosis, proximal occlusion, aneurysm, or vascular malformation. Mild calcific atherosclerosis of the carotid siphons bilaterally.  Posterior circulation: No significant stenosis, proximal occlusion, aneurysm, or vascular malformation. Small caliber vertebrobasilar system due to bilateral fetal PCA circulation.  Venous sinuses: As permitted by contrast timing, patent.  Anatomic variants: Bilateral fetal PCA and small patent anterior communicating artery.  Delayed phase: No abnormal intracranial enhancement.  Review of the MIP images confirms the above findings  IMPRESSION: 1. No acute intracranial abnormality or abnormal enhancement of the brain identified. Chronic lacunar infarct of the right basal ganglia. 2. Short segment of mild narrowing of left common carotid artery with mild eccentric wall thickening possibly corresponding to lesion on carotid duplex. There is no intimal flap to suggest dissection. Focal intimal injury or intramural hematoma is possible but fibrofatty plaque is favored. 3. Otherwise unremarkable angiogram of the head and neck with mild atherosclerotic changes. 4. Multinodular thyroid goiter, further characterization with ultrasound is recommended on a nonemergent basis.   Electronically Signed   By: Mitzi HansenLance  Furusawa-Stratton M.D.   On: 12/05/2015 04:33     Assessment & Plan:   1. Other specified transient cerebral ischemias No residual weakness or paresthesia Continue aspirin  2. Systolic murmur stable  3. Essential hypertension Uncontrolled due to running out of medications Will refill meds, low sodium diet - amLODipine (NORVASC) 5 MG tablet; Take 1 tablet (5 mg total) by mouth daily.  Dispense: 30 tablet; Refill: 5 - hydrALAZINE (APRESOLINE) 50 MG tablet; Take 1 tablet (50 mg total) by mouth 3 (three) times daily.  Dispense: 90 tablet; Refill: 5  Multinodular goiter evident on CT angiogram; will consider  thyroid ultrasound at next visit. Advised that immigration paperwork will be completed at next visit.  Meds ordered this encounter  Medications  . amLODipine (NORVASC) 5 MG tablet    Sig: Take 1 tablet (5 mg total) by mouth daily.    Dispense:  30 tablet    Refill:  5  . hydrALAZINE (APRESOLINE) 50 MG tablet    Sig: Take 1 tablet (50 mg total) by mouth 3 (three) times daily.    Dispense:  90 tablet    Refill:  5    Follow-up: 2 weeks   Jaclyn ShaggyEnobong Amao MD

## 2015-12-27 NOTE — Patient Instructions (Signed)
Hypertension Hypertension, commonly called high blood pressure, is when the force of blood pumping through your arteries is too strong. Your arteries are the blood vessels that carry blood from your heart throughout your body. A blood pressure reading consists of a higher number over a lower number, such as 110/72. The higher number (systolic) is the pressure inside your arteries when your heart pumps. The lower number (diastolic) is the pressure inside your arteries when your heart relaxes. Ideally you want your blood pressure below 120/80. Hypertension forces your heart to work harder to pump blood. Your arteries may become narrow or stiff. Having untreated or uncontrolled hypertension can cause heart attack, stroke, kidney disease, and other problems. RISK FACTORS Some risk factors for high blood pressure are controllable. Others are not.  Risk factors you cannot control include:   Race. You may be at higher risk if you are African American.  Age. Risk increases with age.  Gender. Men are at higher risk than women before age 45 years. After age 65, women are at higher risk than men. Risk factors you can control include:  Not getting enough exercise or physical activity.  Being overweight.  Getting too much fat, sugar, calories, or salt in your diet.  Drinking too much alcohol. SIGNS AND SYMPTOMS Hypertension does not usually cause signs or symptoms. Extremely high blood pressure (hypertensive crisis) may cause headache, anxiety, shortness of breath, and nosebleed. DIAGNOSIS To check if you have hypertension, your health care provider will measure your blood pressure while you are seated, with your arm held at the level of your heart. It should be measured at least twice using the same arm. Certain conditions can cause a difference in blood pressure between your right and left arms. A blood pressure reading that is higher than normal on one occasion does not mean that you need treatment. If  it is not clear whether you have high blood pressure, you may be asked to return on a different day to have your blood pressure checked again. Or, you may be asked to monitor your blood pressure at home for 1 or more weeks. TREATMENT Treating high blood pressure includes making lifestyle changes and possibly taking medicine. Living a healthy lifestyle can help lower high blood pressure. You may need to change some of your habits. Lifestyle changes may include:  Following the DASH diet. This diet is high in fruits, vegetables, and whole grains. It is low in salt, red meat, and added sugars.  Keep your sodium intake below 2,300 mg per day.  Getting at least 30-45 minutes of aerobic exercise at least 4 times per week.  Losing weight if necessary.  Not smoking.  Limiting alcoholic beverages.  Learning ways to reduce stress. Your health care provider may prescribe medicine if lifestyle changes are not enough to get your blood pressure under control, and if one of the following is true:  You are 18-59 years of age and your systolic blood pressure is above 140.  You are 60 years of age or older, and your systolic blood pressure is above 150.  Your diastolic blood pressure is above 90.  You have diabetes, and your systolic blood pressure is over 140 or your diastolic blood pressure is over 90.  You have kidney disease and your blood pressure is above 140/90.  You have heart disease and your blood pressure is above 140/90. Your personal target blood pressure may vary depending on your medical conditions, your age, and other factors. HOME CARE INSTRUCTIONS    Have your blood pressure rechecked as directed by your health care provider.   Take medicines only as directed by your health care provider. Follow the directions carefully. Blood pressure medicines must be taken as prescribed. The medicine does not work as well when you skip doses. Skipping doses also puts you at risk for  problems.  Do not smoke.   Monitor your blood pressure at home as directed by your health care provider. SEEK MEDICAL CARE IF:   You think you are having a reaction to medicines taken.  You have recurrent headaches or feel dizzy.  You have swelling in your ankles.  You have trouble with your vision. SEEK IMMEDIATE MEDICAL CARE IF:  You develop a severe headache or confusion.  You have unusual weakness, numbness, or feel faint.  You have severe chest or abdominal pain.  You vomit repeatedly.  You have trouble breathing. MAKE SURE YOU:   Understand these instructions.  Will watch your condition.  Will get help right away if you are not doing well or get worse.   This information is not intended to replace advice given to you by your health care provider. Make sure you discuss any questions you have with your health care provider.   Document Released: 02/05/2005 Document Revised: 06/22/2014 Document Reviewed: 11/28/2012 Elsevier Interactive Patient Education 2016 Elsevier Inc.  

## 2015-12-27 NOTE — Progress Notes (Signed)
Med refills Has not taken the amolodipine

## 2016-01-11 ENCOUNTER — Encounter: Payer: Self-pay | Admitting: Family Medicine

## 2016-01-11 ENCOUNTER — Ambulatory Visit: Payer: Medicaid Other | Attending: Family Medicine | Admitting: Family Medicine

## 2016-01-11 VITALS — BP 127/72 | HR 72 | Temp 98.4°F | Ht 59.0 in | Wt 129.8 lb

## 2016-01-11 DIAGNOSIS — I10 Essential (primary) hypertension: Secondary | ICD-10-CM | POA: Insufficient documentation

## 2016-01-11 DIAGNOSIS — Z029 Encounter for administrative examinations, unspecified: Secondary | ICD-10-CM | POA: Diagnosis not present

## 2016-01-11 DIAGNOSIS — Z79899 Other long term (current) drug therapy: Secondary | ICD-10-CM | POA: Diagnosis not present

## 2016-01-11 DIAGNOSIS — Z888 Allergy status to other drugs, medicaments and biological substances status: Secondary | ICD-10-CM | POA: Diagnosis not present

## 2016-01-11 DIAGNOSIS — Z9889 Other specified postprocedural states: Secondary | ICD-10-CM | POA: Insufficient documentation

## 2016-01-11 DIAGNOSIS — Z9049 Acquired absence of other specified parts of digestive tract: Secondary | ICD-10-CM | POA: Diagnosis not present

## 2016-01-11 DIAGNOSIS — G8929 Other chronic pain: Secondary | ICD-10-CM | POA: Insufficient documentation

## 2016-01-11 DIAGNOSIS — Z7982 Long term (current) use of aspirin: Secondary | ICD-10-CM | POA: Diagnosis not present

## 2016-01-11 DIAGNOSIS — M25569 Pain in unspecified knee: Secondary | ICD-10-CM | POA: Diagnosis not present

## 2016-01-11 DIAGNOSIS — K219 Gastro-esophageal reflux disease without esophagitis: Secondary | ICD-10-CM | POA: Diagnosis not present

## 2016-01-11 DIAGNOSIS — R413 Other amnesia: Secondary | ICD-10-CM | POA: Diagnosis not present

## 2016-01-11 NOTE — Progress Notes (Signed)
Subjective:  Patient ID: Michelle Boyd, female    DOB: 03/05/1950  Age: 65 y.o. MRN: [id number]  CC: Hypertension and paperwork   HPI [name] presents for Completion of immigration paperwork for examination from citizenship exam due to memory loss. [facility]. Accompanied by her son and worker from immigration office.  Past Medical History:  Diagnosis Date  . Cholelithiasis   . Chronic knee pain   . Gallstone pancreatitis   . GERD (gastroesophageal reflux disease)   . Hypertension   . Memory loss     Past Surgical History:  Procedure Laterality Date  . CATARACT EXTRACTION  2016  . CHOLECYSTECTOMY N/A 10/04/2013   Procedure: LAPAROSCOPIC CHOLECYSTECTOMY WITH INTRAOPERATIVE CHOLANGIOGRAM;  Surgeon: Axel FillerArmando Ramirez, MD;  Location: [facility] OR;  Service: General;  Laterality: N/A;  . EXTERNAL EAR SURGERY  years ago   right mastoidectomy    Allergies  Allergen Reactions  . Feosol [Iron] Itching     Outpatient Medications Prior to Visit  Medication Sig Dispense Refill  . acetaminophen (TYLENOL) 500 MG tablet Take 1 tablet (500 mg total) by mouth every 6 (six) hours as needed. 30 tablet 0  . amLODipine (NORVASC) 5 MG tablet Take 1 tablet (5 mg total) by mouth daily. 30 tablet 5  . aspirin EC 81 MG tablet Take 81 mg by mouth daily as needed for mild pain or moderate pain.    . hydrALAZINE (APRESOLINE) 50 MG tablet Take 1 tablet (50 mg total) by mouth 3 (three) times daily. 90 tablet 5  . lactulose (CHRONULAC) 10 GM/15ML solution Take 15 mLs (10 g total) by mouth 3 (three) times daily. 946 mL 1  . naproxen (NAPROSYN) 500 MG tablet Take 1 tablet (500 mg total) by mouth 2 (two) times daily with a meal. 30 tablet 0  . propranolol (INDERAL) 20 MG tablet Take 1 tablet (20 mg total) by mouth 2 (two) times daily. 60 tablet 0  . ibuprofen (ADVIL,MOTRIN) 600 MG tablet Take 1 tablet (600 mg total) by mouth every 6 (six) hours as needed. (Patient not  taking: Reported on 01/11/2016) 30 tablet 0   No facility-administered medications prior to visit.     ROS Review of Systems  Constitutional: Negative for activity change and appetite change.  HENT: Negative for sinus pressure and sore throat.   Respiratory: Negative for chest tightness, shortness of breath and wheezing.   Cardiovascular: Negative for chest pain and palpitations.  Gastrointestinal: Negative for abdominal distention, abdominal pain and constipation.  Genitourinary: Negative.   Musculoskeletal: Negative.   Psychiatric/Behavioral: Negative for behavioral problems and dysphoric mood.    Objective:  BP 127/72 (BP Location: Right Arm, Patient Position: Sitting, Cuff Size: Small)   Pulse 72   Temp 98.4 F (36.9 C) (Oral)   Ht 4\' 11"  (1.499 m)   Wt 129 lb 12.8 oz (58.9 kg)   SpO2 98%   BMI 26.22 kg/m   BP/Weight 01/11/2016 12/27/2015 12/05/2015  Systolic BP 127 157 128  Diastolic BP 72 69 70  Wt. (Lbs) 129.8 124.2 -  BMI 26.22 25.52 -      Physical Exam  Constitutional: She is oriented to person, place, and time. She appears well-developed and well-nourished.  Cardiovascular: Normal rate, normal heart sounds and intact distal pulses.   No murmur heard. Pulmonary/Chest: Effort normal and breath sounds normal. She has no wheezes. She has no rales. She exhibits no tenderness.  Abdominal: Soft. Bowel sounds are normal. She exhibits no  distension and no mass. There is no tenderness.  Musculoskeletal: Normal range of motion.  Neurological: She is alert and oriented to person, place, and time.     Assessment & Plan:   1. Memory loss   2. Encounters for administrative purpose Completed paperwork   No orders of the defined types were placed in this encounter.   Follow-up: 3 months follow-up on hypertension  Jaclyn ShaggyEnobong Amao MD

## 2016-01-11 NOTE — Progress Notes (Signed)
Citizenship paperwork- disability exception

## 2016-06-27 ENCOUNTER — Other Ambulatory Visit: Payer: Self-pay | Admitting: Family Medicine

## 2016-06-27 ENCOUNTER — Telehealth: Payer: Self-pay | Admitting: Family Medicine

## 2016-06-27 DIAGNOSIS — I10 Essential (primary) hypertension: Secondary | ICD-10-CM

## 2016-06-27 MED ORDER — HYDRALAZINE HCL 50 MG PO TABS: 50.0000 mg | ORAL_TABLET | Freq: Three times a day (TID) | ORAL | 0 refills | Status: DC

## 2016-06-27 MED ORDER — AMLODIPINE BESYLATE 5 MG PO TABS: 5.0000 mg | ORAL_TABLET | Freq: Every day | ORAL | 0 refills | Status: DC

## 2016-06-27 NOTE — Telephone Encounter (Signed)
Pt's son come in to inform that mother needs refill of  Hydralazine and amlodipine. Please f/u .

## 2016-06-27 NOTE — Telephone Encounter (Signed)
Refilled

## 2016-07-15 ENCOUNTER — Emergency Department (HOSPITAL_COMMUNITY)
Admission: EM | Admit: 2016-07-15 | Discharge: 2016-07-15 | Disposition: A | Discharge status: Home / Self Care | Payer: Medicaid Other | Attending: Emergency Medicine | Admitting: Emergency Medicine

## 2016-07-15 ENCOUNTER — Emergency Department (HOSPITAL_COMMUNITY): Payer: Medicaid Other

## 2016-07-15 ENCOUNTER — Emergency Department (HOSPITAL_BASED_OUTPATIENT_CLINIC_OR_DEPARTMENT_OTHER)
Admit: 2016-07-15 | Discharge: 2016-07-15 | Disposition: A | Discharge status: Home / Self Care | Payer: Medicaid Other | Attending: Student | Admitting: Student

## 2016-07-15 ENCOUNTER — Encounter (HOSPITAL_COMMUNITY): Payer: Self-pay

## 2016-07-15 DIAGNOSIS — Z7982 Long term (current) use of aspirin: Secondary | ICD-10-CM | POA: Diagnosis not present

## 2016-07-15 DIAGNOSIS — Z79899 Other long term (current) drug therapy: Secondary | ICD-10-CM | POA: Diagnosis not present

## 2016-07-15 DIAGNOSIS — M79609 Pain in unspecified limb: Secondary | ICD-10-CM | POA: Diagnosis not present

## 2016-07-15 DIAGNOSIS — I10 Essential (primary) hypertension: Secondary | ICD-10-CM | POA: Diagnosis not present

## 2016-07-15 DIAGNOSIS — G8929 Other chronic pain: Secondary | ICD-10-CM | POA: Insufficient documentation

## 2016-07-15 DIAGNOSIS — M25561 Pain in right knee: Secondary | ICD-10-CM | POA: Diagnosis present

## 2016-07-15 MED ORDER — NAPROXEN 250 MG PO TABS
375.0000 mg | ORAL_TABLET | Freq: Once | ORAL | Status: AC
  Administered 2016-07-15: 375 mg via ORAL
  Filled 2016-07-15: qty 2

## 2016-07-15 NOTE — Progress Notes (Signed)
Orthopedic Tech Progress Note Patient Details:  Michelle Boyd 08/04/1950 [id number]  Ortho Devices Type of Ortho Device: Knee Sleeve Ortho Device/Splint Location: RLE Ortho Device/Splint Interventions: Ordered, Application   Jennye MoccasinHughes, Olympia Adelsberger Craig 07/15/2016, 4:59 PM

## 2016-07-15 NOTE — ED Triage Notes (Signed)
Pt presents to the ed with complaints of pain in her right leg that starts in her hip and radiates down to her foot, denies any injury. States this happens every year and she has to get an injection in her knee. Cms intact bilaterally.

## 2016-07-15 NOTE — ED Notes (Signed)
Ortho tech coming to apply knee sleeve

## 2016-07-15 NOTE — Discharge Instructions (Signed)
Her x-ray shows arthritic changes. Your ultrasound shows no blood clot. You may need an injection in her right knee. [facility]. Use the knee sleeve for comfort. Rest and ice and elevate her right knee. Tylenol and Motrin for pain.

## 2016-07-15 NOTE — ED Provider Notes (Signed)
[facility]-EMERGENCY DEPT Provider Note   CSN: [id number] Arrival date & time: 07/15/16  1315  By signing my name below, I, Cynda Acres, attest that this documentation has been prepared under the direction and in the presence of Demetrios Loll, PA-C. Electronically Signed: Cynda Acres, Scribe. 07/15/16. 2:59 PM.  History   Chief Complaint Chief Complaint  Patient presents with  . Leg Pain   HPI Comments: Michelle Boyd is a 66 y.o. female with a history of chronic right knee pain, who presents to the Emergency Department complaining of sudden-onset, constant right knee pain that began one week ago. Pt is [place] speaking but daughter interprets at bedside. According to daughter bedside this pain flares up every year, in which she usually receives a steroid injection directly on the knee. Saw sports medicine last year with sig improvement in symptom until recently. Patient has been seen by an orthopedist in the past. Patient reports associated radiation down the to the foot. Patient reports applying hot compresses and taking ibuprofen. Patient is ambulatory with pain in the emergency department. Patient denies any fever, chills, numbness, weakness, back pain, hip pain, lower extremity edema or erythema or any additional symptoms.   The history is provided by the patient. No language interpreter was used.    Past Medical History:  Diagnosis Date  . Cholelithiasis   . Chronic knee pain   . Gallstone pancreatitis   . GERD (gastroesophageal reflux disease)   . Hypertension   . Memory loss     Patient Active Problem List   Diagnosis Date Noted  . Memory loss 01/11/2016  . Dissection of carotid artery (HCC)   . Right sided weakness   . Hemiplegic migraine without status migrainosus, not intractable   . Right facial numbness   . Right arm numbness   . Right leg numbness   . Systolic murmur   . Dizziness   . Transient cerebral ischemia 12/03/2015  . Bilateral chronic knee pain 11/11/2014    . AKI (acute kidney injury) (HCC) 10/22/2013  . Sepsis secondary to UTI (HCC) 10/22/2013  . Acute renal failure (HCC) 10/22/2013  . Pyelonephritis, acute 10/22/2013  . Iron (Fe) deficiency anemia 10/22/2013  . Elevated LFTs 10/22/2013  . Microcytic anemia 10/05/2013  . Cholecystitis, acute 10/04/2013  . Other pancytopenia (HCC) 10/03/2013  . Gallstone pancreatitis 10/02/2013  . Gallstones 09/29/2013  . Non-traumatic compression fracture of L3 & L4 lumbar vertebra 08/24/2013  . Right knee pain 11/07/2012  . Hypertension 05/26/2012  . Hypokalemia 05/26/2012    Past Surgical History:  Procedure Laterality Date  . CATARACT EXTRACTION  2016  . CHOLECYSTECTOMY N/A 10/04/2013   Procedure: LAPAROSCOPIC CHOLECYSTECTOMY WITH INTRAOPERATIVE CHOLANGIOGRAM;  Surgeon: Axel Filler, MD;  Location: [facility] OR;  Service: General;  Laterality: N/A;  . EXTERNAL EAR SURGERY  years ago   right mastoidectomy    OB History    No data available       Home Medications    Prior to Admission medications   Medication Sig Start Date End Date Taking? Authorizing Provider  acetaminophen (TYLENOL) 500 MG tablet Take 1 tablet (500 mg total) by mouth every 6 (six) hours as needed. 10/05/15   Dan Humphreys, MD  amLODipine (NORVASC) 5 MG tablet Take 1 tablet (5 mg total) by mouth daily. 06/27/16   Jaclyn Shaggy, MD  aspirin EC 81 MG tablet Take 81 mg by mouth daily as needed for mild pain or moderate pain.    [provider]  hydrALAZINE (APRESOLINE) 50  MG tablet Take 1 tablet (50 mg total) by mouth 3 (three) times daily. 06/27/16   Jaclyn ShaggyAmao, Enobong, MD  ibuprofen (ADVIL,MOTRIN) 600 MG tablet Take 1 tablet (600 mg total) by mouth every 6 (six) hours as needed. Patient not taking: Reported on 01/11/2016 10/05/15   Dan HumphreysIrick, Michael, MD  lactulose (CHRONULAC) 10 GM/15ML solution Take 15 mLs (10 g total) by mouth 3 (three) times daily. 10/18/15   Jaclyn ShaggyAmao, Enobong, MD  naproxen (NAPROSYN) 500 MG tablet Take 1 tablet  (500 mg total) by mouth 2 (two) times daily with a meal. 10/03/15   Jaclyn ShaggyAmao, Enobong, MD  propranolol (INDERAL) 20 MG tablet Take 1 tablet (20 mg total) by mouth 2 (two) times daily. 12/05/15   Freddrick MarchAmin, Yashika, MD    Family History Family History  Problem Relation Age of Onset  . Diabetes Father     Social History Social History  Substance Use Topics  . Smoking status: Never Smoker  . Smokeless tobacco: Never Used  . Alcohol use No     Allergies   Feosol [iron]   Review of Systems Review of Systems  Constitutional: Negative for chills and fever.  Gastrointestinal: Negative for nausea and vomiting.  Musculoskeletal: Positive for arthralgias (right knee). Negative for gait problem and joint swelling.  Neurological: Negative for weakness and numbness.     Physical Exam Updated Vital Signs BP 140/64 (BP Location: Left Arm)   Pulse 83   Temp 98.1 F (36.7 C) (Oral)   Resp 16   Ht 5' (1.524 m)   Wt 140 lb (63.5 kg)   SpO2 99%   BMI 27.34 kg/m   Physical Exam  Constitutional: She is oriented to person, place, and time. She appears well-developed.  HENT:  Head: Normocephalic and atraumatic.  Mouth/Throat: Oropharynx is clear and moist.  Eyes: Conjunctivae and EOM are normal. Pupils are equal, round, and reactive to light.  Neck: Normal range of motion. Neck supple.  Cardiovascular: Normal rate.   Pulmonary/Chest: Effort normal.  Abdominal: Soft. Bowel sounds are normal.  Musculoskeletal: Normal range of motion. She exhibits tenderness. She exhibits no edema or deformity.       Right knee: She exhibits bony tenderness. She exhibits normal range of motion, no swelling, no effusion, no ecchymosis, no deformity, no laceration, no erythema, normal alignment, no LCL laxity and normal patellar mobility. Tenderness found. Medial joint line and lateral joint line tenderness noted. No MCL tenderness noted.  No midline T-spine or L-spine tenderness. No deformity step-off noted. Full  range of motion. No paraspinal tenderness.  Neurological: She is alert and oriented to person, place, and time.  Skin: Skin is warm and dry.     ED Treatments / Results  DIAGNOSTIC STUDIES: Oxygen Saturation is 99% on RA, normal by my interpretation.    COORDINATION OF CARE: 2:59 PM Discussed treatment plan with pt at bedside and pt agreed to plan, which includes imaging and Toradol.   Labs (all labs ordered are listed, but only abnormal results are displayed) Labs Reviewed - No data to display  EKG  EKG Interpretation None       Radiology Dg Knee Complete 4 Views Right  Result Date: 07/15/2016 CLINICAL DATA:  Right knee pain for 1 week, no known injury, initial encounter EXAM: RIGHT KNEE - COMPLETE 4+ VIEW COMPARISON:  10/16/2014 FINDINGS: Tricompartmental degenerative changes are noted. No acute fracture or dislocation is seen. No soft tissue abnormality is noted. IMPRESSION: Degenerative change without acute abnormality. Electronically Signed   By:  Alcide Clever M.D.   On: 07/15/2016 15:57    Procedures Procedures (including critical care time)  Medications Ordered in ED Medications  naproxen (NAPROSYN) tablet 375 mg (375 mg Oral Given 07/15/16 1642)     Initial Impression / Assessment and Plan / ED Course  I have reviewed the triage vital signs and the nursing notes.  Pertinent labs & imaging results that were available during my care of the patient were reviewed by me and considered in my medical decision making (see chart for details).     Patient presents to the ED with acute on chronic right knee pain. Has had injections in the past that seemed to work. Patient is neurovascularly intact. Able to ambulate with normal gait. No erythema or edema concerning for septic joint. X-ray shows degenerative changes without acute abnormalities. Patient does complain of calf pain so ultrasound of lower extremity was ordered. No DVT was seen. This is likely a chronic arthritic  pain that needs follow-up with orthopedist. Patient given knee sleeve and encourage rice therapy at home with Motrin and Tylenol. Pt is hemodynamically stable, in NAD, & able to ambulate in the ED.   Pain has been managed & has no complaints prior to dc. Pt is comfortable with above plan and is stable for discharge at this time. All questions were answered prior to disposition. Strict return precautions for f/u to the ED were discussed.   Final Clinical Impressions(s) / ED Diagnoses   Final diagnoses:  Chronic pain of right knee    New Prescriptions New Prescriptions   No medications on file   I personally performed the services described in this documentation, which was scribed in my presence. The recorded information has been reviewed and is accurate.     Rise Mu, PA-C 07/15/16 1658    Tilden Fossa, MD 07/15/16 1719

## 2016-07-15 NOTE — Progress Notes (Signed)
*  PRELIMINARY RESULTS* Vascular Ultrasound Right lower extremity venous duplex has been completed.  Preliminary findings: No evidence of DVT or baker's cyst.    Farrel DemarkJill Eunice, RDMS, RVT  07/15/2016, 3:22 PM

## 2016-07-15 NOTE — ED Notes (Signed)
Pt returned from xray

## 2016-08-23 ENCOUNTER — Other Ambulatory Visit: Payer: Self-pay | Admitting: Family Medicine

## 2016-08-23 DIAGNOSIS — I10 Essential (primary) hypertension: Secondary | ICD-10-CM

## 2016-08-24 ENCOUNTER — Other Ambulatory Visit: Payer: Self-pay | Admitting: Family Medicine

## 2016-08-24 ENCOUNTER — Telehealth: Payer: Self-pay | Admitting: Family Medicine

## 2016-08-24 DIAGNOSIS — I10 Essential (primary) hypertension: Secondary | ICD-10-CM

## 2016-08-24 MED ORDER — AMLODIPINE BESYLATE 5 MG PO TABS: 5.0000 mg | ORAL_TABLET | Freq: Every day | ORAL | 0 refills | Status: DC

## 2016-08-24 MED ORDER — PROPRANOLOL HCL 20 MG PO TABS: 20.0000 mg | ORAL_TABLET | Freq: Two times a day (BID) | ORAL | 0 refills | Status: DC

## 2016-08-24 MED ORDER — HYDRALAZINE HCL 50 MG PO TABS: 50.0000 mg | ORAL_TABLET | Freq: Three times a day (TID) | ORAL | 0 refills | Status: DC

## 2016-08-24 NOTE — Telephone Encounter (Signed)
Refilled

## 2016-08-24 NOTE — Telephone Encounter (Signed)
Pt. Called requesting a refill on her BP medication. Pt. Has already scheduled an appt. For 09/21/16. [facility] [address]. Please f/u

## 2016-09-21 ENCOUNTER — Other Ambulatory Visit: Payer: Self-pay | Admitting: Family Medicine

## 2016-09-21 ENCOUNTER — Encounter: Payer: Self-pay | Admitting: Family Medicine

## 2016-09-21 ENCOUNTER — Ambulatory Visit: Payer: Medicaid Other | Attending: Family Medicine | Admitting: Family Medicine

## 2016-09-21 VITALS — BP 150/70 | HR 61 | Temp 98.3°F | Resp 16 | Wt 131.4 lb

## 2016-09-21 DIAGNOSIS — I10 Essential (primary) hypertension: Secondary | ICD-10-CM | POA: Diagnosis present

## 2016-09-21 DIAGNOSIS — Z79899 Other long term (current) drug therapy: Secondary | ICD-10-CM | POA: Diagnosis not present

## 2016-09-21 DIAGNOSIS — R42 Dizziness and giddiness: Secondary | ICD-10-CM | POA: Diagnosis not present

## 2016-09-21 DIAGNOSIS — K219 Gastro-esophageal reflux disease without esophagitis: Secondary | ICD-10-CM | POA: Insufficient documentation

## 2016-09-21 DIAGNOSIS — Z8673 Personal history of transient ischemic attack (TIA), and cerebral infarction without residual deficits: Secondary | ICD-10-CM | POA: Diagnosis not present

## 2016-09-21 DIAGNOSIS — M1711 Unilateral primary osteoarthritis, right knee: Secondary | ICD-10-CM | POA: Diagnosis not present

## 2016-09-21 DIAGNOSIS — Z9889 Other specified postprocedural states: Secondary | ICD-10-CM | POA: Insufficient documentation

## 2016-09-21 DIAGNOSIS — M1990 Unspecified osteoarthritis, unspecified site: Secondary | ICD-10-CM | POA: Insufficient documentation

## 2016-09-21 DIAGNOSIS — Z7982 Long term (current) use of aspirin: Secondary | ICD-10-CM | POA: Insufficient documentation

## 2016-09-21 DIAGNOSIS — Z131 Encounter for screening for diabetes mellitus: Secondary | ICD-10-CM | POA: Diagnosis not present

## 2016-09-21 DIAGNOSIS — Z9049 Acquired absence of other specified parts of digestive tract: Secondary | ICD-10-CM | POA: Diagnosis not present

## 2016-09-21 DIAGNOSIS — H547 Unspecified visual loss: Secondary | ICD-10-CM | POA: Diagnosis not present

## 2016-09-21 DIAGNOSIS — G458 Other transient cerebral ischemic attacks and related syndromes: Secondary | ICD-10-CM

## 2016-09-21 LAB — POCT GLYCOSYLATED HEMOGLOBIN (HGB A1C): HEMOGLOBIN A1C: 5.4

## 2016-09-21 MED ORDER — MECLIZINE HCL 25 MG PO TABS: 25.0000 mg | ORAL_TABLET | Freq: Two times a day (BID) | ORAL | 2 refills | Status: DC | PRN

## 2016-09-21 MED ORDER — AMLODIPINE BESYLATE 10 MG PO TABS: 10.0000 mg | ORAL_TABLET | Freq: Every day | ORAL | 5 refills | Status: DC

## 2016-09-21 MED ORDER — PROPRANOLOL HCL 20 MG PO TABS: 20.0000 mg | ORAL_TABLET | Freq: Two times a day (BID) | ORAL | 5 refills | Status: DC

## 2016-09-21 MED ORDER — NAPROXEN 500 MG PO TABS: 500.0000 mg | ORAL_TABLET | Freq: Two times a day (BID) | ORAL | 2 refills | Status: DC

## 2016-09-21 MED ORDER — HYDRALAZINE HCL 50 MG PO TABS: 50.0000 mg | ORAL_TABLET | Freq: Three times a day (TID) | ORAL | 5 refills | Status: DC

## 2016-09-21 NOTE — Progress Notes (Signed)
Subjective:  Patient ID: Michelle Boyd, female    DOB: 09/02/1950  Age: 66 y.o. MRN: [id number]  CC: Follow-up visit  HPI Michelle Boyd is a 66 year old female with a history of Hypertension, GERD, TIA who presents today for follow-up visit and is accompanied by her daughter. She is seen with the aid and Nepali Video interpreter.  She complains of worsening right knee pain. Received corticosteroid injections in the past from Sports medicine and currently does not have any medications for her knee pain.  She also complains of dizziness worse with turning her head or changing position. Denies hearing loss, earaches, sinus symptoms. Denies nausea or vomiting. Of note she has had reduced vision from her right eye the last month and would like to see an ophthalmologist. Denied trauma to the eye.  Denies shortness of breath, chest pains, weakness in any extremity.  Past Medical History:  Diagnosis Date  . Cholelithiasis   . Chronic knee pain   . Gallstone pancreatitis   . GERD (gastroesophageal reflux disease)   . Hypertension   . Memory loss     Past Surgical History:  Procedure Laterality Date  . CATARACT EXTRACTION  2016  . CHOLECYSTECTOMY N/A 10/04/2013   Procedure: LAPAROSCOPIC CHOLECYSTECTOMY WITH INTRAOPERATIVE CHOLANGIOGRAM;  Surgeon: Axel FillerArmando Ramirez, MD;  Location: [facility] OR;  Service: General;  Laterality: N/A;  . EXTERNAL EAR SURGERY  years ago   right mastoidectomy    Allergies  Allergen Reactions  . Feosol [Iron] Itching     Outpatient Medications Prior to Visit  Medication Sig Dispense Refill  . amLODipine (NORVASC) 5 MG tablet Take 1 tablet (5 mg total) by mouth daily. 30 tablet 0  . hydrALAZINE (APRESOLINE) 50 MG tablet Take 1 tablet (50 mg total) by mouth 3 (three) times daily. 90 tablet 0  . propranolol (INDERAL) 20 MG tablet Take 1 tablet (20 mg total) by mouth 2 (two) times daily. 60 tablet 0  . aspirin EC 81 MG tablet Take 81 mg by mouth daily as needed for mild pain  or moderate pain.    Marland Kitchen. lactulose (CHRONULAC) 10 GM/15ML solution Take 15 mLs (10 g total) by mouth 3 (three) times daily. (Patient not taking: Reported on 09/21/2016) 946 mL 1  . acetaminophen (TYLENOL) 500 MG tablet Take 1 tablet (500 mg total) by mouth every 6 (six) hours as needed. (Patient not taking: Reported on 09/21/2016) 30 tablet 0  . ibuprofen (ADVIL,MOTRIN) 600 MG tablet Take 1 tablet (600 mg total) by mouth every 6 (six) hours as needed. (Patient not taking: Reported on 01/11/2016) 30 tablet 0  . naproxen (NAPROSYN) 500 MG tablet Take 1 tablet (500 mg total) by mouth 2 (two) times daily with a meal. (Patient not taking: Reported on 09/21/2016) 30 tablet 0   No facility-administered medications prior to visit.     ROS Review of Systems  Constitutional: Negative for activity change, appetite change and fatigue.  HENT: Negative for congestion, sinus pressure and sore throat.   Eyes: Positive for visual disturbance.  Respiratory: Negative for cough, chest tightness, shortness of breath and wheezing.   Cardiovascular: Negative for chest pain and palpitations.  Gastrointestinal: Negative for abdominal distention, abdominal pain and constipation.  Endocrine: Negative for polydipsia.  Genitourinary: Negative for dysuria and frequency.  Musculoskeletal: Negative for arthralgias and back pain.       See history of present illness  Skin: Negative for rash.  Neurological: Positive for light-headedness. Negative for tremors and numbness.  Hematological: Does  not bruise/bleed easily.  Psychiatric/Behavioral: Negative for agitation and behavioral problems.    Objective:  BP (!) 150/70 (BP Location: Right Arm, Patient Position: Sitting, Cuff Size: Large)   Pulse 61   Temp 98.3 F (36.8 C) (Oral)   Resp 16   Wt 131 lb 6.4 oz (59.6 kg)   SpO2 97%   BMI 25.66 kg/m   BP/Weight 09/21/2016 07/15/2016 01/11/2016  Systolic BP 150 143 127  Diastolic BP 70 72 72  Wt. (Lbs) 131.4 140 129.8  BMI  25.66 27.34 26.22      Physical Exam  Constitutional: She is oriented to person, place, and time. She appears well-developed and well-nourished.  Cardiovascular: Normal rate, normal heart sounds and intact distal pulses.   No murmur heard. Pulmonary/Chest: Effort normal and breath sounds normal. She has no wheezes. She has no rales. She exhibits no tenderness.  Abdominal: Soft. Bowel sounds are normal. She exhibits no distension and no mass. There is no tenderness.  Musculoskeletal: She exhibits edema (Slight edema of right knee) and tenderness (Tenderness and crepitus on range of motion of the right knee).  Neurological: She is alert and oriented to person, place, and time.    Lab Results  Component Value Date   HGBA1C 5.4 09/21/2016    Assessment & Plan:   1. Screening for diabetes mellitus A1c 5.4 - HgB A1c  2. Essential hypertension Uncontrolled Increased dose of amlodipine - amLODipine (NORVASC) 10 MG tablet; Take 1 tablet (10 mg total) by mouth daily.  Dispense: 30 tablet; Refill: 5 - hydrALAZINE (APRESOLINE) 50 MG tablet; Take 1 tablet (50 mg total) by mouth 3 (three) times daily.  Dispense: 90 tablet; Refill: 5 - propranolol (INDERAL) 20 MG tablet; Take 1 tablet (20 mg total) by mouth 2 (two) times daily.  Dispense: 60 tablet; Refill: 5  3. Other specified transient cerebral ischemias Continue aspirin  4. Primary osteoarthritis of right knee - naproxen (NAPROSYN) 500 MG tablet; Take 1 tablet (500 mg total) by mouth 2 (two) times daily with a meal.  Dispense: 30 tablet; Refill: 2  5. Visual impairment A1c 5.4 - Ambulatory referral to Ophthalmology  6. Vertigo - meclizine (ANTIVERT) 25 MG tablet; Take 1 tablet (25 mg total) by mouth 2 (two) times daily as needed.  Dispense: 60 tablet; Refill: 2   Meds ordered this encounter  Medications  . amLODipine (NORVASC) 10 MG tablet    Sig: Take 1 tablet (10 mg total) by mouth daily.    Dispense:  30 tablet    Refill:   5  . naproxen (NAPROSYN) 500 MG tablet    Sig: Take 1 tablet (500 mg total) by mouth 2 (two) times daily with a meal.    Dispense:  30 tablet    Refill:  2  . hydrALAZINE (APRESOLINE) 50 MG tablet    Sig: Take 1 tablet (50 mg total) by mouth 3 (three) times daily.    Dispense:  90 tablet    Refill:  5  . propranolol (INDERAL) 20 MG tablet    Sig: Take 1 tablet (20 mg total) by mouth 2 (two) times daily.    Dispense:  60 tablet    Refill:  5  . meclizine (ANTIVERT) 25 MG tablet    Sig: Take 1 tablet (25 mg total) by mouth 2 (two) times daily as needed.    Dispense:  60 tablet    Refill:  2    Follow-up: Return in about 3 months (around 12/22/2016) for Follow-up  of chronic medical conditions.   Arnoldo Morale MD

## 2016-09-21 NOTE — Progress Notes (Signed)
F/u HTN Med RF Pt feels dizzy at times Family request test for DM Vision concerns: unable to see out of right eye for 1 month

## 2016-09-24 ENCOUNTER — Ambulatory Visit: Payer: Medicaid Other | Attending: Family Medicine

## 2016-09-24 DIAGNOSIS — I10 Essential (primary) hypertension: Secondary | ICD-10-CM

## 2016-09-25 LAB — CMP14+EGFR
A/G RATIO: 1.6 (ref 1.2–2.2)
ALT: 18 IU/L (ref 0–32)
AST: 24 IU/L (ref 0–40)
Albumin: 4.4 g/dL (ref 3.6–4.8)
Alkaline Phosphatase: 127 IU/L — ABNORMAL HIGH (ref 39–117)
BILIRUBIN TOTAL: 1.4 mg/dL — AB (ref 0.0–1.2)
BUN/Creatinine Ratio: 12 (ref 12–28)
BUN: 10 mg/dL (ref 8–27)
CALCIUM: 9.4 mg/dL (ref 8.7–10.3)
CHLORIDE: 105 mmol/L (ref 96–106)
CO2: 23 mmol/L (ref 20–29)
Creatinine, Ser: 0.86 mg/dL (ref 0.57–1.00)
GFR, EST AFRICAN AMERICAN: 80 mL/min/{1.73_m2} (ref >59)
GFR, EST NON AFRICAN AMERICAN: 70 mL/min/{1.73_m2} (ref >59)
GLOBULIN, TOTAL: 2.7 g/dL (ref 1.5–4.5)
Glucose: 88 mg/dL (ref 65–99)
POTASSIUM: 4.1 mmol/L (ref 3.5–5.2)
SODIUM: 142 mmol/L (ref 134–144)
TOTAL PROTEIN: 7.1 g/dL (ref 6.0–8.5)

## 2016-09-25 LAB — LIPID PANEL
CHOL/HDL RATIO: 3.5 ratio (ref 0.0–4.4)
Cholesterol, Total: 190 mg/dL (ref 100–199)
HDL: 54 mg/dL (ref >39)
LDL Calculated: 117 mg/dL — ABNORMAL HIGH (ref 0–99)
Triglycerides: 94 mg/dL (ref 0–149)
VLDL Cholesterol Cal: 19 mg/dL (ref 5–40)

## 2016-10-04 ENCOUNTER — Telehealth: Payer: Self-pay

## 2016-10-04 NOTE — Telephone Encounter (Signed)
Pt was called and informed of lab results. 

## 2016-10-05 ENCOUNTER — Telehealth: Payer: Self-pay | Admitting: Family Medicine

## 2016-10-05 NOTE — Telephone Encounter (Signed)
Pt came to the office to drop up paperwork for DDS, papers will be at the pcp inbox, please call pt to pick up papers when they are done

## 2016-10-08 NOTE — Telephone Encounter (Signed)
Will be on the lookout for paperwork and would communicate if an office visit is needed or not.

## 2016-10-11 ENCOUNTER — Telehealth: Payer: Self-pay

## 2016-10-11 NOTE — Telephone Encounter (Signed)
Call received from [facility] with CAP program.  She stated that the they will send the Physician Attestation Form to the provider after the application is received.  She explained that the family/caregiver usually submits the application and 2 signed documents and after CAP reviews that information, the attestation will be sent for signature.  She noted that the ICD 10 codes should be included with the diagnoses.   She also said that since the clinic has the application, the completed document can be faxed to her at fax # [phone] or [phone].

## 2016-10-11 NOTE — Telephone Encounter (Signed)
Attempted to contact Marcello Moores, CAP program # [phone] to notify her that a Physician Attestation Form is not included with the application for CAP services that is currently being reviewed. Inquired if the form that is available on-line is sufficient. Message left requesting a call back to # [phone]

## 2016-10-15 ENCOUNTER — Telehealth: Payer: Self-pay

## 2016-10-15 NOTE — Telephone Encounter (Signed)
Attempted to contact the patient's son, [name], to inform him that this CM can fax the application for personal care services to CAP if he is in agreement. He does not have to pick it up and mail it.   With the assistance of Nepali interpreter # [phone] with [facility], call placed to # [phone] and a voicemail message was left requesting a call back to # [phone]. Call also placed to # [phone] and a message was left with [name] ( [name]'s wife) requesting that [name] return the call to this CM at # [phone].

## 2016-10-16 ENCOUNTER — Telehealth: Payer: Self-pay | Admitting: Family Medicine

## 2016-10-16 NOTE — Telephone Encounter (Signed)
Patient's son returned CM's message regarding CAP documents. [name] stated that it was ok to fax the CAP documents.

## 2016-10-16 NOTE — Telephone Encounter (Signed)
After receiving approval from patient's son, [name], [place] application faxed to [facility] - fax # [phone]

## 2016-10-17 ENCOUNTER — Telehealth: Payer: Self-pay | Admitting: Family Medicine

## 2016-10-17 NOTE — Telephone Encounter (Signed)
[facility] # [phone], regarding the CAP documents that was faxed on 10/16/16. Jasmine stated that she did receive the documents and will begin the process. I provided Jasmine with patient's son Glendale ChardKehdar phone number (# [phone] as well as patient's Medicaid number.

## 2016-11-20 ENCOUNTER — Emergency Department (HOSPITAL_COMMUNITY): Payer: Medicaid Other

## 2016-11-20 ENCOUNTER — Encounter (HOSPITAL_COMMUNITY): Payer: Self-pay

## 2016-11-20 ENCOUNTER — Emergency Department (HOSPITAL_BASED_OUTPATIENT_CLINIC_OR_DEPARTMENT_OTHER): Admit: 2016-11-20 | Discharge: 2016-11-20 | Disposition: A | Discharge status: Home / Self Care | Payer: Medicaid Other

## 2016-11-20 ENCOUNTER — Emergency Department (HOSPITAL_COMMUNITY)
Admission: EM | Admit: 2016-11-20 | Discharge: 2016-11-20 | Disposition: A | Discharge status: Home / Self Care | Payer: Medicaid Other | Attending: Emergency Medicine | Admitting: Emergency Medicine

## 2016-11-20 DIAGNOSIS — M79609 Pain in unspecified limb: Secondary | ICD-10-CM

## 2016-11-20 DIAGNOSIS — R600 Localized edema: Secondary | ICD-10-CM | POA: Insufficient documentation

## 2016-11-20 DIAGNOSIS — R609 Edema, unspecified: Secondary | ICD-10-CM

## 2016-11-20 DIAGNOSIS — M7989 Other specified soft tissue disorders: Secondary | ICD-10-CM | POA: Diagnosis not present

## 2016-11-20 DIAGNOSIS — R2243 Localized swelling, mass and lump, lower limb, bilateral: Secondary | ICD-10-CM | POA: Diagnosis present

## 2016-11-20 DIAGNOSIS — Z79899 Other long term (current) drug therapy: Secondary | ICD-10-CM | POA: Diagnosis not present

## 2016-11-20 DIAGNOSIS — I10 Essential (primary) hypertension: Secondary | ICD-10-CM | POA: Insufficient documentation

## 2016-11-20 MED ORDER — ACETAMINOPHEN 500 MG PO TABS
1000.0000 mg | ORAL_TABLET | Freq: Once | ORAL | Status: AC
  Administered 2016-11-20: 1000 mg via ORAL
  Filled 2016-11-20: qty 2

## 2016-11-20 NOTE — ED Provider Notes (Signed)
[facility]-EMERGENCY DEPT Provider Note   CSN: [id number] Arrival date & time: 11/20/16  4098     History   Chief Complaint Chief Complaint  Patient presents with  . Leg Swelling  . URI    HPI Michelle Boyd is a 66 y.o. female with h/o HTN presents to ED for evaluation of right leg swelling, pain, burning x 1 week. Symptoms are constant. Pain radiates to posterior calf. Exacerbated by touch, weight baring and AROM. Swelling is worse at end of day and after standing for long time. Has not tried anything for pain, no alleviating factors. No previous h/o DVT/PE. No h/o malignancy, prolonged travel, birth control pills. No h/o gout, DM or IVDU. No trauma. Denies fevers, chills, redness, warmth, numbness to RLE.    HPI  Past Medical History:  Diagnosis Date  . Cholelithiasis   . Chronic knee pain   . Gallstone pancreatitis   . GERD (gastroesophageal reflux disease)   . Hypertension   . Memory loss     Patient Active Problem List   Diagnosis Date Noted  . Osteoarthritis 09/21/2016  . Memory loss 01/11/2016  . Dissection of carotid artery (HCC)   . Right sided weakness   . Hemiplegic migraine without status migrainosus, not intractable   . Right facial numbness   . Right arm numbness   . Right leg numbness   . Systolic murmur   . Dizziness   . Transient cerebral ischemia 12/03/2015  . Bilateral chronic knee pain 11/11/2014  . AKI (acute kidney injury) (HCC) 10/22/2013  . Sepsis secondary to UTI (HCC) 10/22/2013  . Acute renal failure (HCC) 10/22/2013  . Pyelonephritis, acute 10/22/2013  . Iron (Fe) deficiency anemia 10/22/2013  . Elevated LFTs 10/22/2013  . Microcytic anemia 10/05/2013  . Cholecystitis, acute 10/04/2013  . Other pancytopenia (HCC) 10/03/2013  . Gallstone pancreatitis 10/02/2013  . Gallstones 09/29/2013  . Non-traumatic compression fracture of L3 & L4 lumbar vertebra 08/24/2013  . Right knee pain 11/07/2012  . Hypertension 05/26/2012  . Hypokalemia  05/26/2012    Past Surgical History:  Procedure Laterality Date  . CATARACT EXTRACTION  2016  . CHOLECYSTECTOMY N/A 10/04/2013   Procedure: LAPAROSCOPIC CHOLECYSTECTOMY WITH INTRAOPERATIVE CHOLANGIOGRAM;  Surgeon: Axel Filler, MD;  Location: [facility] OR;  Service: General;  Laterality: N/A;  . EXTERNAL EAR SURGERY  years ago   right mastoidectomy    OB History    No data available       Home Medications    Prior to Admission medications   Medication Sig Start Date End Date Taking? Authorizing Provider  amLODipine (NORVASC) 10 MG tablet Take 1 tablet (10 mg total) by mouth daily. 09/21/16   Jaclyn Shaggy, MD  aspirin EC 81 MG tablet Take 81 mg by mouth daily as needed for mild pain or moderate pain.    [provider]  hydrALAZINE (APRESOLINE) 50 MG tablet Take 1 tablet (50 mg total) by mouth 3 (three) times daily. 09/21/16   Jaclyn Shaggy, MD  lactulose (CHRONULAC) 10 GM/15ML solution Take 15 mLs (10 g total) by mouth 3 (three) times daily. Patient not taking: Reported on 09/21/2016 10/18/15   Jaclyn Shaggy, MD  meclizine (ANTIVERT) 25 MG tablet Take 1 tablet (25 mg total) by mouth 2 (two) times daily as needed. 09/21/16   Jaclyn Shaggy, MD  naproxen (NAPROSYN) 500 MG tablet Take 1 tablet (500 mg total) by mouth 2 (two) times daily with a meal. 09/21/16   Jaclyn Shaggy, MD  propranolol (INDERAL) 20  MG tablet Take 1 tablet (20 mg total) by mouth 2 (two) times daily. 09/21/16   Jaclyn Shaggy, MD    Family History Family History  Problem Relation Age of Onset  . Diabetes Father     Social History Social History  Substance Use Topics  . Smoking status: Never Smoker  . Smokeless tobacco: Never Used  . Alcohol use No     Allergies   Feosol [iron]   Review of Systems Review of Systems  Constitutional: Negative for chills and fever.  Respiratory: Negative for chest tightness and shortness of breath.   Cardiovascular: Positive for leg swelling (R only). Negative for chest pain.   Gastrointestinal: Negative for abdominal pain.  Musculoskeletal: Positive for arthralgias, gait problem, joint swelling and myalgias.  Skin: Negative for color change and wound.  Allergic/Immunologic: Negative for immunocompromised state.  Neurological: Negative for numbness.  Hematological: Does not bruise/bleed easily.     Physical Exam Updated Vital Signs BP 115/67   Pulse 76   Temp 97.9 F (36.6 C) (Oral)   Resp 16   Ht  (1.422 m)   Wt 69 kg (152 lb 1.9 oz)   SpO2 96%   BMI 34.10 kg/m   Physical Exam  Constitutional: She is oriented to person, place, and time. She appears well-developed and well-nourished. No distress.  NAD.  HENT:  Head: Normocephalic and atraumatic.  Right Ear: External ear normal.  Left Ear: External ear normal.  Nose: Nose normal.  Eyes: Conjunctivae and EOM are normal. No scleral icterus.  Neck: Normal range of motion.  Cardiovascular: Normal rate, regular rhythm and normal heart sounds.   No murmur heard. Pulses:      Popliteal pulses are 2+ on the right side, and 2+ on the left side.       Dorsalis pedis pulses are 2+ on the right side, and 2+ on the left side.       Posterior tibial pulses are 2+ on the right side, and 2+ on the left side.  +Mild edema from ankle to mid foot, non pitting +R calf tenderness No varicosities seen Palpable pulses in LE  Pulmonary/Chest: Effort normal and breath sounds normal. She has no wheezes.  Musculoskeletal: Normal range of motion. She exhibits no deformity.  Neurological: She is alert and oriented to person, place, and time.  Sensation to light touch intact in LEs 5/5 strength with ankle F/E in LEs  Skin: Skin is warm and dry. Capillary refill takes less than 2 seconds.  No erythema, warmth, fluctuance of RLE No wounds to RLE RLE is warm  Psychiatric: She has a normal mood and affect. Her behavior is normal. Judgment and thought content normal.  Nursing note and vitals reviewed.    ED  Treatments / Results  Labs (all labs ordered are listed, but only abnormal results are displayed) Labs Reviewed - No data to display  EKG  EKG Interpretation None       Radiology Dg Foot Complete Right  Result Date: 11/20/2016 CLINICAL DATA:  Pain and swelling in the right foot EXAM: RIGHT FOOT COMPLETE - 3+ VIEW COMPARISON:  None. FINDINGS: Mild soft tissue swelling is noted. No acute bony abnormality is seen. IMPRESSION: Soft tissue swelling without bony abnormality. Electronically Signed   By: Alcide Clever M.D.   On: 11/20/2016 12:30    Procedures Procedures (including critical care time)  Medications Ordered in ED Medications  acetaminophen (TYLENOL) tablet 1,000 mg (1,000 mg Oral Given 11/20/16 1208)  Initial Impression / Assessment and Plan / ED Course  I have reviewed the triage vital signs and the nursing notes.  Pertinent labs & imaging results that were available during my care of the patient were reviewed by me and considered in my medical decision making (see chart for details).  Clinical Course as of Nov 21 1518  Tue Nov 20, 2016  1412 Vascular US currently being done  [CG]    Clinical Course User Index [CG] Liberty Handy, PA-C    66 y.o. year old female with h/o significant for well controlled HTN presents to ED for RLE edema and pain, most significant at right ankle x 1 week.  No fevers or chills. No h/o IVDU or immunosuppression. No h/o DVT/PE. No prosthetic joints. No preceding trauma, URI or GI illness.  No warmth, fluctuance or evidence of overlaying cellulitis. Only mild pain with full ROM. Initial differential diagnosis includes septic arthritis, reactive arthritis, gout, however higher suspicion for DVT or OA or soft tissue injury. No h/o high risk sexual practices, generalized rash or GU symptoms to raise suspicion for gonococcal arthritis. Will ice, elevate, treat pain and get x-ray and U/S.  X-ray and RLE U/S negative other than soft tissue  swelling. She has no CP, SOB, orthopnea, h/o HF, CAD.  Doubt cardiac etiology. Upon further questioning pt states bilateral legs swell occasionally, usually worse on the R and worse when standing and walking however never been this bad before. Elevation usually alleviates swelling. She has PCP. Will d/c with leg elevation, compression, tylenol and f/u with PCP for further evaluation. Suspect venous stasis.   Final Clinical Impressions(s) / ED Diagnoses   Final diagnoses:  Peripheral edema    New Prescriptions New Prescriptions   No medications on file       Jerrell Mylar 11/20/16 1520    Mesner, Barbara Cower, MD 11/20/16 1646

## 2016-11-20 NOTE — Progress Notes (Signed)
Preliminary results by tech - Venous Duplex Lower Ext. Completed. Negative for deep and superficial vein thrombosis in both legs.  Dalayah Deahl, BS, RDMS, RVT  

## 2016-11-20 NOTE — Discharge Instructions (Signed)
You came to ED for leg swelling, worst on your right.   Your x-ray and ultrasound were normal. No evidence of bony injury, arthritis, blood clot.   It is still unclear what is causing your symptoms.  May be from sluggish veins.   Please take tylenol 1000 mg every 8 hours for pain. Elevate your legs to decrease swelling. Avoid prolonged standing and walking.   Call your primary care doctor as soon as possible for re-evaluation of leg swelling.   Return to ED if you develop fevers, chills, redness, increased pain, numbness or wounds to your legs

## 2016-11-20 NOTE — ED Triage Notes (Signed)
Pt presents for evaluation of bilateral lower extremity edema x 1 week. Pt reports URI symptoms as well, denies cough/SOB/CP.

## 2016-11-27 ENCOUNTER — Telehealth: Payer: Self-pay

## 2016-11-27 ENCOUNTER — Other Ambulatory Visit: Payer: Self-pay | Admitting: Family Medicine

## 2016-11-27 DIAGNOSIS — M1711 Unilateral primary osteoarthritis, right knee: Secondary | ICD-10-CM

## 2016-11-27 NOTE — Telephone Encounter (Signed)
Signed application for CAP services faxed to [facility] 3 [phone]

## 2016-11-29 ENCOUNTER — Telehealth: Payer: Self-pay | Admitting: Family Medicine

## 2016-11-29 NOTE — Telephone Encounter (Signed)
Call placed to [facility] #[phone] from CAP services in order to verify if she had received the application for CAP services for patient that were faxed yesterday. Spoke with Marcelino Duster and she confirmed that they were received and have been uploaded to patient's file (in their system).

## 2016-12-27 ENCOUNTER — Ambulatory Visit: Payer: Medicaid Other | Attending: Family Medicine | Admitting: Family Medicine

## 2016-12-27 ENCOUNTER — Encounter: Payer: Self-pay | Admitting: Family Medicine

## 2016-12-27 VITALS — BP 144/75 | HR 77 | Temp 97.6°F | Ht 59.0 in | Wt 133.6 lb

## 2016-12-27 DIAGNOSIS — M25561 Pain in right knee: Secondary | ICD-10-CM | POA: Diagnosis not present

## 2016-12-27 DIAGNOSIS — J302 Other seasonal allergic rhinitis: Secondary | ICD-10-CM | POA: Insufficient documentation

## 2016-12-27 DIAGNOSIS — G8929 Other chronic pain: Secondary | ICD-10-CM | POA: Insufficient documentation

## 2016-12-27 DIAGNOSIS — Z8673 Personal history of transient ischemic attack (TIA), and cerebral infarction without residual deficits: Secondary | ICD-10-CM | POA: Insufficient documentation

## 2016-12-27 DIAGNOSIS — M1711 Unilateral primary osteoarthritis, right knee: Secondary | ICD-10-CM

## 2016-12-27 DIAGNOSIS — M7989 Other specified soft tissue disorders: Secondary | ICD-10-CM | POA: Insufficient documentation

## 2016-12-27 DIAGNOSIS — I10 Essential (primary) hypertension: Secondary | ICD-10-CM | POA: Diagnosis not present

## 2016-12-27 DIAGNOSIS — K219 Gastro-esophageal reflux disease without esophagitis: Secondary | ICD-10-CM | POA: Insufficient documentation

## 2016-12-27 DIAGNOSIS — M170 Bilateral primary osteoarthritis of knee: Secondary | ICD-10-CM | POA: Insufficient documentation

## 2016-12-27 MED ORDER — HYDRALAZINE HCL 50 MG PO TABS: 50.0000 mg | ORAL_TABLET | Freq: Three times a day (TID) | ORAL | 5 refills | Status: DC

## 2016-12-27 MED ORDER — CETIRIZINE HCL 10 MG PO TABS: 10.0000 mg | ORAL_TABLET | Freq: Every day | ORAL | 1 refills | Status: DC

## 2016-12-27 MED ORDER — NAPROXEN 500 MG PO TABS: 500.0000 mg | ORAL_TABLET | Freq: Two times a day (BID) | ORAL | 2 refills | Status: DC

## 2016-12-27 MED ORDER — AMLODIPINE BESYLATE 10 MG PO TABS: 10.0000 mg | ORAL_TABLET | Freq: Every day | ORAL | 5 refills | Status: DC

## 2016-12-27 MED ORDER — PREDNISONE 20 MG PO TABS: 20.0000 mg | ORAL_TABLET | Freq: Two times a day (BID) | ORAL | 0 refills | Status: DC

## 2016-12-27 NOTE — Progress Notes (Signed)
Subjective:  Patient ID: Michelle Boyd, female    DOB: 04/08/1950  Age: 66 y.o. MRN: [id number]  CC: Hypertension and Leg Swelling   HPI Michelle Boyd is a 66 year old female with a history of Hypertension, GERD, TIA osteoarthritis of the knees who presents today for follow-up visit and is seen with the aid of a Nepali video interpreter.  She complains of bilateral knee pain which is rated at an 8/10 and is sometimes associated with swelling for the last 2 weeks.  Pain is exacerbated by cold weather and she currently does not use any analgesics for relief. X-ray of the right knee from 06/2016 revealed tricompartmental degenerative changes of the right knee.  Her blood pressure is elevated and she endorses running out of her amlodipine but has been taking her hydralazine. She has also been compliant with a low-sodium diet but is unable to exercise due to pain in her knees.  Past Medical History:  Diagnosis Date  . Cholelithiasis   . Chronic knee pain   . Gallstone pancreatitis   . GERD (gastroesophageal reflux disease)   . Hypertension   . Memory loss     Past Surgical History:  Procedure Laterality Date  . CATARACT EXTRACTION  2016  . EXTERNAL EAR SURGERY  years ago   right mastoidectomy    Allergies  Allergen Reactions  . Feosol [Iron] Itching     Outpatient Medications Prior to Visit  Medication Sig Dispense Refill  . aspirin EC 81 MG tablet Take 81 mg by mouth daily as needed for mild pain or moderate pain.    Marland Kitchen. propranolol (INDERAL) 20 MG tablet Take 1 tablet (20 mg total) by mouth 2 (two) times daily. 60 tablet 5  . amLODipine (NORVASC) 10 MG tablet Take 1 tablet (10 mg total) by mouth daily. 30 tablet 5  . hydrALAZINE (APRESOLINE) 50 MG tablet Take 1 tablet (50 mg total) by mouth 3 (three) times daily. 90 tablet 5  . meclizine (ANTIVERT) 25 MG tablet Take 1 tablet (25 mg total) by mouth 2 (two) times daily as needed. 60 tablet 2  . naproxen (NAPROSYN) 500 MG tablet Take 1  tablet (500 mg total) by mouth 2 (two) times daily with a meal. 30 tablet 2  . lactulose (CHRONULAC) 10 GM/15ML solution Take 15 mLs (10 g total) by mouth 3 (three) times daily. (Patient not taking: Reported on 09/21/2016) 946 mL 1   No facility-administered medications prior to visit.     ROS Review of Systems  Constitutional: Negative for activity change, appetite change and fatigue.  HENT: Negative for congestion, sinus pressure and sore throat.   Eyes: Negative for visual disturbance.  Respiratory: Negative for cough, chest tightness, shortness of breath and wheezing.   Cardiovascular: Negative for chest pain and palpitations.  Gastrointestinal: Negative for abdominal distention, abdominal pain and constipation.  Endocrine: Negative for polydipsia.  Genitourinary: Negative for dysuria and frequency.  Musculoskeletal: Negative for arthralgias and back pain.  Skin: Negative for rash.  Neurological: Negative for tremors, light-headedness and numbness.  Hematological: Does not bruise/bleed easily.  Psychiatric/Behavioral: Negative for agitation and behavioral problems.    Objective:  BP (!) 144/75   Pulse 77   Temp 97.6 F (36.4 C) (Oral)   Ht 4\' 11"  (1.499 m)   Wt 133 lb 9.6 oz (60.6 kg)   SpO2 99%   BMI 26.98 kg/m   BP/Weight 12/27/2016 11/20/2016 09/21/2016  Systolic BP 144 115 150  Diastolic BP 75 66 70  Wt. (Lbs) 133.6 152.12 131.4  BMI 26.98 34.1 25.66      Physical Exam  Constitutional: She is oriented to person, place, and time. She appears well-developed and well-nourished.  Cardiovascular: Normal rate and intact distal pulses.  Murmur (2/6 systolic murmur) heard. Pulmonary/Chest: Effort normal and breath sounds normal. She has no wheezes. She has no rales. She exhibits no tenderness.  Abdominal: Soft. Bowel sounds are normal. She exhibits no distension and no mass. There is no tenderness.  Musculoskeletal: She exhibits edema (1+ bilateral ankle non pitting edema).   Normal appearance of both knees Crepitus and slight tenderness on active ROM  Neurological: She is alert and oriented to person, place, and time.  Skin: Skin is warm and dry.  Psychiatric: She has a normal mood and affect.     Assessment & Plan:   1. Essential hypertension Slightly elevated above goal of less than 130/80 which could be secondary to pain Blood pressure at his last visit was 115/60 - amLODipine (NORVASC) 10 MG tablet; Take 1 tablet (10 mg total) daily by mouth.  Dispense: 30 tablet; Refill: 5 - hydrALAZINE (APRESOLINE) 50 MG tablet; Take 1 tablet (50 mg total) 3 (three) times daily by mouth.  Dispense: 90 tablet; Refill: 5  2. Primary osteoarthritis of right knee X-ray from 06/2016 revealed tricompartmental degenerative changes in the right knee Received corticosteroid injections in the past If symptoms are controlled on current regimen and will refer her to orthopedics - naproxen (NAPROSYN) 500 MG tablet; Take 1 tablet (500 mg total) 2 (two) times daily with a meal by mouth.  Dispense: 30 tablet; Refill: 2 - predniSONE (DELTASONE) 20 MG tablet; Take 1 tablet (20 mg total) 2 (two) times daily with a meal by mouth.  Dispense: 10 tablet; Refill: 0  3. Seasonal allergies Stable - cetirizine (ZYRTEC) 10 MG tablet; Take 1 tablet (10 mg total) daily by mouth.  Dispense: 30 tablet; Refill: 1   Meds ordered this encounter  Medications  . amLODipine (NORVASC) 10 MG tablet    Sig: Take 1 tablet (10 mg total) daily by mouth.    Dispense:  30 tablet    Refill:  5  . hydrALAZINE (APRESOLINE) 50 MG tablet    Sig: Take 1 tablet (50 mg total) 3 (three) times daily by mouth.    Dispense:  90 tablet    Refill:  5  . naproxen (NAPROSYN) 500 MG tablet    Sig: Take 1 tablet (500 mg total) 2 (two) times daily with a meal by mouth.    Dispense:  30 tablet    Refill:  2  . cetirizine (ZYRTEC) 10 MG tablet    Sig: Take 1 tablet (10 mg total) daily by mouth.    Dispense:  30 tablet      Refill:  1  . predniSONE (DELTASONE) 20 MG tablet    Sig: Take 1 tablet (20 mg total) 2 (two) times daily with a meal by mouth.    Dispense:  10 tablet    Refill:  0    Follow-up: Return in about 3 months (around 03/29/2017) for follow up of Hypertension arthritis.   Jaclyn ShaggyEnobong Amao MD

## 2016-12-30 IMAGING — CR DG CHEST 2V
2 series · 2 of 2 positions shown · non-contrast
Comparison: October 16, 2014

CLINICAL DATA: Chest discomfort and shortness of breath

EXAM:
CHEST  2 VIEW

[chest pa]
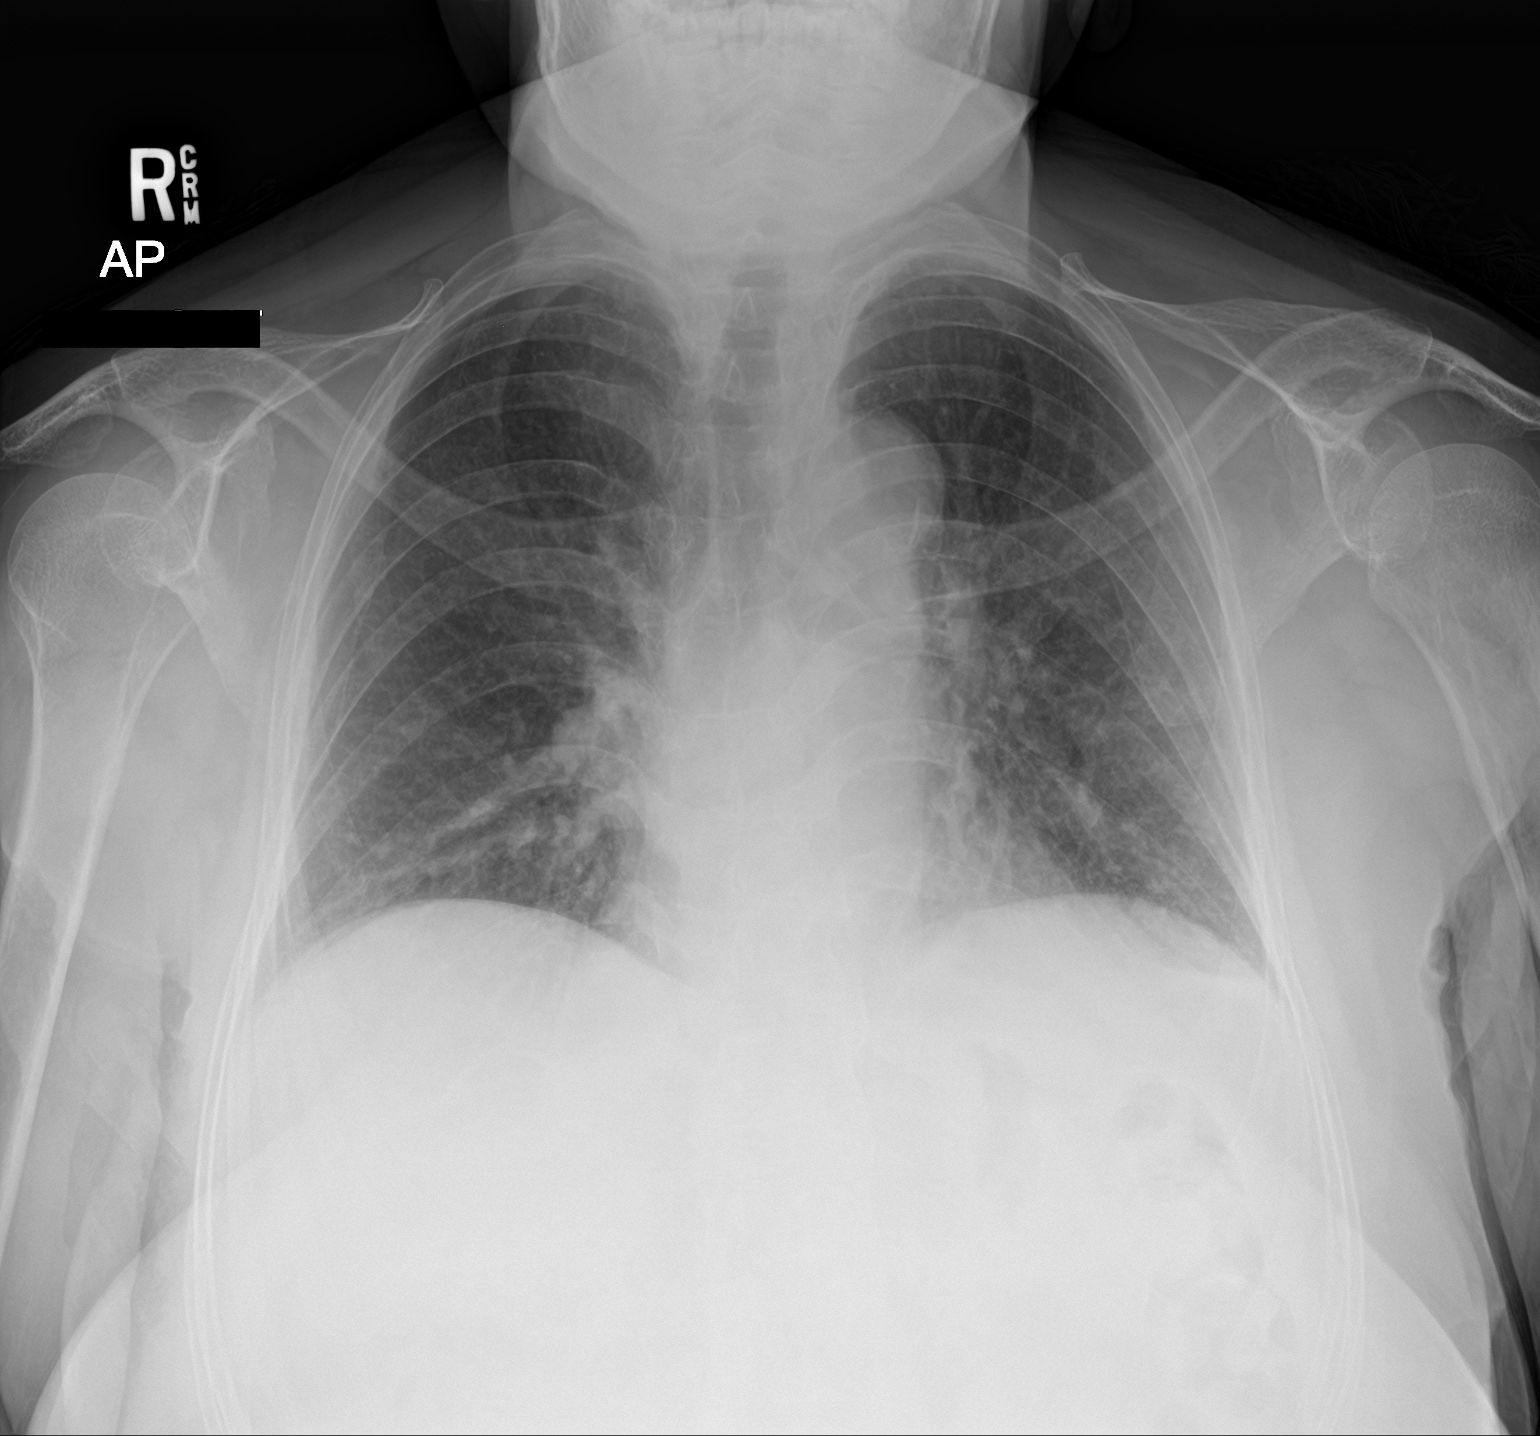

[chest lat]
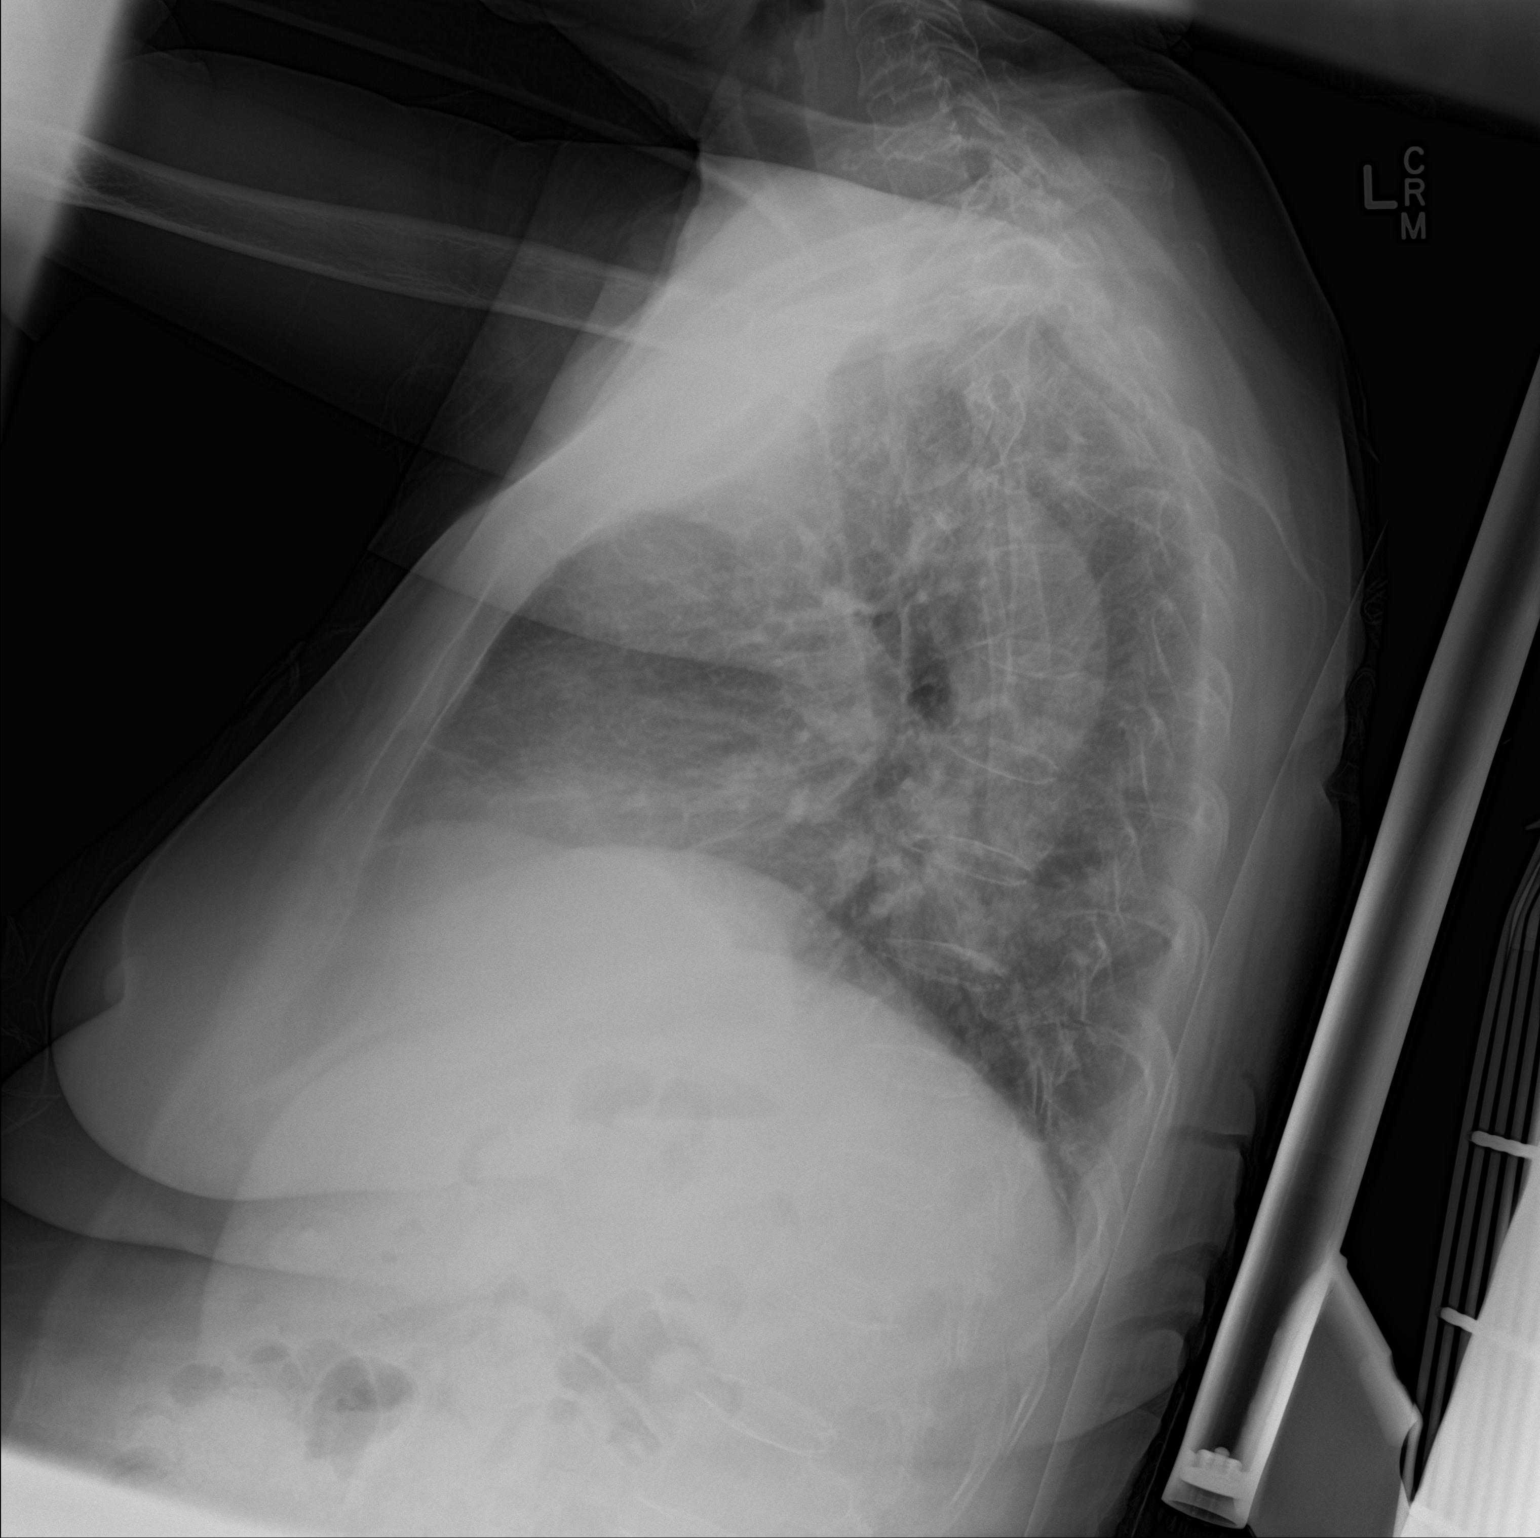

[2 of 2 positions shown; findings below may reference images not displayed]

FINDINGS: There is no edema or consolidation. The heart size and pulmonary
vascularity are normal. No adenopathy. There is atherosclerotic
calcification in the aorta. No bone lesions.
IMPRESSION: Aortic atherosclerosis.  No edema or consolidation.

## 2017-02-10 ENCOUNTER — Emergency Department (HOSPITAL_COMMUNITY)
Admission: EM | Admit: 2017-02-10 | Discharge: 2017-02-10 | Disposition: A | Discharge status: Home / Self Care | Payer: Medicaid Other | Attending: Emergency Medicine | Admitting: Emergency Medicine

## 2017-02-10 ENCOUNTER — Encounter (HOSPITAL_COMMUNITY): Payer: Self-pay | Admitting: *Deleted

## 2017-02-10 ENCOUNTER — Other Ambulatory Visit: Payer: Self-pay

## 2017-02-10 ENCOUNTER — Emergency Department (HOSPITAL_COMMUNITY): Payer: Medicaid Other

## 2017-02-10 DIAGNOSIS — Z79899 Other long term (current) drug therapy: Secondary | ICD-10-CM | POA: Insufficient documentation

## 2017-02-10 DIAGNOSIS — R2243 Localized swelling, mass and lump, lower limb, bilateral: Secondary | ICD-10-CM | POA: Insufficient documentation

## 2017-02-10 DIAGNOSIS — I10 Essential (primary) hypertension: Secondary | ICD-10-CM | POA: Insufficient documentation

## 2017-02-10 DIAGNOSIS — M7989 Other specified soft tissue disorders: Secondary | ICD-10-CM

## 2017-02-10 DIAGNOSIS — R609 Edema, unspecified: Secondary | ICD-10-CM

## 2017-02-10 LAB — CBC
HEMATOCRIT: 32.8 % — AB (ref 36.0–46.0)
HEMOGLOBIN: 10.2 g/dL — AB (ref 12.0–15.0)
MCH: 25.2 pg — ABNORMAL LOW (ref 26.0–34.0)
MCHC: 31.1 g/dL (ref 30.0–36.0)
MCV: 81 fL (ref 78.0–100.0)
Platelets: 201 10*3/uL (ref 150–400)
RBC: 4.05 MIL/uL (ref 3.87–5.11)
RDW: 14.2 % (ref 11.5–15.5)
WBC: 4.5 10*3/uL (ref 4.0–10.5)

## 2017-02-10 LAB — BASIC METABOLIC PANEL
ANION GAP: 7 (ref 5–15)
BUN: 9 mg/dL (ref 6–20)
CHLORIDE: 108 mmol/L (ref 101–111)
CO2: 23 mmol/L (ref 22–32)
Calcium: 8.8 mg/dL — ABNORMAL LOW (ref 8.9–10.3)
Creatinine, Ser: 0.72 mg/dL (ref 0.44–1.00)
Glucose, Bld: 104 mg/dL — ABNORMAL HIGH (ref 65–99)
POTASSIUM: 3.5 mmol/L (ref 3.5–5.1)
SODIUM: 138 mmol/L (ref 135–145)

## 2017-02-10 LAB — BRAIN NATRIURETIC PEPTIDE: B NATRIURETIC PEPTIDE 5: 23.6 pg/mL (ref 0.0–100.0)

## 2017-02-10 LAB — I-STAT TROPONIN, ED: Troponin i, poc: 0 ng/mL (ref 0.00–0.08)

## 2017-02-10 MED ORDER — FUROSEMIDE 20 MG PO TABS: 20.0000 mg | ORAL_TABLET | Freq: Every day | ORAL | 0 refills | Status: DC

## 2017-02-10 NOTE — ED Triage Notes (Signed)
Family reports bilateral leg swelling x 2 weeks with sob. No acute resp distress is noted at triage.

## 2017-02-10 NOTE — ED Notes (Signed)
Dr Miller at bedside at this time.

## 2017-02-10 NOTE — Discharge Instructions (Signed)
Your swelling seems to be related to the medication that you take for blood pressure.  Please have your doctor discussed with you alternatives to taking amlodipine as this may cause swelling.  You may take furosemide 20 mg daily for 5 days to help with some of the swelling.  This will make you urinate frequently  Emergency department for severe or worsening symptoms

## 2017-02-10 NOTE — ED Notes (Signed)
Patient transported to X-ray 

## 2017-02-10 NOTE — ED Provider Notes (Signed)
[facility] Provider Note   CSN: [id number] Arrival date & time: 02/10/17  1012     History   Chief Complaint Chief Complaint  Patient presents with  . Leg Swelling  . Shortness of Breath    HPI Michelle Alleen BorneGurung is a 66 y.o. female.  HPI  The patient is a pleasant 66 year old [place] female who is accompanied by her family member, she has a known history of hypertension for which she takes amlodipine, she denies having any other significant chronic medical conditions.  She reports approximately 2 weeks of gradual onset and persistent gradually worsening swelling of the bilateral lower extremities which is symmetrical, she has associated shortness of breath when she sits up and walks but not when she lays down.  She has no orthopnea, paroxysmal nocturnal dyspnea or any chest pain at all.  She does have chronic back pain for many years.  She denies any recent trauma trips surgery travel immobilization or exogenous estrogen use.  She does not smoke cigarettes and denies alcohol use.  The patient has not had any difficulty with urination defecation and denies any abdominal pain.  She has never had a cardiac workup, does not see a cardiologist, she sees her family doctor at the community clinic regularly.  Past Medical History:  Diagnosis Date  . Cholelithiasis   . Chronic knee pain   . Gallstone pancreatitis   . GERD (gastroesophageal reflux disease)   . Hypertension   . Memory loss     Patient Active Problem List   Diagnosis Date Noted  . Osteoarthritis 09/21/2016  . Memory loss 01/11/2016  . Dissection of carotid artery (HCC)   . Right sided weakness   . Hemiplegic migraine without status migrainosus, not intractable   . Right facial numbness   . Right arm numbness   . Right leg numbness   . Systolic murmur   . Dizziness   . Transient cerebral ischemia 12/03/2015  . Bilateral chronic knee pain 11/11/2014  . AKI (acute kidney injury)  (HCC) 10/22/2013  . Sepsis secondary to UTI (HCC) 10/22/2013  . Acute renal failure (HCC) 10/22/2013  . Pyelonephritis, acute 10/22/2013  . Iron (Fe) deficiency anemia 10/22/2013  . Elevated LFTs 10/22/2013  . Microcytic anemia 10/05/2013  . Cholecystitis, acute 10/04/2013  . Other pancytopenia (HCC) 10/03/2013  . Gallstone pancreatitis 10/02/2013  . Gallstones 09/29/2013  . Non-traumatic compression fracture of L3 & L4 lumbar vertebra 08/24/2013  . Right knee pain 11/07/2012  . Hypertension 05/26/2012  . Hypokalemia 05/26/2012    Past Surgical History:  Procedure Laterality Date  . CATARACT EXTRACTION  2016  . CHOLECYSTECTOMY N/A 10/04/2013   Procedure: LAPAROSCOPIC CHOLECYSTECTOMY WITH INTRAOPERATIVE CHOLANGIOGRAM;  Surgeon: Axel FillerArmando Ramirez, MD;  Location: [facility] OR;  Service: General;  Laterality: N/A;  . EXTERNAL EAR SURGERY  years ago   right mastoidectomy    OB History    No data available       Home Medications    Prior to Admission medications   Medication Sig Start Date End Date Taking? Authorizing Provider  amLODipine (NORVASC) 10 MG tablet Take 1 tablet (10 mg total) daily by mouth. 12/27/16   Jaclyn ShaggyAmao, Enobong, MD  aspirin EC 81 MG tablet Take 81 mg by mouth daily as needed for mild pain or moderate pain.    [provider]  cetirizine (ZYRTEC) 10 MG tablet Take 1 tablet (10 mg total) daily by mouth. 12/27/16   Jaclyn ShaggyAmao, Enobong, MD  hydrALAZINE (APRESOLINE) 50  MG tablet Take 1 tablet (50 mg total) 3 (three) times daily by mouth. 12/27/16   Jaclyn ShaggyAmao, Enobong, MD  lactulose (CHRONULAC) 10 GM/15ML solution Take 15 mLs (10 g total) by mouth 3 (three) times daily. Patient not taking: Reported on 09/21/2016 10/18/15   Jaclyn ShaggyAmao, Enobong, MD  naproxen (NAPROSYN) 500 MG tablet Take 1 tablet (500 mg total) 2 (two) times daily with a meal by mouth. 12/27/16   Jaclyn ShaggyAmao, Enobong, MD  predniSONE (DELTASONE) 20 MG tablet Take 1 tablet (20 mg total) 2 (two) times daily with a meal by mouth. 12/27/16    Jaclyn ShaggyAmao, Enobong, MD  propranolol (INDERAL) 20 MG tablet Take 1 tablet (20 mg total) by mouth 2 (two) times daily. 09/21/16   Jaclyn ShaggyAmao, Enobong, MD    Family History Family History  Problem Relation Age of Onset  . Diabetes Father     Social History Social History   Tobacco Use  . Smoking status: Never Smoker  . Smokeless tobacco: Never Used  Substance Use Topics  . Alcohol use: No  . Drug use: No     Allergies   Feosol [iron]   Review of Systems Review of Systems  All other systems reviewed and are negative.    Physical Exam Updated Vital Signs BP 132/71 (BP Location: Left Arm)   Pulse 86   Temp (!) 97.4 F (36.3 C) (Oral)   Resp 18   SpO2 100%   Physical Exam  Constitutional: She appears well-developed and well-nourished. No distress.  HENT:  Head: Normocephalic and atraumatic.  Mouth/Throat: Oropharynx is clear and moist. No oropharyngeal exudate.  Eyes: Conjunctivae and EOM are normal. Pupils are equal, round, and reactive to light. Right eye exhibits no discharge. Left eye exhibits no discharge. No scleral icterus.  Neck: Normal range of motion. Neck supple. No JVD present. No thyromegaly present.  Cardiovascular: Normal rate, regular rhythm and intact distal pulses. Exam reveals no gallop and no friction rub.  Murmur ( Early systolic murmur, 2+) heard. No JVD  Pulmonary/Chest: Effort normal and breath sounds normal. No respiratory distress. She has no wheezes. She has no rales.  No rales wheezing or rhonchi, no increased work of breathing  Abdominal: Soft. Bowel sounds are normal. She exhibits no distension and no mass. There is no tenderness.  Musculoskeletal: Normal range of motion. She exhibits edema. She exhibits no tenderness.  2+ pitting edema from the knees to the ankles, symmetrical  Lymphadenopathy:    She has no cervical adenopathy.  Neurological: She is alert. Coordination normal.  Skin: Skin is warm and dry. No rash noted. No erythema.    Psychiatric: She has a normal mood and affect. Her behavior is normal.  Nursing note and vitals reviewed.    ED Treatments / Results  Labs (all labs ordered are listed, but only abnormal results are displayed) Labs Reviewed  CBC - Abnormal; Notable for the following components:      Result Value   Hemoglobin 10.2 (*)    HCT 32.8 (*)    MCH 25.2 (*)    All other components within normal limits  BASIC METABOLIC PANEL  BRAIN NATRIURETIC PEPTIDE  I-STAT TROPONIN, ED    EKG  EKG Interpretation  Date/Time:  Sunday February 10 2017 10:20:55 EST Ventricular Rate:  76 PR Interval:  148 QRS Duration: 72 QT Interval:  408 QTC Calculation: 459 R Axis:   3 Text Interpretation:  Normal sinus rhythm Low voltage QRS Borderline ECG since last tracing no significant change Confirmed by  Eber Hong ([id number]) on 02/10/2017 11:16:54 AM       Radiology No results found.  Procedures Procedures (including critical care time)  Medications Ordered in ED Medications - No data to display   Initial Impression / Assessment and Plan / ED Course  I have reviewed the triage vital signs and the nursing notes.  Pertinent labs & imaging results that were available during my care of the patient were reviewed by me and considered in my medical decision making (see chart for details).  Clinical Course as of Feb 11 1215  Sun Feb 10, 2017  1118 Further review of the patient's record shows that she has had multiple lower extremity vascular Dopplers which have not shown DVT over the last year, she also had an echocardiogram over a year ago which did not show any significant valvular disease nor was there any CHF.  [BM]    Clinical Course User Index [BM] Eber Hong, MD    The patient is well-appearing, she does not appear to have any JVD however she does have an abnormal heart murmur, she has bilateral lower extremity edema and with her shortness of breath I would suspect there could be some  component of congestive heart failure however she is also on a calcium channel blocker well known to cause peripheral edema.  This is not unilateral, there is no redness to suggest infection, will obtain a BNP as well as some basic labs and EKG to further evaluate.  Chest x-ray to look for pulmonary edema, vital signs are totally unremarkable with oxygen of 100% and she is normotensive.  Final Clinical Impressions(s) / ED Diagnoses   Final diagnoses:  Peripheral edema  Leg swelling    ED Discharge Orders        Ordered    furosemide (LASIX) 20 MG tablet  Daily     02/10/17 1237       Eber Hong, MD 02/10/17 1238

## 2017-02-22 ENCOUNTER — Telehealth: Payer: Self-pay | Admitting: Family Medicine

## 2017-02-22 DIAGNOSIS — J302 Other seasonal allergic rhinitis: Secondary | ICD-10-CM

## 2017-02-22 DIAGNOSIS — M1711 Unilateral primary osteoarthritis, right knee: Secondary | ICD-10-CM

## 2017-02-22 MED ORDER — CETIRIZINE HCL 10 MG PO TABS: 10.0000 mg | ORAL_TABLET | Freq: Every day | ORAL | 0 refills | Status: DC

## 2017-02-22 MED ORDER — NAPROXEN 500 MG PO TABS: 500.0000 mg | ORAL_TABLET | Freq: Two times a day (BID) | ORAL | 0 refills | Status: DC

## 2017-02-22 NOTE — Telephone Encounter (Signed)
Pt came to the office to request a refill for cetirizine (ZYRTEC) 10 MG tablet naproxen (NAPROSYN) 500 MG tablet Please sent it to [facility] on [address], please follow up

## 2017-02-22 NOTE — Telephone Encounter (Signed)
Refilled

## 2017-03-13 ENCOUNTER — Telehealth: Payer: Self-pay | Admitting: Family Medicine

## 2017-03-13 NOTE — Telephone Encounter (Signed)
Refilled 02/22/17 for 15 day supply. Will forward to PCP

## 2017-03-13 NOTE — Telephone Encounter (Signed)
Pt came to the office to request a refill for naproxen (NAPROSYN) 500 MG tablet [phone] - [place], [place] - [address] [facility] [address],  Please follow up

## 2017-03-14 ENCOUNTER — Telehealth: Payer: Self-pay | Admitting: Family Medicine

## 2017-03-14 DIAGNOSIS — M1711 Unilateral primary osteoarthritis, right knee: Secondary | ICD-10-CM

## 2017-03-14 NOTE — Telephone Encounter (Signed)
Will defer to PCP

## 2017-03-14 NOTE — Telephone Encounter (Signed)
Patient called asking for medication refill on naproxen 500 mg. Please send to [facility] on [address]. Please fu

## 2017-03-15 MED ORDER — NAPROXEN 500 MG PO TABS: 500.0000 mg | ORAL_TABLET | Freq: Two times a day (BID) | ORAL | 1 refills | Status: DC

## 2017-03-15 NOTE — Telephone Encounter (Signed)
Refilled

## 2017-03-15 NOTE — Telephone Encounter (Signed)
I called the patient back to inform her that the medication was sent to [facility]

## 2017-03-29 ENCOUNTER — Other Ambulatory Visit: Payer: Self-pay

## 2017-03-29 ENCOUNTER — Ambulatory Visit: Payer: Medicaid Other | Admitting: Family Medicine

## 2017-03-29 ENCOUNTER — Ambulatory Visit (HOSPITAL_COMMUNITY)
Admission: RE | Admit: 2017-03-29 | Discharge: 2017-03-29 | Disposition: A | Discharge status: Home / Self Care | Payer: Medicaid Other | Source: Ambulatory Visit | Attending: Family Medicine | Admitting: Family Medicine

## 2017-03-29 ENCOUNTER — Encounter: Payer: Self-pay | Admitting: Family Medicine

## 2017-03-29 ENCOUNTER — Ambulatory Visit: Payer: Medicaid Other | Attending: Family Medicine | Admitting: Family Medicine

## 2017-03-29 ENCOUNTER — Other Ambulatory Visit: Payer: Self-pay | Admitting: Family Medicine

## 2017-03-29 VITALS — BP 191/78 | HR 68 | Temp 97.2°F | Ht 59.0 in | Wt 131.4 lb

## 2017-03-29 DIAGNOSIS — M170 Bilateral primary osteoarthritis of knee: Secondary | ICD-10-CM | POA: Diagnosis not present

## 2017-03-29 DIAGNOSIS — R600 Localized edema: Secondary | ICD-10-CM | POA: Diagnosis not present

## 2017-03-29 DIAGNOSIS — Z79899 Other long term (current) drug therapy: Secondary | ICD-10-CM | POA: Diagnosis not present

## 2017-03-29 DIAGNOSIS — Z8673 Personal history of transient ischemic attack (TIA), and cerebral infarction without residual deficits: Secondary | ICD-10-CM | POA: Insufficient documentation

## 2017-03-29 DIAGNOSIS — I10 Essential (primary) hypertension: Secondary | ICD-10-CM

## 2017-03-29 DIAGNOSIS — K219 Gastro-esophageal reflux disease without esophagitis: Secondary | ICD-10-CM | POA: Diagnosis not present

## 2017-03-29 MED ORDER — TRAMADOL HCL 50 MG PO TABS: 50.0000 mg | ORAL_TABLET | Freq: Three times a day (TID) | ORAL | 0 refills | Status: DC | PRN

## 2017-03-29 MED ORDER — LOSARTAN POTASSIUM-HCTZ 100-25 MG PO TABS: 1.0000 | ORAL_TABLET | Freq: Every day | ORAL | 6 refills | Status: DC

## 2017-03-29 NOTE — Patient Instructions (Signed)

## 2017-03-29 NOTE — Progress Notes (Signed)
Patient has been out of medication for a while now.

## 2017-03-29 NOTE — Progress Notes (Signed)
Subjective:  Patient ID: Michelle Boyd, female    DOB: Jul 01, 1950  Age: 67 y.o. MRN: [id number]  CC: Hypertension and Knee Pain   HPI Michelle Boyd  is a 67 year old female with a history of Hypertension, GERD, TIA osteoarthritis of the knees who presents today accompanied by her daughter for follow-up visit and is seen with the aid of a Nepali video interpreter.  She complains of severe bilateral knee pain which is relieved by taking naproxen and was sent by the cold weather.  Pain does not radiate.  Right knee x-ray from 06/2016 revealed tricompartmental degenerative changes without acute abnormality. In the past she received cortisone injection with minimal improvement.  Her blood pressure is elevated today and on review of her medications she only has hydralazine with her but not amlodipine.  Review of ED notes indicate she had a visit for pedal edema in 01/2017. She still complains of bilateral pedal edema at this time but denies shortness of breath, chest pains, orthopnea.  She endorses some lightheadedness but no blurry vision. Her blood pressure is extremely elevated today at 191/78 and she endorses taking her hydralazine this morning.  Past Medical History:  Diagnosis Date  . Cholelithiasis   . Chronic knee pain   . Gallstone pancreatitis   . GERD (gastroesophageal reflux disease)   . Hypertension   . Memory loss     Past Surgical History:  Procedure Laterality Date  . CATARACT EXTRACTION  2016  . CHOLECYSTECTOMY N/A 10/04/2013   Procedure: LAPAROSCOPIC CHOLECYSTECTOMY WITH INTRAOPERATIVE CHOLANGIOGRAM;  Surgeon: Axel Filler, MD;  Location: [facility] OR;  Service: General;  Laterality: N/A;  . EXTERNAL EAR SURGERY  years ago   right mastoidectomy    Allergies  Allergen Reactions  . Feosol [Iron] Itching     Outpatient Medications Prior to Visit  Medication Sig Dispense Refill  . hydrALAZINE (APRESOLINE) 50 MG tablet Take 1 tablet (50 mg total) 3 (three) times daily by mouth.  90 tablet 5  . naproxen (NAPROSYN) 500 MG tablet Take 1 tablet (500 mg total) by mouth 2 (two) times daily with a meal. 30 tablet 1  . aspirin EC 81 MG tablet Take 81 mg by mouth daily as needed for mild pain or moderate pain.    . cetirizine (ZYRTEC) 10 MG tablet Take 1 tablet (10 mg total) by mouth daily. (Patient not taking: Reported on 03/29/2017) 30 tablet 0  . lactulose (CHRONULAC) 10 GM/15ML solution Take 15 mLs (10 g total) by mouth 3 (three) times daily. (Patient not taking: Reported on 09/21/2016) 946 mL 1  . amLODipine (NORVASC) 10 MG tablet Take 1 tablet (10 mg total) daily by mouth. (Patient not taking: Reported on 03/29/2017) 30 tablet 5  . furosemide (LASIX) 20 MG tablet Take 1 tablet (20 mg total) by mouth daily. (Patient not taking: Reported on 03/29/2017) 5 tablet 0  . predniSONE (DELTASONE) 20 MG tablet Take 1 tablet (20 mg total) 2 (two) times daily with a meal by mouth. (Patient not taking: Reported on 03/29/2017) 10 tablet 0  . propranolol (INDERAL) 20 MG tablet Take 1 tablet (20 mg total) by mouth 2 (two) times daily. (Patient not taking: Reported on 03/29/2017) 60 tablet 5   No facility-administered medications prior to visit.     ROS Review of Systems  Constitutional: Negative for activity change, appetite change and fatigue.  HENT: Negative for congestion, sinus pressure and sore throat.   Eyes: Negative for visual disturbance.  Respiratory: Negative for cough,  chest tightness, shortness of breath and wheezing.   Cardiovascular: Positive for leg swelling. Negative for chest pain and palpitations.  Gastrointestinal: Negative for abdominal distention, abdominal pain and constipation.  Endocrine: Negative for polydipsia.  Genitourinary: Negative for dysuria and frequency.  Musculoskeletal:       See hpi  Skin: Negative for rash.  Neurological: Positive for light-headedness. Negative for tremors and numbness.  Hematological: Does not bruise/bleed easily.  Psychiatric/Behavioral:  Negative for agitation and behavioral problems.    Objective:  BP (!) 191/78   Pulse 68   Temp (!) 97.2 F (36.2 C) (Oral)   Ht 4\' 11"  (1.499 m)   Wt 131 lb 6.4 oz (59.6 kg)   SpO2 99%   BMI 26.54 kg/m   BP/Weight 03/29/2017 02/10/2017 12/27/2016  Systolic BP 191 122 144  Diastolic BP 78 63 75  Wt. (Lbs) 131.4 - 133.6  BMI 26.54 - 26.98      Physical Exam  Constitutional: She is oriented to person, place, and time. She appears well-developed and well-nourished.  Cardiovascular: Normal rate.  Murmur (2/6 systolic) heard. Pulmonary/Chest: Effort normal and breath sounds normal. She has no wheezes. She has no rales. She exhibits no tenderness.  Abdominal: Soft. Bowel sounds are normal. She exhibits no distension and no mass. There is no tenderness.  Musculoskeletal: She exhibits edema (1+ bilateral pitting edema).  Bilateral knee crepitus and tenderness on range of motion  Neurological: She is alert and oriented to person, place, and time.  Skin: Skin is warm and dry.  Psychiatric: She has a normal mood and affect.     Assessment & Plan:   1. Essential hypertension Uncontrolled Unable to give clonidine due to low heart rate We will add losartan/hydrochlorothiazide to regimen - losartan-hydrochlorothiazide (HYZAAR) 100-25 MG tablet; Take 1 tablet by mouth daily.  Dispense: 30 tablet; Refill: 6  2. Primary osteoarthritis of both knees Uncontrolled Tried cortisone injection in the past Will refer to orthopedics and she might have evidence of end-stage degenerative joint disease - DG Knee Complete 4 Views Left; Future - AMB referral to orthopedics - traMADol (ULTRAM) 50 MG tablet; Take 1 tablet (50 mg total) by mouth every 8 (eight) hours as needed.  Dispense: 30 tablet; Refill: 0  3. Pedal edema She currently does not take amlodipine which was thought to be the culprit Hopefully addition of lisinopril/hydrochlorothiazide will help with symptoms She has no Elevate feet,  use compression stockings, low-sodium diet.  Dyspnea or other symptoms suggestive of cardiac disease.     Meds ordered this encounter  Medications  . losartan-hydrochlorothiazide (HYZAAR) 100-25 MG tablet    Sig: Take 1 tablet by mouth daily.    Dispense:  30 tablet    Refill:  6  . traMADol (ULTRAM) 50 MG tablet    Sig: Take 1 tablet (50 mg total) by mouth every 8 (eight) hours as needed.    Dispense:  30 tablet    Refill:  0    Follow-up: Return in about 3 months (around 06/26/2017) for follow up of chronic medical conditions.   Hoy RegisterEnobong Ellison Rieth MD

## 2017-04-16 ENCOUNTER — Ambulatory Visit (INDEPENDENT_AMBULATORY_CARE_PROVIDER_SITE_OTHER): Payer: Self-pay | Admitting: Orthopaedic Surgery

## 2017-04-20 ENCOUNTER — Other Ambulatory Visit: Payer: Self-pay

## 2017-04-20 ENCOUNTER — Encounter (HOSPITAL_COMMUNITY): Payer: Self-pay

## 2017-04-20 DIAGNOSIS — R600 Localized edema: Secondary | ICD-10-CM | POA: Insufficient documentation

## 2017-04-20 DIAGNOSIS — Z7982 Long term (current) use of aspirin: Secondary | ICD-10-CM | POA: Insufficient documentation

## 2017-04-20 DIAGNOSIS — Z79899 Other long term (current) drug therapy: Secondary | ICD-10-CM | POA: Diagnosis not present

## 2017-04-20 DIAGNOSIS — I10 Essential (primary) hypertension: Secondary | ICD-10-CM | POA: Diagnosis not present

## 2017-04-20 DIAGNOSIS — M791 Myalgia, unspecified site: Secondary | ICD-10-CM | POA: Insufficient documentation

## 2017-04-20 LAB — URINALYSIS, ROUTINE W REFLEX MICROSCOPIC
Bacteria, UA: NONE SEEN
Bilirubin Urine: NEGATIVE
GLUCOSE, UA: NEGATIVE mg/dL
KETONES UR: NEGATIVE mg/dL
Nitrite: NEGATIVE
PH: 6 (ref 5.0–8.0)
Protein, ur: NEGATIVE mg/dL
SPECIFIC GRAVITY, URINE: 1.002 — AB (ref 1.005–1.030)

## 2017-04-20 LAB — CBC WITH DIFFERENTIAL/PLATELET
Basophils Absolute: 0 10*3/uL (ref 0.0–0.1)
Basophils Relative: 0 %
EOS ABS: 0.2 10*3/uL (ref 0.0–0.7)
Eosinophils Relative: 4 %
HCT: 28.7 % — ABNORMAL LOW (ref 36.0–46.0)
Hemoglobin: 8.8 g/dL — ABNORMAL LOW (ref 12.0–15.0)
LYMPHS ABS: 1.4 10*3/uL (ref 0.7–4.0)
Lymphocytes Relative: 31 %
MCH: 22.6 pg — ABNORMAL LOW (ref 26.0–34.0)
MCHC: 30.7 g/dL (ref 30.0–36.0)
MCV: 73.8 fL — AB (ref 78.0–100.0)
MONO ABS: 0.2 10*3/uL (ref 0.1–1.0)
Monocytes Relative: 5 %
NEUTROS PCT: 60 %
Neutro Abs: 2.7 10*3/uL (ref 1.7–7.7)
PLATELETS: 179 10*3/uL (ref 150–400)
RBC: 3.89 MIL/uL (ref 3.87–5.11)
RDW: 16.5 % — AB (ref 11.5–15.5)
WBC: 4.5 10*3/uL (ref 4.0–10.5)

## 2017-04-20 LAB — COMPREHENSIVE METABOLIC PANEL
ALT: 11 U/L — AB (ref 14–54)
AST: 18 U/L (ref 15–41)
Albumin: 3.3 g/dL — ABNORMAL LOW (ref 3.5–5.0)
Alkaline Phosphatase: 83 U/L (ref 38–126)
Anion gap: 9 (ref 5–15)
BILIRUBIN TOTAL: 0.5 mg/dL (ref 0.3–1.2)
BUN: 11 mg/dL (ref 6–20)
CALCIUM: 8.6 mg/dL — AB (ref 8.9–10.3)
CO2: 20 mmol/L — ABNORMAL LOW (ref 22–32)
CREATININE: 0.82 mg/dL (ref 0.44–1.00)
Chloride: 107 mmol/L (ref 101–111)
GFR calc non Af Amer: >60 mL/min (ref >60)
Glucose, Bld: 138 mg/dL — ABNORMAL HIGH (ref 65–99)
Potassium: 3.6 mmol/L (ref 3.5–5.1)
Sodium: 136 mmol/L (ref 135–145)
Total Protein: 6.7 g/dL (ref 6.5–8.1)

## 2017-04-20 NOTE — ED Triage Notes (Addendum)
Via nepali interpreter pt endorses bodyaches and bilateral knee swelling/pain x 5 days. Poor historian. VSS. Denies recent illness.

## 2017-04-21 ENCOUNTER — Emergency Department (HOSPITAL_COMMUNITY): Payer: Medicaid Other

## 2017-04-21 ENCOUNTER — Emergency Department (HOSPITAL_COMMUNITY)
Admission: EM | Admit: 2017-04-21 | Discharge: 2017-04-21 | Disposition: A | Discharge status: Home / Self Care | Payer: Medicaid Other | Attending: Emergency Medicine | Admitting: Emergency Medicine

## 2017-04-21 DIAGNOSIS — M791 Myalgia, unspecified site: Secondary | ICD-10-CM

## 2017-04-21 LAB — I-STAT TROPONIN, ED: TROPONIN I, POC: 0 ng/mL (ref 0.00–0.08)

## 2017-04-21 LAB — BRAIN NATRIURETIC PEPTIDE: B Natriuretic Peptide: 20.9 pg/mL (ref 0.0–100.0)

## 2017-04-21 MED ORDER — ACETAMINOPHEN 500 MG PO TABS: 1000.0000 mg | ORAL_TABLET | Freq: Once | ORAL | Status: DC

## 2017-04-21 NOTE — Discharge Instructions (Signed)
You may alternate Tylenol 1000 mg every 6 hours as needed for pain and Ibuprofen 800 mg every 8 hours as needed for pain.  Please take Ibuprofen with food. ° °

## 2017-04-21 NOTE — ED Provider Notes (Signed)
TIME SEEN: 2:31 AM  CHIEF COMPLAINT: Body aches  HPI: Patient is a 67 year old female with history of hypertension who presents to the emergency department with body aches for the past week.  States pain is in her joints, arms and legs.  She has noticed some swelling in her arms and legs mostly when she is getting up and moving around.  She denies any fever.  No chest pain or shortness of breath.  No sore throat or cough.  No vomiting or diarrhea.  No abdominal pain.  No injury to her extremities.  Did not have an influenza vaccination this year.  N[place]ali interpreter used throughout visit.  ROS: See HPI Constitutional: no fever  Eyes: no drainage  ENT: no runny nose   Cardiovascular:  no chest pain  Resp: no SOB  GI: no vomiting GU: no dysuria Integumentary: no rash  Allergy: no hives  Musculoskeletal:  leg swelling  Neurological: no slurred speech ROS otherwise negative  PAST MEDICAL HISTORY/PAST SURGICAL HISTORY:  Past Medical History:  Diagnosis Date  . Cholelithiasis   . Chronic knee pain   . Gallstone pancreatitis   . GERD (gastroesophageal reflux disease)   . Hypertension   . Memory loss     MEDICATIONS:  Prior to Admission medications   Medication Sig Start Date End Date Taking? Authorizing Provider  aspirin EC 81 MG tablet Take 81 mg by mouth daily as needed for mild pain or moderate pain.    [provider]  cetirizine (ZYRTEC) 10 MG tablet Take 1 tablet (10 mg total) by mouth daily. Patient not taking: Reported on 03/29/2017 02/22/17   Hoy Register, MD  hydrALAZINE (APRESOLINE) 50 MG tablet Take 1 tablet (50 mg total) 3 (three) times daily by mouth. 12/27/16   Hoy Register, MD  lactulose (CHRONULAC) 10 GM/15ML solution Take 15 mLs (10 g total) by mouth 3 (three) times daily. Patient not taking: Reported on 09/21/2016 10/18/15   Hoy Register, MD  losartan-hydrochlorothiazide (HYZAAR) 100-25 MG tablet Take 1 tablet by mouth daily. 03/29/17   Hoy Register,  MD  naproxen (NAPROSYN) 500 MG tablet Take 1 tablet (500 mg total) by mouth 2 (two) times daily with a meal. 03/15/17   Hoy Register, MD  traMADol (ULTRAM) 50 MG tablet Take 1 tablet (50 mg total) by mouth every 8 (eight) hours as needed. 03/29/17   Hoy Register, MD    ALLERGIES:  Allergies  Allergen Reactions  . Feosol [Iron] Itching    SOCIAL HISTORY:  Social History   Tobacco Use  . Smoking status: Never Smoker  . Smokeless tobacco: Never Used  Substance Use Topics  . Alcohol use: No    FAMILY HISTORY: Family History  Problem Relation Age of Onset  . Diabetes Father     EXAM: BP (!) 161/74   Pulse 77   Temp 98.3 F (36.8 C) (Oral)   Resp 14   SpO2 100%  CONSTITUTIONAL: Alert and oriented and responds appropriately to questions. Well-appearing; well-nourished HEAD: Normocephalic EYES: Conjunctivae clear, pupils appear equal, EOMI ENT: normal nose; moist mucous membranes NECK: Supple, no meningismus, no nuchal rigidity, no LAD  CARD: RRR; S1 and S2 appreciated; no murmurs, no clicks, no rubs, no gallops RESP: Normal chest excursion without splinting or tachypnea; breath sounds clear and equal bilaterally; no wheezes, no rhonchi, no rales, no hypoxia or respiratory distress, speaking full sentences ABD/GI: Normal bowel sounds; non-distended; soft, non-tender, no rebound, no guarding, no peritoneal signs, no hepatosplenomegaly BACK:  The back  appears normal and is non-tender to palpation, there is no CVA tenderness EXT: Normal ROM in all joints; non-tender to palpation; no edema; normal capillary refill; no cyanosis, no calf tenderness or swelling, compartments are soft, no joint effusion, no redness or warmth, no bony deformity, 2+ radial and DP pulses bilaterally    SKIN: Normal color for age and race; warm; no rash NEURO: Moves all extremities equally, normal sensation diffusely PSYCH: The patient's mood and manner are appropriate. Grooming and personal hygiene are  appropriate.  MEDICAL DECISION MAKING: Patient here with myalgias.  She feels very warm to touch and has a temperature of 99.5.  I suspect patient may have viral illness.  Labs unremarkable other than some mild anemia.  She denies bloody stools or melena.  No vaginal bleeding.  No sign of volume overload on exam.  Troponin, BNP normal.  EKG shows no ischemic abnormality.  Chest x-ray clear.  Urine shows no sign of infection.  Recommended alternating Tylenol and ibuprofen for pain.  I do not feel she needs further emergent workup.  I do not appreciate any peripheral edema on exam.  No sign of compartment syndrome, gout, cellulitis, septic arthritis.  Neurovascular intact distally.  No sign of any injury.  At this time, I do not feel there is any life-threatening condition present. I have reviewed and discussed all results (EKG, imaging, lab, urine as appropriate) and exam findings with patient/family. I have reviewed nursing notes and appropriate previous records.  I feel the patient is safe to be discharged home without further emergent workup and can continue workup as an outpatient as needed. Discussed usual and customary return precautions. Patient/family verbalize understanding and are comfortable with this plan.  Outpatient follow-up has been provided if needed. All questions have been answered.     EKG Interpretation  Date/Time:  Sunday April 21 2017 02:20:49 EST Ventricular Rate:  76 PR Interval:    QRS Duration: 93 QT Interval:  377 QTC Calculation: 424 R Axis:   19 Text Interpretation:  Sinus rhythm Low voltage, precordial leads Poor quality EKG Needs repeat Do not charge patient for this EKG Confirmed by Ward, Baxter HireKristen [phone]) on 04/21/2017 2:49:09 AM         Ward, Layla MawKristen N, DO 04/21/17 [id number]

## 2017-04-23 ENCOUNTER — Ambulatory Visit (INDEPENDENT_AMBULATORY_CARE_PROVIDER_SITE_OTHER): Payer: Medicaid Other | Admitting: Orthopaedic Surgery

## 2017-04-23 ENCOUNTER — Encounter (INDEPENDENT_AMBULATORY_CARE_PROVIDER_SITE_OTHER): Payer: Self-pay | Admitting: Orthopaedic Surgery

## 2017-04-23 DIAGNOSIS — G8929 Other chronic pain: Secondary | ICD-10-CM | POA: Diagnosis not present

## 2017-04-23 DIAGNOSIS — M25561 Pain in right knee: Secondary | ICD-10-CM | POA: Diagnosis not present

## 2017-04-23 DIAGNOSIS — M25531 Pain in right wrist: Secondary | ICD-10-CM

## 2017-04-23 DIAGNOSIS — M25532 Pain in left wrist: Secondary | ICD-10-CM

## 2017-04-23 DIAGNOSIS — M25562 Pain in left knee: Secondary | ICD-10-CM | POA: Diagnosis not present

## 2017-04-23 MED ORDER — GABAPENTIN 100 MG PO CAPS: 100.0000 mg | ORAL_CAPSULE | Freq: Two times a day (BID) | ORAL | 1 refills | Status: DC

## 2017-04-23 MED ORDER — METHYLPREDNISOLONE 4 MG PO TABS: ORAL_TABLET | ORAL | 0 refills | Status: DC

## 2017-04-23 NOTE — Progress Notes (Signed)
Office Visit Note   Patient: Michelle Boyd           Date of Birth: [birth year]           MRN: [id number] Visit Date: 04/23/2017              Requested by: Hoy Register, MD [address] [place], [place] [address] PCP: Hoy Register, MD   Assessment & Plan: Visit Diagnoses:  1. Chronic pain of right knee   2. Chronic pain of left knee   3. Bilateral wrist pain     Plan: I agree with trying steroid injections in both her knees.  She tolerated these well.  I will send in a steroid taper as well as Neurontin 100 mg twice a day and see if this will help with her symptoms.  I have encouraged [facility] the rash on her hands.  I will see her back myself in a month to see how she is doing overall.  All questions concerns were answered and addressed.  Follow-Up Instructions: Return in about 4 weeks (around 05/21/2017).   Orders:  Orders Placed This Encounter  Procedures  . Large Joint Inj   Meds ordered this encounter  Medications  . methylPREDNISolone (MEDROL) 4 MG tablet    Sig: Medrol dose pack. Take as instructed    Dispense:  21 tablet    Refill:  0  . gabapentin (NEURONTIN) 100 MG capsule    Sig: Take 1 capsule (100 mg total) by mouth 2 (two) times daily.    Dispense:  60 capsule    Refill:  1      Procedures: Large Joint Inj: bilateral knee on 04/23/2017 9:48 AM Indications: diagnostic evaluation and pain Details: 22 G 1.5 in needle, superolateral approach  Arthrogram: No  Outcome: tolerated well, no immediate complications Procedure, treatment alternatives, risks and benefits explained, specific risks discussed. Consent was given by the patient. Immediately prior to procedure a time out was called to verify the correct patient, procedure, equipment, support staff and site/side marked as required. Patient was prepped and draped in the usual sterile fashion.       Clinical Data: No additional  findings.   Subjective: Chief Complaint  Patient presents with  . Left Knee - Pain  . Right Knee - Pain  The patient is a very pleasant non-English-speaking female who is with an interpreter today.  Her son is with her as well.  She is referred from [facility] to evaluate bilateral knee pain but also reports bilateral hand and wrist pain.  She said she has had injections in her knees in the past that it helps.  Says her pain is  present all the time and both of her knees and this is been going on for about a week.  She would like to have injections today.  She is not a diabetic but does have high blood pressure.  She also points to a rash on both of her hands she is very slow to mobilize.  She embolus with a cane..  She is not a diabetic.  HPI  Review of Systems She currently denies any headache, chest pain, shortness of breath, fever, chills, nausea, vomiting.  Objective: Vital Signs: There were no vitals taken for this visit.  Physical Exam She is alert and oriented and follows commands appropriately.  She ambulates slowly with a cane. Ortho Exam Examination of both knees  show painful range of motion but are both ligaments is stable.  The right knee has a slight effusion in the left knee does not.  Both knees are globally tender.  Both hands are painful in terms of the wrist all of the fingers.  She has a odd rash on both her palms. Specialty Comments:  No specialty comments available.  Imaging: No results found.  I did independently review x-rays that are only of her right knee which only showed mild arthritic changes no acute findings.  These are on the canopy system. PMFS History: Patient Active Problem List   Diagnosis Date Noted  . Chronic pain of left knee 04/23/2017  . Chronic pain of right knee 04/23/2017  . Osteoarthritis 09/21/2016  . Memory loss 01/11/2016  . Dissection of carotid artery (HCC)   . Right sided weakness   . Hemiplegic migraine  without status migrainosus, not intractable   . Right facial numbness   . Right arm numbness   . Right leg numbness   . Systolic murmur   . Dizziness   . Transient cerebral ischemia 12/03/2015  . Bilateral chronic knee pain 11/11/2014  . AKI (acute kidney injury) (HCC) 10/22/2013  . Sepsis secondary to UTI (HCC) 10/22/2013  . Acute renal failure (HCC) 10/22/2013  . Pyelonephritis, acute 10/22/2013  . Iron (Fe) deficiency anemia 10/22/2013  . Elevated LFTs 10/22/2013  . Microcytic anemia 10/05/2013  . Cholecystitis, acute 10/04/2013  . Other pancytopenia (HCC) 10/03/2013  . Gallstone pancreatitis 10/02/2013  . Gallstones 09/29/2013  . Non-traumatic compression fracture of L3 & L4 lumbar vertebra 08/24/2013  . Right knee pain 11/07/2012  . Hypertension 05/26/2012  . Hypokalemia 05/26/2012   Past Medical History:  Diagnosis Date  . Cholelithiasis   . Chronic knee pain   . Gallstone pancreatitis   . GERD (gastroesophageal reflux disease)   . Hypertension   . Memory loss     Family History  Problem Relation Age of Onset  . Diabetes Father     Past Surgical History:  Procedure Laterality Date  . CATARACT EXTRACTION  2016  . CHOLECYSTECTOMY N/A 10/04/2013   Procedure: LAPAROSCOPIC CHOLECYSTECTOMY WITH INTRAOPERATIVE CHOLANGIOGRAM;  Surgeon: Axel FillerArmando Ramirez, MD;  Location: [facility] OR;  Service: General;  Laterality: N/A;  . EXTERNAL EAR SURGERY  years ago   right mastoidectomy   Social History   Occupational History    Comment: na  Tobacco Use  . Smoking status: Never Smoker  . Smokeless tobacco: Never Used  Substance and Sexual Activity  . Alcohol use: No  . Drug use: No  . Sexual activity: No    Birth control/protection: Abstinence

## 2017-05-15 ENCOUNTER — Ambulatory Visit: Payer: Medicaid Other | Attending: Family Medicine | Admitting: Family Medicine

## 2017-05-15 ENCOUNTER — Encounter: Payer: Self-pay | Admitting: Family Medicine

## 2017-05-15 VITALS — BP 109/65 | HR 90 | Temp 97.7°F | Ht 59.0 in | Wt 126.4 lb

## 2017-05-15 DIAGNOSIS — M2550 Pain in unspecified joint: Secondary | ICD-10-CM

## 2017-05-15 DIAGNOSIS — Z8673 Personal history of transient ischemic attack (TIA), and cerebral infarction without residual deficits: Secondary | ICD-10-CM | POA: Diagnosis not present

## 2017-05-15 DIAGNOSIS — Z79891 Long term (current) use of opiate analgesic: Secondary | ICD-10-CM | POA: Diagnosis not present

## 2017-05-15 DIAGNOSIS — D649 Anemia, unspecified: Secondary | ICD-10-CM | POA: Insufficient documentation

## 2017-05-15 DIAGNOSIS — K219 Gastro-esophageal reflux disease without esophagitis: Secondary | ICD-10-CM | POA: Insufficient documentation

## 2017-05-15 DIAGNOSIS — Z7982 Long term (current) use of aspirin: Secondary | ICD-10-CM | POA: Diagnosis not present

## 2017-05-15 DIAGNOSIS — M170 Bilateral primary osteoarthritis of knee: Secondary | ICD-10-CM | POA: Insufficient documentation

## 2017-05-15 DIAGNOSIS — M1711 Unilateral primary osteoarthritis, right knee: Secondary | ICD-10-CM | POA: Insufficient documentation

## 2017-05-15 DIAGNOSIS — Z9049 Acquired absence of other specified parts of digestive tract: Secondary | ICD-10-CM | POA: Insufficient documentation

## 2017-05-15 DIAGNOSIS — I10 Essential (primary) hypertension: Secondary | ICD-10-CM | POA: Diagnosis not present

## 2017-05-15 DIAGNOSIS — Z888 Allergy status to other drugs, medicaments and biological substances status: Secondary | ICD-10-CM | POA: Insufficient documentation

## 2017-05-15 DIAGNOSIS — D6489 Other specified anemias: Secondary | ICD-10-CM | POA: Diagnosis not present

## 2017-05-15 DIAGNOSIS — Z79899 Other long term (current) drug therapy: Secondary | ICD-10-CM | POA: Diagnosis not present

## 2017-05-15 DIAGNOSIS — Z791 Long term (current) use of non-steroidal anti-inflammatories (NSAID): Secondary | ICD-10-CM | POA: Diagnosis not present

## 2017-05-15 DIAGNOSIS — Z0000 Encounter for general adult medical examination without abnormal findings: Secondary | ICD-10-CM

## 2017-05-15 MED ORDER — HYDRALAZINE HCL 50 MG PO TABS: 50.0000 mg | ORAL_TABLET | Freq: Three times a day (TID) | ORAL | 5 refills | Status: AC

## 2017-05-15 MED ORDER — NAPROXEN 500 MG PO TABS: 500.0000 mg | ORAL_TABLET | Freq: Two times a day (BID) | ORAL | 3 refills | Status: DC

## 2017-05-15 MED FILL — NAPROXEN 500 MG TABLET: 500 | 15 days supply | Qty: 30 | Fill #0

## 2017-05-15 NOTE — Progress Notes (Addendum)
Subjective:  Patient ID: Michelle Boyd, female    DOB: 02/13/1951  Age: 67 y.o. MRN: [id number]  CC: Hypertension   HPI Michelle Boyd is a 67 year old female with a history of Hypertension, GERD, TIA osteoarthritis of the knees who presents today accompanied by her son for follow-up visit. On 04/23/17 she received a right cortisone injection by orthopedics, Dr. Magnus IvanBlackman and reports improvement in her right knee symptoms and denies pain at this time however she complains of pain in both shoulders and both elbows which have been described as moderate and denies history of preceding trauma. She has no analgesic for pain but does have gabapentin which she received from her orthopedic. She had an ED visit for  myalgias earlier in the month and workup by means of troponins, BNP, EKG, chest x-ray were unremarkable and symptoms are thought to be secondary to viral illness. Blood pressure is doing well and she tolerates antihypertensive with no complaints of adverse effects.  Past Medical History:  Diagnosis Date  . Cholelithiasis   . Chronic knee pain   . Gallstone pancreatitis   . GERD (gastroesophageal reflux disease)   . Hypertension   . Memory loss     Past Surgical History:  Procedure Laterality Date  . CATARACT EXTRACTION  2016  . CHOLECYSTECTOMY N/A 10/04/2013   Procedure: LAPAROSCOPIC CHOLECYSTECTOMY WITH INTRAOPERATIVE CHOLANGIOGRAM;  Surgeon: Axel FillerArmando Ramirez, MD;  Location: [facility] OR;  Service: General;  Laterality: N/A;  . EXTERNAL EAR SURGERY  years ago   right mastoidectomy    Allergies  Allergen Reactions  . Feosol [Iron] Itching     Outpatient Medications Prior to Visit  Medication Sig Dispense Refill  . aspirin EC 81 MG tablet Take 81 mg by mouth daily as needed for mild pain or moderate pain.    Marland Kitchen. gabapentin (NEURONTIN) 100 MG capsule Take 1 capsule (100 mg total) by mouth 2 (two) times daily. 60 capsule 1  . losartan-hydrochlorothiazide (HYZAAR) 100-25 MG tablet Take 1 tablet  by mouth daily. 30 tablet 6  . traMADol (ULTRAM) 50 MG tablet Take 1 tablet (50 mg total) by mouth every 8 (eight) hours as needed. 30 tablet 0  . hydrALAZINE (APRESOLINE) 50 MG tablet Take 1 tablet (50 mg total) 3 (three) times daily by mouth. 90 tablet 5  . naproxen (NAPROSYN) 500 MG tablet Take 1 tablet (500 mg total) by mouth 2 (two) times daily with a meal. 30 tablet 1  . cetirizine (ZYRTEC) 10 MG tablet Take 1 tablet (10 mg total) by mouth daily. (Patient not taking: Reported on 03/29/2017) 30 tablet 0  . lactulose (CHRONULAC) 10 GM/15ML solution Take 15 mLs (10 g total) by mouth 3 (three) times daily. (Patient not taking: Reported on 09/21/2016) 946 mL 1  . methylPREDNISolone (MEDROL) 4 MG tablet Medrol dose pack. Take as instructed (Patient not taking: Reported on 05/15/2017) 21 tablet 0   No facility-administered medications prior to visit.     ROS Review of Systems  Constitutional: Negative for activity change, appetite change and fatigue.  HENT: Negative for congestion, sinus pressure and sore throat.   Eyes: Negative for visual disturbance.  Respiratory: Negative for cough, chest tightness, shortness of breath and wheezing.   Cardiovascular: Negative for chest pain and palpitations.  Gastrointestinal: Negative for abdominal distention, abdominal pain and constipation.  Endocrine: Negative for polydipsia.  Genitourinary: Negative for dysuria and frequency.  Musculoskeletal:       See hpi  Skin: Negative for rash.  Neurological: Negative  for tremors, light-headedness and numbness.  Hematological: Does not bruise/bleed easily.  Psychiatric/Behavioral: Negative for agitation and behavioral problems.    Objective:  BP 109/65   Pulse 90   Temp 97.7 F (36.5 C) (Oral)   Ht 4\' 11"  (1.499 m)   Wt 126 lb 6.4 oz (57.3 kg)   SpO2 99%   BMI 25.53 kg/m   BP/Weight 05/15/2017 04/21/2017 03/29/2017  Systolic BP 109 153 191  Diastolic BP 65 56 78  Wt. (Lbs) 126.4 - 131.4  BMI 25.53 -  26.54      Physical Exam  Constitutional: She is oriented to person, place, and time. She appears well-developed and well-nourished.  Cardiovascular: Normal rate and intact distal pulses.  Murmur (3/6 systolic murmur) heard. Pulmonary/Chest: Effort normal and breath sounds normal. She has no wheezes. She has no rales. She exhibits no tenderness.  Abdominal: Soft. Bowel sounds are normal. She exhibits no distension and no mass. There is no tenderness.  Musculoskeletal: Normal range of motion. She exhibits edema (slight R knee edema, no TTP).  Neurological: She is alert and oriented to person, place, and time.  Skin: Skin is warm and dry.  Psychiatric: She has a normal mood and affect.     Assessment & Plan:   1. Essential hypertension Controlled Low-sodium diet - hydrALAZINE (APRESOLINE) 50 MG tablet; Take 1 tablet (50 mg total) by mouth 3 (three) times daily.  Dispense: 90 tablet; Refill: 5  2. Primary osteoarthritis of right knee Status post right knee cortisone injection - naproxen (NAPROSYN) 500 MG tablet; Take 1 tablet (500 mg total) by mouth 2 (two) times daily with a meal.  Dispense: 30 tablet; Refill: 3  3. Arthralgia, unspecified joint Multiple joint aches in shoulders and elbows could be secondary to arthritis Placed on naproxen  4. Anemia due to other cause, not classified Hemoglobin was 8.8 from 04/20/17 We will repeat - CBC with Differential/Platelet  5. Healthcare maintenance - Ambulatory referral to Gastroenterology - [facility] Digital Screening; Future   Meds ordered this encounter  Medications  . hydrALAZINE (APRESOLINE) 50 MG tablet    Sig: Take 1 tablet (50 mg total) by mouth 3 (three) times daily.    Dispense:  90 tablet    Refill:  5  . naproxen (NAPROSYN) 500 MG tablet    Sig: Take 1 tablet (500 mg total) by mouth 2 (two) times daily with a meal.    Dispense:  30 tablet    Refill:  3    Follow-up: Return in about 3 months (around 08/15/2017) for  Follow-up of chronic medical conditions.   Hoy Register MD

## 2017-05-16 LAB — CBC WITH DIFFERENTIAL/PLATELET
BASOS: 0 %
Basophils Absolute: 0 10*3/uL (ref 0.0–0.2)
EOS (ABSOLUTE): 0.2 10*3/uL (ref 0.0–0.4)
EOS: 4 %
HEMATOCRIT: 28 % — AB (ref 34.0–46.6)
HEMOGLOBIN: 8.5 g/dL — AB (ref 11.1–15.9)
IMMATURE GRANS (ABS): 0 10*3/uL (ref 0.0–0.1)
IMMATURE GRANULOCYTES: 1 %
LYMPHS: 16 %
Lymphocytes Absolute: 0.7 10*3/uL (ref 0.7–3.1)
MCH: 22 pg — ABNORMAL LOW (ref 26.6–33.0)
MCHC: 30.4 g/dL — ABNORMAL LOW (ref 31.5–35.7)
MCV: 73 fL — AB (ref 79–97)
MONOCYTES: 4 %
Monocytes Absolute: 0.2 10*3/uL (ref 0.1–0.9)
NEUTROS PCT: 75 %
Neutrophils Absolute: 3.3 10*3/uL (ref 1.4–7.0)
PLATELETS: 292 10*3/uL (ref 150–379)
RBC: 3.86 x10E6/uL (ref 3.77–5.28)
RDW: 17.2 % — ABNORMAL HIGH (ref 12.3–15.4)
WBC: 4.4 10*3/uL (ref 3.4–10.8)

## 2017-05-17 ENCOUNTER — Other Ambulatory Visit: Payer: Self-pay | Admitting: Family Medicine

## 2017-05-17 DIAGNOSIS — D508 Other iron deficiency anemias: Secondary | ICD-10-CM

## 2017-05-21 ENCOUNTER — Ambulatory Visit (INDEPENDENT_AMBULATORY_CARE_PROVIDER_SITE_OTHER): Payer: Medicaid Other | Admitting: Orthopaedic Surgery

## 2017-05-21 ENCOUNTER — Encounter (INDEPENDENT_AMBULATORY_CARE_PROVIDER_SITE_OTHER): Payer: Self-pay | Admitting: Orthopaedic Surgery

## 2017-05-21 DIAGNOSIS — M654 Radial styloid tenosynovitis [de Quervain]: Secondary | ICD-10-CM | POA: Diagnosis not present

## 2017-05-21 DIAGNOSIS — M1711 Unilateral primary osteoarthritis, right knee: Secondary | ICD-10-CM

## 2017-05-21 MED ORDER — METHYLPREDNISOLONE ACETATE 40 MG/ML IJ SUSP
40.0000 mg | INTRAMUSCULAR | Status: AC | PRN
  Administered 2017-05-21: 40 mg

## 2017-05-21 MED ORDER — NAPROXEN 500 MG PO TABS: 500.0000 mg | ORAL_TABLET | Freq: Two times a day (BID) | ORAL | 3 refills | Status: DC

## 2017-05-21 MED ORDER — LIDOCAINE HCL 1 % IJ SOLN
1.0000 mL | INTRAMUSCULAR | Status: AC | PRN
  Administered 2017-05-21: 1 mL

## 2017-05-21 NOTE — Progress Notes (Signed)
   Procedure Note  Patient: Michelle Boyd             Date of Birth: 03/17/1950           MRN: [id number]             Visit Date: 05/21/2017   HPI:  Mrs.  Alleen BorneGurung returns today for follow-up of bilateral knee status post injection she states that she is having no knee pain at this point time.  Her son is present with her today and they both state that her knee pain is much better status post injections 2 weeks ago.  She still having pain in both wrists right greater than left.  She has had no injury to either hand.  No numbness or tingling in either hand.  Son interprets for her today.  Physical exam: General well-developed well-nourished female no acute distress. Bilateral upper extremities: She has good range of motion bilateral elbows.  Full supination pronation bilateral forearms.  Radial pulses are intact bilaterally.  Sensation grossly intact bilateral hands full motor bilateral hands.  She has a positive Finkelstein's bilaterally and tenderness over the first extensor compartment bilaterally.  Negative grind test bilaterally.  No rash skin lesions ulcerations bilateral hands.  Procedures: Visit Diagnoses: Tommi Rumpse Quervain's disease (tenosynovitis)  Primary osteoarthritis of right knee - Plan: naproxen (NAPROSYN) 500 MG tablet  Hand/UE Inj: R extensor compartment 1 for de Quervain's tenosynovitis on 05/21/2017 8:52 AM Medications: 1 mL lidocaine 1 %; 40 mg methylPREDNISolone acetate 40 MG/ML Consent was given by the patient. Patient was prepped and draped in the usual sterile fashion.     Plan: We will place her on naproxen as she is been on this in the past and tolerated it well.  No other NSAIDs while on the naproxen and this is discussed with her son.  We will see her back in 4 weeks to see how well she is doing in regards to the de Quervain's bilaterally and also bilateral knees.  Questions encouraged and answered at length today.

## 2017-05-22 ENCOUNTER — Other Ambulatory Visit (INDEPENDENT_AMBULATORY_CARE_PROVIDER_SITE_OTHER): Payer: Self-pay | Admitting: Orthopaedic Surgery

## 2017-05-22 ENCOUNTER — Telehealth: Payer: Self-pay

## 2017-05-22 NOTE — Telephone Encounter (Signed)
Patient was called and informed of lab results via voicemail. 

## 2017-05-22 NOTE — Telephone Encounter (Signed)
Please advise 

## 2017-06-19 ENCOUNTER — Ambulatory Visit (INDEPENDENT_AMBULATORY_CARE_PROVIDER_SITE_OTHER): Payer: Medicaid Other | Admitting: Orthopaedic Surgery

## 2017-06-19 ENCOUNTER — Encounter (INDEPENDENT_AMBULATORY_CARE_PROVIDER_SITE_OTHER): Payer: Self-pay | Admitting: Orthopaedic Surgery

## 2017-06-19 DIAGNOSIS — M25562 Pain in left knee: Secondary | ICD-10-CM

## 2017-06-19 DIAGNOSIS — M25561 Pain in right knee: Secondary | ICD-10-CM

## 2017-06-19 DIAGNOSIS — G8929 Other chronic pain: Secondary | ICD-10-CM | POA: Diagnosis not present

## 2017-06-19 DIAGNOSIS — M1711 Unilateral primary osteoarthritis, right knee: Secondary | ICD-10-CM

## 2017-06-19 DIAGNOSIS — M170 Bilateral primary osteoarthritis of knee: Secondary | ICD-10-CM | POA: Diagnosis not present

## 2017-06-19 DIAGNOSIS — M654 Radial styloid tenosynovitis [de Quervain]: Secondary | ICD-10-CM

## 2017-06-19 MED ORDER — TRAMADOL HCL 50 MG PO TABS: 50.0000 mg | ORAL_TABLET | Freq: Three times a day (TID) | ORAL | 0 refills | Status: DC | PRN

## 2017-06-19 MED ORDER — NAPROXEN 500 MG PO TABS: 500.0000 mg | ORAL_TABLET | Freq: Two times a day (BID) | ORAL | 3 refills | Status: DC | PRN

## 2017-06-19 MED ORDER — GABAPENTIN 100 MG PO CAPS: 100.0000 mg | ORAL_CAPSULE | Freq: Two times a day (BID) | ORAL | 3 refills | Status: AC

## 2017-06-19 MED ORDER — METHYLPREDNISOLONE 4 MG PO TABS: ORAL_TABLET | ORAL | 0 refills | Status: DC

## 2017-06-19 NOTE — Progress Notes (Signed)
Patient is a very pleasant 67 year old non-English-speaking female who continues to follow-up with [place] with multiple muscular skeletal complaints of pain.  Was seen her for her knees and her wrist now she has pain in her neck and shoulders.  She has been on Neurontin in the past as well as tramadol and naproxen.  She is not on any of those right now.  She habits with a cane.  Family member is with her as well as an interpreter.  On exam today's pain seems to be more upper extremity in her shoulders and neck and she has a lot of guarding with this.  Her bilateral knee valgus exam is stable.  She tolerated injections and laser as well.  At this point from an orthopedic surgery standpoint I have really nothing else to offer.  She would definitely benefit from continued anti-inflammatories and [facility] seeing if they can set her up for physical therapy not just to help with her overall conditioning and mobility.  All questions concerns were answered and addressed.  She will follow-up as needed otherwise.

## 2017-07-19 ENCOUNTER — Encounter (HOSPITAL_COMMUNITY): Payer: Self-pay

## 2017-07-19 ENCOUNTER — Inpatient Hospital Stay (HOSPITAL_COMMUNITY)
Admission: EM | Admit: 2017-07-19 | Discharge: 2017-07-23 | DRG: 556 | Disposition: A | Discharge status: Home / Self Care | Payer: Medicaid Other | Attending: Internal Medicine | Admitting: Internal Medicine

## 2017-07-19 ENCOUNTER — Other Ambulatory Visit: Payer: Self-pay

## 2017-07-19 DIAGNOSIS — M25432 Effusion, left wrist: Secondary | ICD-10-CM

## 2017-07-19 DIAGNOSIS — I10 Essential (primary) hypertension: Secondary | ICD-10-CM | POA: Diagnosis present

## 2017-07-19 DIAGNOSIS — D649 Anemia, unspecified: Secondary | ICD-10-CM

## 2017-07-19 DIAGNOSIS — R509 Fever, unspecified: Secondary | ICD-10-CM

## 2017-07-19 DIAGNOSIS — E538 Deficiency of other specified B group vitamins: Secondary | ICD-10-CM | POA: Diagnosis present

## 2017-07-19 DIAGNOSIS — Z888 Allergy status to other drugs, medicaments and biological substances status: Secondary | ICD-10-CM

## 2017-07-19 DIAGNOSIS — M25532 Pain in left wrist: Secondary | ICD-10-CM

## 2017-07-19 DIAGNOSIS — Z8673 Personal history of transient ischemic attack (TIA), and cerebral infarction without residual deficits: Secondary | ICD-10-CM

## 2017-07-19 DIAGNOSIS — D509 Iron deficiency anemia, unspecified: Secondary | ICD-10-CM | POA: Diagnosis present

## 2017-07-19 DIAGNOSIS — D638 Anemia in other chronic diseases classified elsewhere: Secondary | ICD-10-CM | POA: Diagnosis present

## 2017-07-19 DIAGNOSIS — Z9841 Cataract extraction status, right eye: Secondary | ICD-10-CM

## 2017-07-19 DIAGNOSIS — Z833 Family history of diabetes mellitus: Secondary | ICD-10-CM

## 2017-07-19 DIAGNOSIS — R6510 Systemic inflammatory response syndrome (SIRS) of non-infectious origin without acute organ dysfunction: Secondary | ICD-10-CM | POA: Diagnosis present

## 2017-07-19 DIAGNOSIS — Z9842 Cataract extraction status, left eye: Secondary | ICD-10-CM

## 2017-07-19 DIAGNOSIS — M1711 Unilateral primary osteoarthritis, right knee: Secondary | ICD-10-CM | POA: Diagnosis present

## 2017-07-19 DIAGNOSIS — M2550 Pain in unspecified joint: Principal | ICD-10-CM | POA: Diagnosis present

## 2017-07-19 DIAGNOSIS — Z7982 Long term (current) use of aspirin: Secondary | ICD-10-CM

## 2017-07-19 DIAGNOSIS — Z9049 Acquired absence of other specified parts of digestive tract: Secondary | ICD-10-CM

## 2017-07-19 DIAGNOSIS — K219 Gastro-esophageal reflux disease without esophagitis: Secondary | ICD-10-CM | POA: Diagnosis present

## 2017-07-19 HISTORY — DX: Unspecified osteoarthritis, unspecified site: M19.90

## 2017-07-19 MED ORDER — TRAMADOL HCL 50 MG PO TABS
50.0000 mg | ORAL_TABLET | Freq: Once | ORAL | Status: AC
  Administered 2017-07-20: 50 mg via ORAL
  Filled 2017-07-19: qty 1

## 2017-07-19 NOTE — ED Provider Notes (Signed)
[facility] Provider Note   CSN: [id number] Arrival date & time: 07/19/17  1151     History   Chief Complaint No chief complaint on file.   HPI Michelle Boyd is a 67 y.o. female.  Patient with a history of HTN, GERD, arthritis presents with myalgia and arthralgia for the past one week. No known fever. She reports swelling of hands and feet, greater on the right. She has had similar symptoms in the past but it is usually isolated to pain in her joints, especially hands, wrists and feet. No nausea, vomiting, SOB, CP. She endorses pain with urination. No change in eating or drinking. No falls or significant weakness. No headache.  The history is provided by the patient. No language interpreter was used.    Past Medical History:  Diagnosis Date  . Arthritis   . Cholelithiasis   . Chronic knee pain   . Gallstone pancreatitis   . GERD (gastroesophageal reflux disease)   . Hypertension   . Memory loss     Patient Active Problem List   Diagnosis Date Noted  . De Quervain's disease (tenosynovitis) 06/19/2017  . Chronic pain of left knee 04/23/2017  . Chronic pain of right knee 04/23/2017  . Osteoarthritis 09/21/2016  . Memory loss 01/11/2016  . Dissection of carotid artery (HCC)   . Right sided weakness   . Hemiplegic migraine without status migrainosus, not intractable   . Right facial numbness   . Right arm numbness   . Right leg numbness   . Systolic murmur   . Dizziness   . Transient cerebral ischemia 12/03/2015  . Bilateral chronic knee pain 11/11/2014  . AKI (acute kidney injury) (HCC) 10/22/2013  . Sepsis secondary to UTI (HCC) 10/22/2013  . Acute renal failure (HCC) 10/22/2013  . Pyelonephritis, acute 10/22/2013  . Iron (Fe) deficiency anemia 10/22/2013  . Elevated LFTs 10/22/2013  . Microcytic anemia 10/05/2013  . Cholecystitis, acute 10/04/2013  . Other pancytopenia (HCC) 10/03/2013  . Gallstone pancreatitis 10/02/2013    . Gallstones 09/29/2013  . Non-traumatic compression fracture of L3 & L4 lumbar vertebra 08/24/2013  . Right knee pain 11/07/2012  . Hypertension 05/26/2012  . Hypokalemia 05/26/2012    Past Surgical History:  Procedure Laterality Date  . CATARACT EXTRACTION  2016  . CHOLECYSTECTOMY N/A 10/04/2013   Procedure: LAPAROSCOPIC CHOLECYSTECTOMY WITH INTRAOPERATIVE CHOLANGIOGRAM;  Surgeon: Axel FillerArmando Ramirez, MD;  Location: [facility] OR;  Service: General;  Laterality: N/A;  . EXTERNAL EAR SURGERY  years ago   right mastoidectomy     OB History   None      Home Medications    Prior to Admission medications   Medication Sig Start Date End Date Taking? Authorizing Provider  aspirin EC 81 MG tablet Take 81 mg by mouth daily as needed for mild pain or moderate pain.    [provider]  cetirizine (ZYRTEC) 10 MG tablet Take 1 tablet (10 mg total) by mouth daily. Patient not taking: Reported on 03/29/2017 02/22/17   Hoy RegisterNewlin, Enobong, MD  gabapentin (NEURONTIN) 100 MG capsule Take 1 capsule (100 mg total) by mouth 2 (two) times daily. 06/19/17   Kathryne HitchBlackman, Christopher Y, MD  hydrALAZINE (APRESOLINE) 50 MG tablet Take 1 tablet (50 mg total) by mouth 3 (three) times daily. 05/15/17   Hoy RegisterNewlin, Enobong, MD  lactulose (CHRONULAC) 10 GM/15ML solution Take 15 mLs (10 g total) by mouth 3 (three) times daily. Patient not taking: Reported on 09/21/2016 10/18/15   Newlin,  Enobong, MD  losartan-hydrochlorothiazide (HYZAAR) 100-25 MG tablet Take 1 tablet by mouth daily. 03/29/17   Hoy Register, MD  methylPREDNISolone (MEDROL) 4 MG tablet Medrol dose pack. Take as instructed 06/19/17   Kathryne Hitch, MD  naproxen (NAPROSYN) 500 MG tablet Take 1 tablet (500 mg total) by mouth 2 (two) times daily between meals as needed. 06/19/17   Kathryne Hitch, MD  traMADol (ULTRAM) 50 MG tablet Take 1 tablet (50 mg total) by mouth 3 (three) times daily as needed. 06/19/17   Kathryne Hitch, MD    Family  History Family History  Problem Relation Age of Onset  . Diabetes Father     Social History Social History   Tobacco Use  . Smoking status: Never Smoker  . Smokeless tobacco: Never Used  Substance Use Topics  . Alcohol use: No  . Drug use: No     Allergies   Feosol [iron]   Review of Systems Review of Systems  Constitutional: Negative for chills and fever.  HENT: Negative.   Respiratory: Negative.  Negative for cough and shortness of breath.   Cardiovascular: Negative.  Negative for chest pain.  Gastrointestinal: Negative.  Negative for abdominal pain and nausea.  Genitourinary: Positive for dysuria.  Musculoskeletal: Positive for arthralgias and myalgias. Negative for neck stiffness.  Skin: Negative.   Neurological: Negative.      Physical Exam Updated Vital Signs BP (!) 155/67   Pulse 88   Temp 98 F (36.7 C) (Oral)   Resp 14   SpO2 100%   Physical Exam  Constitutional: She is oriented to person, place, and time. She appears well-developed and well-nourished.  HENT:  Head: Normocephalic.  Eyes: Conjunctivae are normal.  Neck: Normal range of motion. Neck supple.  Cardiovascular: Normal rate, regular rhythm and intact distal pulses.  Pulmonary/Chest: Effort normal and breath sounds normal. She has no wheezes. She has no rales. She exhibits no tenderness.  Abdominal: Soft. Bowel sounds are normal. There is no tenderness. There is no rebound and no guarding.  Musculoskeletal: Normal range of motion.  Right wrist, and hand swelling. Feels warm to tough. No erythema. Right foot swollen, also without redness. Also warm to touch. FROM all joints.   Neurological: She is alert and oriented to person, place, and time.  Skin: Skin is warm and dry. No rash noted.  Psychiatric: She has a normal mood and affect.     ED Treatments / Results  Labs (all labs ordered are listed, but only abnormal results are displayed) Labs Reviewed - No data to  display  EKG None  Radiology No results found.  Procedures Procedures (including critical care time)  Medications Ordered in ED Medications - No data to display   Initial Impression / Assessment and Plan / ED Course  I have reviewed the triage vital signs and the nursing notes.  Pertinent labs & imaging results that were available during my care of the patient were reviewed by me and considered in my medical decision making (see chart for details).     Patient presents with myalgias that are generalized and joint pain in R>L wrist, hands and ankles. Right ankle and wrist are moderately swollen and warm. She reports that joint pain is not new but the swelling is, as well as the myalgia.   The patient has a history of anemia. Tonight it is 7.9, down from 8.5 2 months ago. Review of chart shows a steady down trending. She has a history of pancytopenia.  Tonight her WBC is 2.3 (L), platelets 185. During her visit she develops a fever of 100.4 R. She is found to have hematuria without evidence of infection.   Unknown source of painful swollen warm joints. Elevated CRP of 4.4. Normal lactate. Feel she will need further evaluation prior to discharge home. Discussed admission with Dr. Clyde Lundborg who accepts the patient onto his service. Appreciate his help with this patient.  Final Clinical Impressions(s) / ED Diagnoses   Final diagnoses:  None   1. Febrile illness 2. Arthralgia 3. Myalgia 4. Anemia  ED Discharge Orders    None       Elpidio Anis, PA-C 07/20/17 0700    Cathren Laine, MD 07/20/17 1538

## 2017-07-19 NOTE — ED Triage Notes (Signed)
Patient complains of recurrent hand and feet swelling and pain x 1 week that family reports is related to arthritis, no redness , no warmth, notable swelling to right hand

## 2017-07-20 ENCOUNTER — Other Ambulatory Visit: Payer: Self-pay

## 2017-07-20 DIAGNOSIS — M2550 Pain in unspecified joint: Secondary | ICD-10-CM | POA: Diagnosis not present

## 2017-07-20 DIAGNOSIS — D509 Iron deficiency anemia, unspecified: Secondary | ICD-10-CM | POA: Diagnosis not present

## 2017-07-20 DIAGNOSIS — Z9841 Cataract extraction status, right eye: Secondary | ICD-10-CM | POA: Diagnosis not present

## 2017-07-20 DIAGNOSIS — R6510 Systemic inflammatory response syndrome (SIRS) of non-infectious origin without acute organ dysfunction: Secondary | ICD-10-CM | POA: Diagnosis not present

## 2017-07-20 DIAGNOSIS — Z8673 Personal history of transient ischemic attack (TIA), and cerebral infarction without residual deficits: Secondary | ICD-10-CM | POA: Diagnosis not present

## 2017-07-20 DIAGNOSIS — D649 Anemia, unspecified: Secondary | ICD-10-CM

## 2017-07-20 DIAGNOSIS — M1711 Unilateral primary osteoarthritis, right knee: Secondary | ICD-10-CM | POA: Diagnosis present

## 2017-07-20 DIAGNOSIS — Z9842 Cataract extraction status, left eye: Secondary | ICD-10-CM | POA: Diagnosis not present

## 2017-07-20 DIAGNOSIS — Z833 Family history of diabetes mellitus: Secondary | ICD-10-CM | POA: Diagnosis not present

## 2017-07-20 DIAGNOSIS — D638 Anemia in other chronic diseases classified elsewhere: Secondary | ICD-10-CM | POA: Diagnosis present

## 2017-07-20 DIAGNOSIS — E538 Deficiency of other specified B group vitamins: Secondary | ICD-10-CM | POA: Diagnosis present

## 2017-07-20 DIAGNOSIS — Z7982 Long term (current) use of aspirin: Secondary | ICD-10-CM | POA: Diagnosis not present

## 2017-07-20 DIAGNOSIS — I10 Essential (primary) hypertension: Secondary | ICD-10-CM | POA: Diagnosis present

## 2017-07-20 DIAGNOSIS — K219 Gastro-esophageal reflux disease without esophagitis: Secondary | ICD-10-CM | POA: Diagnosis present

## 2017-07-20 DIAGNOSIS — Z888 Allergy status to other drugs, medicaments and biological substances status: Secondary | ICD-10-CM | POA: Diagnosis not present

## 2017-07-20 DIAGNOSIS — Z9049 Acquired absence of other specified parts of digestive tract: Secondary | ICD-10-CM | POA: Diagnosis not present

## 2017-07-20 LAB — CBC
HEMATOCRIT: 27.4 % — AB (ref 36.0–46.0)
Hemoglobin: 8.4 g/dL — ABNORMAL LOW (ref 12.0–15.0)
MCH: 22.1 pg — ABNORMAL LOW (ref 26.0–34.0)
MCHC: 30.7 g/dL (ref 30.0–36.0)
MCV: 72.1 fL — ABNORMAL LOW (ref 78.0–100.0)
Platelets: 191 10*3/uL (ref 150–400)
RBC: 3.8 MIL/uL — AB (ref 3.87–5.11)
RDW: 18.3 % — ABNORMAL HIGH (ref 11.5–15.5)
WBC: 2.7 10*3/uL — ABNORMAL LOW (ref 4.0–10.5)

## 2017-07-20 LAB — ABO/RH: ABO/RH(D): A POS

## 2017-07-20 LAB — URINALYSIS, ROUTINE W REFLEX MICROSCOPIC
Bilirubin Urine: NEGATIVE
GLUCOSE, UA: NEGATIVE mg/dL
Ketones, ur: NEGATIVE mg/dL
Leukocytes, UA: NEGATIVE
NITRITE: NEGATIVE
PH: 6 (ref 5.0–8.0)
Protein, ur: NEGATIVE mg/dL
Specific Gravity, Urine: 1.01 (ref 1.005–1.030)

## 2017-07-20 LAB — COMPREHENSIVE METABOLIC PANEL
ALBUMIN: 2.6 g/dL — AB (ref 3.5–5.0)
ALK PHOS: 60 U/L (ref 38–126)
ALT: 15 U/L (ref 14–54)
AST: 27 U/L (ref 15–41)
Anion gap: 7 (ref 5–15)
BUN: 9 mg/dL (ref 6–20)
CALCIUM: 7.9 mg/dL — AB (ref 8.9–10.3)
CO2: 22 mmol/L (ref 22–32)
Chloride: 109 mmol/L (ref 101–111)
Creatinine, Ser: 0.69 mg/dL (ref 0.44–1.00)
GFR calc Af Amer: >60 mL/min (ref >60)
GFR calc non Af Amer: >60 mL/min (ref >60)
GLUCOSE: 97 mg/dL (ref 65–99)
Potassium: 3.9 mmol/L (ref 3.5–5.1)
SODIUM: 138 mmol/L (ref 135–145)
Total Bilirubin: 1 mg/dL (ref 0.3–1.2)
Total Protein: 6.3 g/dL — ABNORMAL LOW (ref 6.5–8.1)

## 2017-07-20 LAB — CBC WITH DIFFERENTIAL/PLATELET
BASOS ABS: 0 10*3/uL (ref 0.0–0.1)
Basophils Relative: 0 %
Eosinophils Absolute: 0.1 10*3/uL (ref 0.0–0.7)
Eosinophils Relative: 2 %
HCT: 26.4 % — ABNORMAL LOW (ref 36.0–46.0)
HEMOGLOBIN: 7.9 g/dL — AB (ref 12.0–15.0)
LYMPHS ABS: 0.4 10*3/uL — AB (ref 0.7–4.0)
LYMPHS PCT: 16 %
MCH: 21.9 pg — ABNORMAL LOW (ref 26.0–34.0)
MCHC: 29.9 g/dL — AB (ref 30.0–36.0)
MCV: 73.3 fL — ABNORMAL LOW (ref 78.0–100.0)
MONOS PCT: 4 %
Monocytes Absolute: 0.1 10*3/uL (ref 0.1–1.0)
Neutro Abs: 2.2 10*3/uL (ref 1.7–7.7)
Neutrophils Relative %: 78 %
Platelets: 185 10*3/uL (ref 150–400)
RBC: 3.6 MIL/uL — AB (ref 3.87–5.11)
RDW: 18.4 % — ABNORMAL HIGH (ref 11.5–15.5)
WBC MORPHOLOGY: INCREASED
WBC: 2.8 10*3/uL — AB (ref 4.0–10.5)

## 2017-07-20 LAB — BASIC METABOLIC PANEL
Anion gap: 6 (ref 5–15)
BUN: 9 mg/dL (ref 6–20)
CALCIUM: 7.6 mg/dL — AB (ref 8.9–10.3)
CO2: 21 mmol/L — AB (ref 22–32)
Chloride: 107 mmol/L (ref 101–111)
Creatinine, Ser: 0.67 mg/dL (ref 0.44–1.00)
GFR calc Af Amer: >60 mL/min (ref >60)
Glucose, Bld: 87 mg/dL (ref 65–99)
Potassium: 4 mmol/L (ref 3.5–5.1)
SODIUM: 134 mmol/L — AB (ref 135–145)

## 2017-07-20 LAB — PROCALCITONIN: Procalcitonin: <0.1 ng/mL

## 2017-07-20 LAB — APTT: aPTT: 43 seconds — ABNORMAL HIGH (ref 24–36)

## 2017-07-20 LAB — SEDIMENTATION RATE: Sed Rate: 39 mm/hr — ABNORMAL HIGH (ref 0–22)

## 2017-07-20 LAB — IRON AND TIBC
Iron: 19 ug/dL — ABNORMAL LOW (ref 28–170)
Saturation Ratios: 7 % — ABNORMAL LOW (ref 10.4–31.8)
TIBC: 284 ug/dL (ref 250–450)
UIBC: 265 ug/dL

## 2017-07-20 LAB — URIC ACID: URIC ACID, SERUM: 5.2 mg/dL (ref 2.3–6.6)

## 2017-07-20 LAB — LACTATE DEHYDROGENASE: LDH: 290 U/L — AB (ref 98–192)

## 2017-07-20 LAB — VITAMIN B12: VITAMIN B 12: 170 pg/mL — AB (ref 180–914)

## 2017-07-20 LAB — PROTIME-INR
INR: 1.27
Prothrombin Time: 15.8 seconds — ABNORMAL HIGH (ref 11.4–15.2)

## 2017-07-20 LAB — RETICULOCYTES
RBC.: 3.67 MIL/uL — AB (ref 3.87–5.11)
RETIC CT PCT: 1.1 % (ref 0.4–3.1)
Retic Count, Absolute: 40.4 10*3/uL (ref 19.0–186.0)

## 2017-07-20 LAB — FOLATE: Folate: 8.2 ng/mL (ref >5.9)

## 2017-07-20 LAB — C-REACTIVE PROTEIN: CRP: 4.4 mg/dL — AB (ref <1.0)

## 2017-07-20 LAB — RPR: RPR Ser Ql: NONREACTIVE

## 2017-07-20 LAB — SAVE SMEAR

## 2017-07-20 LAB — FERRITIN: Ferritin: 69 ng/mL (ref 11–307)

## 2017-07-20 LAB — LACTIC ACID, PLASMA
Lactic Acid, Venous: 0.7 mmol/L (ref 0.5–1.9)
Lactic Acid, Venous: 0.6 mmol/L (ref 0.5–1.9)
Lactic Acid, Venous: 0.8 mmol/L (ref 0.5–1.9)

## 2017-07-20 LAB — HIV ANTIBODY (ROUTINE TESTING W REFLEX): HIV Screen 4th Generation wRfx: NONREACTIVE

## 2017-07-20 MED ORDER — OXYCODONE-ACETAMINOPHEN 5-325 MG PO TABS: 1.0000 | ORAL_TABLET | ORAL | Status: DC | PRN

## 2017-07-20 MED ORDER — ONDANSETRON HCL 4 MG PO TABS: 4.0000 mg | ORAL_TABLET | Freq: Four times a day (QID) | ORAL | Status: DC | PRN

## 2017-07-20 MED ORDER — SODIUM CHLORIDE 0.9 % IV SOLN
1.0000 g | INTRAVENOUS | Status: DC
  Administered 2017-07-20: 1 g via INTRAVENOUS
  Filled 2017-07-20: qty 10

## 2017-07-20 MED ORDER — LACTULOSE 10 GM/15ML PO SOLN: 10.0000 g | Freq: Three times a day (TID) | ORAL | Status: DC

## 2017-07-20 MED ORDER — METHYLPREDNISOLONE SODIUM SUCC 40 MG IJ SOLR
40.0000 mg | Freq: Every day | INTRAMUSCULAR | Status: DC
  Administered 2017-07-20 – 2017-07-22 (×3): 40 mg via INTRAVENOUS
  Filled 2017-07-20 (×4): qty 1

## 2017-07-20 MED ORDER — ACETAMINOPHEN 650 MG RE SUPP: 650.0000 mg | Freq: Four times a day (QID) | RECTAL | Status: DC | PRN

## 2017-07-20 MED ORDER — ONDANSETRON HCL 4 MG/2ML IJ SOLN: 4.0000 mg | Freq: Four times a day (QID) | INTRAMUSCULAR | Status: DC | PRN

## 2017-07-20 MED ORDER — HYDRALAZINE HCL 20 MG/ML IJ SOLN: 5.0000 mg | INTRAMUSCULAR | Status: DC | PRN

## 2017-07-20 MED ORDER — PANTOPRAZOLE SODIUM 40 MG PO TBEC
40.0000 mg | DELAYED_RELEASE_TABLET | Freq: Every day | ORAL | Status: DC | PRN
  Filled 2017-07-20: qty 1

## 2017-07-20 MED ORDER — ZOLPIDEM TARTRATE 5 MG PO TABS: 5.0000 mg | ORAL_TABLET | Freq: Every evening | ORAL | Status: DC | PRN

## 2017-07-20 MED ORDER — GABAPENTIN 100 MG PO CAPS
100.0000 mg | ORAL_CAPSULE | Freq: Two times a day (BID) | ORAL | Status: DC
  Administered 2017-07-20 – 2017-07-23 (×6): 100 mg via ORAL
  Filled 2017-07-20 (×7): qty 1

## 2017-07-20 MED ORDER — SODIUM CHLORIDE 0.9 % IV SOLN
2.0000 g | INTRAVENOUS | Status: DC
  Administered 2017-07-20 – 2017-07-21 (×2): 2 g via INTRAVENOUS
  Filled 2017-07-20 (×3): qty 20

## 2017-07-20 MED ORDER — SODIUM CHLORIDE 0.9 % IV SOLN
INTRAVENOUS | Status: DC
  Administered 2017-07-20 – 2017-07-22 (×3): via INTRAVENOUS

## 2017-07-20 MED ORDER — ACETAMINOPHEN 325 MG PO TABS
650.0000 mg | ORAL_TABLET | Freq: Four times a day (QID) | ORAL | Status: DC | PRN
  Administered 2017-07-20: 650 mg via ORAL
  Filled 2017-07-20: qty 2

## 2017-07-20 MED ORDER — HEPARIN SODIUM (PORCINE) 5000 UNIT/ML IJ SOLN
5000.0000 [IU] | Freq: Three times a day (TID) | INTRAMUSCULAR | Status: DC
  Administered 2017-07-20 – 2017-07-23 (×10): 5000 [IU] via SUBCUTANEOUS
  Filled 2017-07-20 (×10): qty 1

## 2017-07-20 NOTE — Progress Notes (Addendum)
PROGRESS NOTE  Michelle Boyd JOI:[id number] DOB: 1950-05-24 DOA: 07/19/2017 PCP: Charlott Rakes, MD  HPI/Recap of past 24 hours: Patient is doing much better she is afebrile.  Her pain is improved and the swelling on the left hand is also improved  Assessment/Plan: Principal Problem:   Multiple joint pain Active Problems:   Microcytic anemia   SIRS (systemic inflammatory response syndrome) (HCC)   GERD (gastroesophageal reflux disease)   Multiple joint pain and swelling and SIRS vs sepsis: Etiology is not clear, differential diagnosis include gout, septic joint, gonorrhea and inflammatory arthritis (but patient does not have rash).  Patient meets criteria for Sirs versus sepsis with fever, tachycardia and WBC<4.0.  Lactic acid is normal.   -  Rocephin, follow-up blood culture - Solu-Medrol 40 mg daily - As needed Percocet and Tylenol for pain - IVF: Normal saline 100 cc/h (no NS bolus since lactic acid is normal - check RPR, ESR, CRP, uric acid Rheumatoid factor and ANA  HTN:  Hold blood pressure medication since patient at risk of developing hypotension -IV hydralazine prn  GERD: -Protonix prn  Microcytic anemia: Patient's hemoglobin seems to be slowly trending down, 7.9 on admission.  Patient denies hematochezia or hematuria.  She has history of pancytopenia with unclear etiology.  Today platelets are normal, but a WBC 2.8. -Check FOBT -Anemia panel - LDH, peripheral smear and haptoglobin     DVT ppx: SQ Heparin  Code Status: Full code Family Communication: None at bed side.     Disposition Plan:  Anticipate discharge back to previous home environment Consults called:  none Admission status:    Inpatient   Procedures:  non  Antimicrobials: Rocephin,       Objective: Vitals:   07/20/17 0430 07/20/17 0656 07/20/17 0844 07/20/17 0914  BP: 117/62 111/65 123/68 132/69  Pulse: 90 78 88 78  Resp: _0 Temp:  98.2 F (36.8 C) 98.4 F (36.9 C)  (!) 97.4 F (36.3 C)  TempSrc:  Oral Oral Oral  SpO2: 95% 96% 98%     Intake/Output Summary (Last 24 hours) at 07/20/2017 1042 Last data filed at 07/20/2017 0651 Gross per 24 hour  Intake 100 ml  Output -  Net 100 ml   There were no vitals filed for this visit. There is no height or weight on file to calculate BMI.  Exam:   General: Not in acute distress she is afebrile HEENT:       Eyes: PERRL, EOMI, no scleral icterus.       ENT: No discharge from the ears and nose, no pharynx injection, no tonsillar enlargement.        Neck: No JVD, no bruit, no mass felt. Heme: No neck lymph node enlargement. Cardiac: B5/A3, RRR, 4/6 systolic murmurs, No gallops or rubs. Respiratory: No rales, wheezing, rhonchi or rubs. GI: Soft, nondistended, nontender, no rebound pain, no organomegaly, BS present. GU: No hematuria Ext: No pitting leg edema bilaterally. 2+DP/PT pulse bilaterally. Musculoskeletal: has swelling, warmth and pain in right wrist,  swelling and pain on the left wrist is resolved  skin: No rashes.  Neuro: Alert, oriented X3, cranial nerves II-XII grossly intact, moves all extremities normally. Psych: Patient is not psychotic, no suicidal or hemocidal ideation.    Data Reviewed: CBC: Recent Labs  Lab 07/19/17 2248 07/20/17 0413  WBC 2.8* 2.7*  NEUTROABS 2.2  --   HGB 7.9* 8.4*  HCT 26.4* 27.4*  MCV 73.3* 72.1*  PLT 185 191  Basic Metabolic Panel: Recent Labs  Lab 07/19/17 2248 07/20/17 0413  NA 138 134*  K 3.9 4.0  CL 109 107  CO2 22 21*  GLUCOSE 97 87  BUN 9 9  CREATININE 0.69 0.67  CALCIUM 7.9* 7.6*   GFR: CrCl cannot be calculated (Unknown ideal weight.). Liver Function Tests: Recent Labs  Lab 07/19/17 2248  AST 27  ALT 15  ALKPHOS 60  BILITOT 1.0  PROT 6.3*  ALBUMIN 2.6*   No results for input(s): LIPASE, AMYLASE in the last 168 hours. No results for input(s): AMMONIA in the last 168 hours. Coagulation Profile: Recent Labs  Lab  07/20/17 0413  INR 1.27   Cardiac Enzymes: No results for input(s): CKTOTAL, CKMB, CKMBINDEX, TROPONINI in the last 168 hours. BNP (last 3 results) No results for input(s): PROBNP in the last 8760 hours. HbA1C: No results for input(s): HGBA1C in the last 72 hours. CBG: No results for input(s): GLUCAP in the last 168 hours. Lipid Profile: No results for input(s): CHOL, HDL, LDLCALC, TRIG, CHOLHDL, LDLDIRECT in the last 72 hours. Thyroid Function Tests: No results for input(s): TSH, T4TOTAL, FREET4, T3FREE, THYROIDAB in the last 72 hours. Anemia Panel: No results for input(s): VITAMINB12, FOLATE, FERRITIN, TIBC, IRON, RETICCTPCT in the last 72 hours. Urine analysis:    Component Value Date/Time   COLORURINE YELLOW 07/19/2017 [place] 07/19/2017 2341   LABSPEC 1.010 07/19/2017 2341   PHURINE 6.0 07/19/2017 2341   GLUCOSEU NEGATIVE 07/19/2017 2341   HGBUR MODERATE (A) 07/19/2017 2341   BILIRUBINUR NEGATIVE 07/19/2017 2341   KETONESUR NEGATIVE 07/19/2017 2341   PROTEINUR NEGATIVE 07/19/2017 2341   UROBILINOGEN 1.0 10/22/2013 1100   NITRITE NEGATIVE 07/19/2017 2341   LEUKOCYTESUR NEGATIVE 07/19/2017 2341   Sepsis Labs: _0 (procalcitonin:4,lacticidven:4)  )No results found for this or any previous visit (from the past 240 hour(s)).    Studies: No results found.  Scheduled Meds: . gabapentin  100 mg Oral BID  . heparin  5,000 Units Subcutaneous Q8H  . methylPREDNISolone (SOLU-MEDROL) injection  40 mg Intravenous Daily    Continuous Infusions: . sodium chloride 100 mL/hr at 07/20/17 0433  . cefTRIAXone (ROCEPHIN)  IV Stopped (07/20/17 4473)     LOS: 0 days     Cristal Deer, MD [facility]  To reach me or the doctor on call, go to: [email or web address] Password [facility]  07/20/2017, 10:42 AM

## 2017-07-20 NOTE — ED Notes (Signed)
Lab received urine and are starting to run it per [employer]

## 2017-07-20 NOTE — ED Notes (Signed)
Eating breakfast 

## 2017-07-20 NOTE — ED Notes (Signed)
Pt being transported to 5N05 via w/c. Grandson w/pt.

## 2017-07-20 NOTE — Progress Notes (Addendum)
[id number] Received pt from ED via wheelchair, pt is A&O x4, does not speak English but a family member is present that speaks [place]. Verbalized of feeling better. [employer] translator used for admission profile.

## 2017-07-20 NOTE — ED Notes (Signed)
Attempted to call report to 5N - Diplomatic Services operational officersecretary advised RN requested to return call.

## 2017-07-20 NOTE — H&P (Signed)
History and Physical    Michelle Boyd DOB: [birth year] DOA: 07/19/2017  Referring MD/NP/PA:   PCP: Charlott Rakes, MD   Patient coming from:  The patient is coming from home.  At baseline, pt is independent for most of ADL.   Chief Complaint: Multiple joint pain and swelling, fever  HPI: Michelle Boyd is a 67 y.o. female with medical history significant of knee joint arthritis, hypertension, TIA, GERD, pancytopenia, memory loss, who presents with multiple joint pain, swelling and fever.  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  Patient states that she has been having multiple joint pain and swelling for more than 1 week, which has been progressively getting worse.  She has bilateral wrist, knee and ankle swelling and pain, right joints are worse than the left.  Patient also has fever and chills.  She does not have chest pain, shortness breath, cough.  Denies nausea, vomiting, abdominal pain.  She states that sometimes she has mild diarrhea, but not today.  She denies any hematuria, bloody stool or dark stool.  No unilateral weakness symptoms of UTI. No rashes.  ED Course: pt was found to have WBC 2.8, hemoglobin 7.9 which was 8.5 on 05/08/2017, lactic acid 0.9, urinalysis negative for UTI, electrolytes renal function okay, CRP 4.4, temperature 100.8, tachycardia, no tachypnea, oxygen saturation 94% on room air.  Patient is admitted to [place] bed as inpatient.  Review of Systems:   General: has fevers, chills, no body weight gain, has poor appetite, has fatigue HEENT: no blurry vision, hearing changes or sore throat Respiratory: no dyspnea, coughing, wheezing CV: no chest pain, no palpitations GI: no nausea, vomiting, abdominal pain, diarrhea, constipation GU: no dysuria, burning on urination, increased urinary frequency, hematuria  Ext: no leg edema Neuro: no unilateral weakness, numbness, or tingling, no vision change or hearing loss Skin: no rash, no skin  tear. MSK: Multiple joint swelling and pain Heme: No easy bruising.  Travel history: No recent long distant travel.  Allergy:  Allergies  Allergen Reactions  . Feosol [Iron] Itching    Past Medical History:  Diagnosis Date  . Arthritis   . Cholelithiasis   . Chronic knee pain   . Gallstone pancreatitis   . GERD (gastroesophageal reflux disease)   . Hypertension   . Memory loss     Past Surgical History:  Procedure Laterality Date  . CATARACT EXTRACTION  2016  . CHOLECYSTECTOMY N/A 10/04/2013   Procedure: LAPAROSCOPIC CHOLECYSTECTOMY WITH INTRAOPERATIVE CHOLANGIOGRAM;  Surgeon: Ralene Ok, MD;  Location: [place];  Service: General;  Laterality: N/A;  . EXTERNAL EAR SURGERY  years ago   right mastoidectomy    Social History:  reports that she has never smoked. She has never used smokeless tobacco. She reports that she does not drink alcohol or use drugs.  Family History:  Family History  Problem Relation Age of Onset  . Diabetes Father      Prior to Admission medications   Medication Sig Start Date End Date Taking? Authorizing Provider  gabapentin (NEURONTIN) 100 MG capsule Take 1 capsule (100 mg total) by mouth 2 (two) times daily. 06/19/17  Yes Mcarthur Rossetti, MD  hydrALAZINE (APRESOLINE) 50 MG tablet Take 1 tablet (50 mg total) by mouth 3 (three) times daily. 05/15/17  Yes Charlott Rakes, MD  cetirizine (ZYRTEC) 10 MG tablet Take 1 tablet (10 mg total) by mouth daily. Patient not taking: Reported on 03/29/2017 02/22/17   Charlott Rakes, MD  lactulose (CHRONULAC) 10 GM/15ML solution  Take 15 mLs (10 g total) by mouth 3 (three) times daily. Patient not taking: Reported on 09/21/2016 10/18/15   Charlott Rakes, MD  losartan-hydrochlorothiazide (HYZAAR) 100-25 MG tablet Take 1 tablet by mouth daily. Patient not taking: Reported on 07/20/2017 03/29/17   Charlott Rakes, MD  methylPREDNISolone (MEDROL) 4 MG tablet Medrol dose pack. Take as instructed Patient not taking:  Reported on 07/20/2017 06/19/17   Mcarthur Rossetti, MD  naproxen (NAPROSYN) 500 MG tablet Take 1 tablet (500 mg total) by mouth 2 (two) times daily between meals as needed. Patient not taking: Reported on 07/20/2017 06/19/17   Mcarthur Rossetti, MD  traMADol (ULTRAM) 50 MG tablet Take 1 tablet (50 mg total) by mouth 3 (three) times daily as needed. Patient not taking: Reported on 07/20/2017 06/19/17   Mcarthur Rossetti, MD    Physical Exam: Vitals:   07/20/17 0124 07/20/17 0145 07/20/17 0230 07/20/17 0300  BP:  126/61 (!) 125/56 126/60  Pulse:  (!) 101 92 91  Resp:      Temp: (!) 100.8 F (38.2 C)     TempSrc: Rectal     SpO2:  95% 94% 97%   General: Not in acute distress HEENT:       Eyes: PERRL, EOMI, no scleral icterus.       ENT: No discharge from the ears and nose, no pharynx injection, no tonsillar enlargement.        Neck: No JVD, no bruit, no mass felt. Heme: No neck lymph node enlargement. Cardiac: O7/F6, RRR, 4/6 systolic murmurs, No gallops or rubs. Respiratory: No rales, wheezing, rhonchi or rubs. GI: Soft, nondistended, nontender, no rebound pain, no organomegaly, BS present. GU: No hematuria Ext: No pitting leg edema bilaterally. 2+DP/PT pulse bilaterally. Musculoskeletal: has swelling, warmth and pain in wrist, knee and ankle, right joints are worse than the left.  Skin: No rashes.  Neuro: Alert, oriented X3, cranial nerves II-XII grossly intact, moves all extremities normally. Psych: Patient is not psychotic, no suicidal or hemocidal ideation.  Labs on Admission: I have personally reviewed following labs and imaging studies  CBC: Recent Labs  Lab 07/19/17 2248  WBC 2.8*  NEUTROABS 2.2  HGB 7.9*  HCT 26.4*  MCV 73.3*  PLT 433   Basic Metabolic Panel: Recent Labs  Lab 07/19/17 2248  NA 138  K 3.9  CL 109  CO2 22  GLUCOSE 97  BUN 9  CREATININE 0.69  CALCIUM 7.9*   GFR: CrCl cannot be calculated (Unknown ideal weight.). Liver Function  Tests: Recent Labs  Lab 07/19/17 2248  AST 27  ALT 15  ALKPHOS 60  BILITOT 1.0  PROT 6.3*  ALBUMIN 2.6*   No results for input(s): LIPASE, AMYLASE in the last 168 hours. No results for input(s): AMMONIA in the last 168 hours. Coagulation Profile: No results for input(s): INR, PROTIME in the last 168 hours. Cardiac Enzymes: No results for input(s): CKTOTAL, CKMB, CKMBINDEX, TROPONINI in the last 168 hours. BNP (last 3 results) No results for input(s): PROBNP in the last 8760 hours. HbA1C: No results for input(s): HGBA1C in the last 72 hours. CBG: No results for input(s): GLUCAP in the last 168 hours. Lipid Profile: No results for input(s): CHOL, HDL, LDLCALC, TRIG, CHOLHDL, LDLDIRECT in the last 72 hours. Thyroid Function Tests: No results for input(s): TSH, T4TOTAL, FREET4, T3FREE, THYROIDAB in the last 72 hours. Anemia Panel: No results for input(s): VITAMINB12, FOLATE, FERRITIN, TIBC, IRON, RETICCTPCT in the last 72 hours. Urine analysis:  Component Value Date/Time   COLORURINE YELLOW 07/19/2017 2341   APPEARANCEUR CLEAR 07/19/2017 2341   LABSPEC 1.010 07/19/2017 2341   PHURINE 6.0 07/19/2017 2341   GLUCOSEU NEGATIVE 07/19/2017 2341   HGBUR MODERATE (A) 07/19/2017 2341   BILIRUBINUR NEGATIVE 07/19/2017 2341   KETONESUR NEGATIVE 07/19/2017 2341   PROTEINUR NEGATIVE 07/19/2017 2341   UROBILINOGEN 1.0 10/22/2013 1100   NITRITE NEGATIVE 07/19/2017 2341   LEUKOCYTESUR NEGATIVE 07/19/2017 2341   Sepsis Labs: @LABRCNTIP (procalcitonin:4,lacticidven:4) )No results found for this or any previous visit (from the past 240 hour(s)).   Radiological Exams on Admission: No results found.   EKG:  Not done in ED, will get one.   Assessment/Plan Principal Problem:   Multiple joint pain Active Problems:   Microcytic anemia   SIRS (systemic inflammatory response syndrome) (HCC)   GERD (gastroesophageal reflux disease)   Multiple joint pain and swelling and SIRS vs  sepsis: Etiology is not clear, differential diagnosis include gout, septic joint, gonorrhea and inflammatory arthritis (but patient does not have rash).  Patient meets criteria for Sirs versus sepsis with fever, tachycardia and WBC<4.0.  Lactic acid is normal.  - will admit to MedSurg bed as inpatient. - Start Rocephin, follow-up blood culture - Solu-Medrol 40 mg daily - As needed Percocet and Tylenol for pain - IVF: Normal saline 100 cc/h (no NS bolus since lactic acid is normal, also patient has loud systolic murmur, needs to be careful to avoid fluid overload) - check RPR, ESR, CRP, uric acid - May need to consult ortho in AM.  HTN:  Hold blood pressure medication since patient at risk of developing hypotension -IV hydralazine prn  GERD: -Protonix prn  Microcytic anemia: Patient's hemoglobin seems to be slowly trending down, 7.9 on admission.  Patient denies hematochezia or hematuria.  She has history of pancytopenia with unclear etiology.  Today platelets are normal, but a WBC 2.8. -Check FOBT -Anemia panel - LDH, peripheral smear and haptoglobin   DVT ppx: SQ Heparin  Code Status: Full code Family Communication: None at bed side.     Disposition Plan:  Anticipate discharge back to previous home environment Consults called:  none Admission status:   medical floor/obs     Date of Service 07/20/2017    Ivor Costa [facility] Pager [phone]  If 7PM-7AM, please contact night-coverage [email or web address] Password [facility] 07/20/2017, 3:49 AM

## 2017-07-20 NOTE — ED Notes (Signed)
Pt's family at bedside - speaks [place]. Pt requested for them to translate rather than I-pad translator. Aware pt had been assigned room 5N05. Family member attempted to administer pt her Neurontin and Hydralazine. Asked family member not to do so d/t she will be given meds in hospital and currently her BP meds are being held d/t hypotension. Voiced understanding and did not give pt home meds.

## 2017-07-21 ENCOUNTER — Inpatient Hospital Stay (HOSPITAL_COMMUNITY): Payer: Medicaid Other

## 2017-07-21 LAB — CBC WITH DIFFERENTIAL/PLATELET
ABS IMMATURE GRANULOCYTES: 0 10*3/uL (ref 0.0–0.1)
BASOS ABS: 0 10*3/uL (ref 0.0–0.1)
Basophils Relative: 0 %
Eosinophils Absolute: 0 10*3/uL (ref 0.0–0.7)
Eosinophils Relative: 0 %
HCT: 24.1 % — ABNORMAL LOW (ref 36.0–46.0)
HEMOGLOBIN: 7.3 g/dL — AB (ref 12.0–15.0)
Immature Granulocytes: 1 %
LYMPHS PCT: 25 %
Lymphs Abs: 1 10*3/uL (ref 0.7–4.0)
MCH: 22.1 pg — ABNORMAL LOW (ref 26.0–34.0)
MCHC: 30.3 g/dL (ref 30.0–36.0)
MCV: 72.8 fL — ABNORMAL LOW (ref 78.0–100.0)
Monocytes Absolute: 0.2 10*3/uL (ref 0.1–1.0)
Monocytes Relative: 6 %
NEUTROS ABS: 2.7 10*3/uL (ref 1.7–7.7)
Neutrophils Relative %: 68 %
Platelets: 193 10*3/uL (ref 150–400)
RBC: 3.31 MIL/uL — AB (ref 3.87–5.11)
RDW: 18.3 % — ABNORMAL HIGH (ref 11.5–15.5)
WBC: 3.9 10*3/uL — ABNORMAL LOW (ref 4.0–10.5)

## 2017-07-21 LAB — BASIC METABOLIC PANEL
ANION GAP: 5 (ref 5–15)
BUN: 14 mg/dL (ref 6–20)
CHLORIDE: 113 mmol/L — AB (ref 101–111)
CO2: 23 mmol/L (ref 22–32)
Calcium: 8.1 mg/dL — ABNORMAL LOW (ref 8.9–10.3)
Creatinine, Ser: 0.72 mg/dL (ref 0.44–1.00)
GFR calc non Af Amer: >60 mL/min (ref >60)
Glucose, Bld: 111 mg/dL — ABNORMAL HIGH (ref 65–99)
POTASSIUM: 4.1 mmol/L (ref 3.5–5.1)
SODIUM: 141 mmol/L (ref 135–145)

## 2017-07-21 LAB — HAPTOGLOBIN: Haptoglobin: 157 mg/dL (ref 34–200)

## 2017-07-21 LAB — OCCULT BLOOD X 1 CARD TO LAB, STOOL: FECAL OCCULT BLD: NEGATIVE

## 2017-07-21 LAB — URINE CULTURE: Culture: <10000 — AB

## 2017-07-21 LAB — PREPARE RBC (CROSSMATCH)

## 2017-07-21 MED ORDER — DIPHENHYDRAMINE HCL 25 MG PO CAPS
25.0000 mg | ORAL_CAPSULE | Freq: Once | ORAL | Status: AC
  Administered 2017-07-21: 25 mg via ORAL
  Filled 2017-07-21: qty 1

## 2017-07-21 MED ORDER — SODIUM CHLORIDE 0.9 % IV SOLN
Freq: Once | INTRAVENOUS | Status: AC
  Administered 2017-07-21: 10:00:00 via INTRAVENOUS

## 2017-07-21 MED ORDER — HYDRALAZINE HCL 25 MG PO TABS
25.0000 mg | ORAL_TABLET | Freq: Three times a day (TID) | ORAL | Status: DC
  Administered 2017-07-21 – 2017-07-23 (×6): 25 mg via ORAL
  Filled 2017-07-21 (×6): qty 1

## 2017-07-21 MED ORDER — VITAMIN B-12 1000 MCG PO TABS
1000.0000 ug | ORAL_TABLET | Freq: Every day | ORAL | Status: DC
  Administered 2017-07-21 – 2017-07-23 (×3): 1000 ug via ORAL
  Filled 2017-07-21 (×3): qty 1

## 2017-07-21 MED ORDER — ACETAMINOPHEN 325 MG PO TABS
650.0000 mg | ORAL_TABLET | Freq: Once | ORAL | Status: AC
  Administered 2017-07-21: 650 mg via ORAL
  Filled 2017-07-21: qty 2

## 2017-07-21 NOTE — Progress Notes (Signed)
PROGRESS NOTE  Michelle Boyd WVP:[id number] DOB: 01-Nov-1950 DOA: 07/19/2017 PCP: Charlott Rakes, MD  HPI/Recap of past 24 hours: Patient is doing much better she is afebrile.  Her pain is improved and the swelling on the left hand is also improved  Assessment/Plan: Principal Problem:   Multiple joint pain Active Problems:   Microcytic anemia   SIRS (systemic inflammatory response syndrome) (HCC)   GERD (gastroesophageal reflux disease)   Multiple joint pain and swelling and SIRS vs sepsis: Etiology is not clear, differential diagnosis include gout, septic joint, gonorrhea and inflammatory arthritis (but patient does not have rash).  Patient meets criteria for Sirs versus sepsis with fever, tachycardia and WBC<4.0.  Lactic acid is normal.   -  Rocephin, follow-up blood culture - Solu-Medrol 40 mg daily - As needed Percocet and Tylenol for pain - IVF: Normal saline 100 cc/h (no NS bolus since lactic acid is normal - check RPR, ESR, CRP, uric acid Rheumatoid factor and ANA pending  HTN:  restart  blood pressure medication  -IV hydralazine prn  GERD: -Protonix prn  Microcytic anemia: Patient's hemoglobin seems to be slowly trending down, 7.9 on admission.  Patient denies hematochezia or hematuria.  She has history of pancytopenia with unclear etiology.  Today platelets are normal, but a WBC 2.8. -Check FOBT pending -Anemia  Transfuse 1 unit PRBC - LDH, peripheral smear and haptoglobin vit b12 def will star b12 pills     DVT ppx: SQ Heparin  Code Status: Full code Family Communication:spoke  Son at bedside   Disposition Plan:  Anticipate discharge back to previous home environment Consults called:  none Admission status:    Inpatient   Procedures:  non  Antimicrobials: Rocephin,       Objective: Vitals:   07/20/17 2011 07/21/17 0413 07/21/17 0951 07/21/17 1045  BP: (!) 165/76 (!) 153/76 (!) 145/75 (!) 149/71  Pulse: 79 65 69 69  Resp:   15 15  Temp:  97.6 F (36.4 C) 97.6 F (36.4 C) 98 F (36.7 C) 98.2 F (36.8 C)  TempSrc: Oral Oral Oral Oral  SpO2: 100% 99% 100% 99%    Intake/Output Summary (Last 24 hours) at 07/21/2017 1053 Last data filed at 07/20/2017 1636 Gross per 24 hour  Intake 600 ml  Output -  Net 600 ml   There were no vitals filed for this visit. There is no height or weight on file to calculate BMI.  Exam:   General: Not in acute distress she is afebrile HEENT:       Eyes: PERRL, EOMI, no scleral icterus.       ENT: No discharge from the ears and nose, no pharynx injection, no tonsillar enlargement.        Neck: No JVD, no bruit, no mass felt. Heme: No neck lymph node enlargement. Cardiac: N4/O2, RRR, 4/6 systolic murmurs, No gallops or rubs. Respiratory: No rales, wheezing, rhonchi or rubs. GI: Soft, nondistended, nontender, no rebound pain, no organomegaly, BS present. GU: No hematuria Ext: No pitting leg edema bilaterally. 2+DP/PT pulse bilaterally. Musculoskeletal: has swelling, but better and pain in right wrist,  swelling and pain on the left wrist is resolved  skin: No rashes.  Neuro: Alert, oriented X3, cranial nerves II-XII grossly intact, moves all extremities normally. Psych: Patient is not psychotic, no suicidal or hemocidal ideation.    Data Reviewed: CBC: Recent Labs  Lab 07/19/17 2248 07/20/17 0413 07/21/17 0335  WBC 2.8* 2.7* 3.9*  NEUTROABS 2.2  --  2.7  HGB 7.9* 8.4* 7.3*  HCT 26.4* 27.4* 24.1*  MCV 73.3* 72.1* 72.8*  PLT 185 191 465   Basic Metabolic Panel: Recent Labs  Lab 07/19/17 2248 07/20/17 0413 07/21/17 0335  NA 138 134* 141  K 3.9 4.0 4.1  CL 109 107 113*  CO2 22 21* 23  GLUCOSE 97 87 111*  BUN 9 9 14   CREATININE 0.69 0.67 0.72  CALCIUM 7.9* 7.6* 8.1*   GFR: CrCl cannot be calculated (Unknown ideal weight.). Liver Function Tests: Recent Labs  Lab 07/19/17 2248  AST 27  ALT 15  ALKPHOS 60  BILITOT 1.0  PROT 6.3*  ALBUMIN 2.6*   No results for  input(s): LIPASE, AMYLASE in the last 168 hours. No results for input(s): AMMONIA in the last 168 hours. Coagulation Profile: Recent Labs  Lab 07/20/17 0413  INR 1.27   Cardiac Enzymes: No results for input(s): CKTOTAL, CKMB, CKMBINDEX, TROPONINI in the last 168 hours. BNP (last 3 results) No results for input(s): PROBNP in the last 8760 hours. HbA1C: No results for input(s): HGBA1C in the last 72 hours. CBG: No results for input(s): GLUCAP in the last 168 hours. Lipid Profile: No results for input(s): CHOL, HDL, LDLCALC, TRIG, CHOLHDL, LDLDIRECT in the last 72 hours. Thyroid Function Tests: No results for input(s): TSH, T4TOTAL, FREET4, T3FREE, THYROIDAB in the last 72 hours. Anemia Panel: Recent Labs    07/20/17 2007  VITAMINB12 170*  FOLATE 8.2  FERRITIN 69  TIBC 284  IRON 19*  RETICCTPCT 1.1   Urine analysis:    Component Value Date/Time   COLORURINE YELLOW 07/19/2017 [place] 07/19/2017 2341   LABSPEC 1.010 07/19/2017 2341   PHURINE 6.0 07/19/2017 2341   GLUCOSEU NEGATIVE 07/19/2017 2341   HGBUR MODERATE (A) 07/19/2017 2341   BILIRUBINUR NEGATIVE 07/19/2017 2341   KETONESUR NEGATIVE 07/19/2017 2341   PROTEINUR NEGATIVE 07/19/2017 2341   UROBILINOGEN 1.0 10/22/2013 1100   NITRITE NEGATIVE 07/19/2017 2341   LEUKOCYTESUR NEGATIVE 07/19/2017 2341   Sepsis Labs: @LABRCNTIP (procalcitonin:4,lacticidven:4)  ) Recent Results (from the past 240 hour(s))  Urine culture     Status: Abnormal   Collection Time: 07/19/17 11:41 PM  Result Value Ref Range Status   Specimen Description URINE, RANDOM  Final   Special Requests NONE  Final   Culture (A)  Final    <10,000 COLONIES/mL INSIGNIFICANT GROWTH Performed at [facility], [place] [place], [place] [address]    Report Status 07/21/2017 FINAL  Final      Studies: No results found.  Scheduled Meds: . gabapentin  100 mg Oral BID  . heparin  5,000 Units Subcutaneous Q8H  .  methylPREDNISolone (SOLU-MEDROL) injection  40 mg Intravenous Daily  . vitamin B-12  1,000 mcg Oral Daily    Continuous Infusions: . sodium chloride 50 mL/hr at 07/20/17 1636  . sodium chloride    . cefTRIAXone (ROCEPHIN)  IV Stopped (07/20/17 2103)     LOS: 1 day     Cristal Deer, MD [facility]  To reach me or the doctor on call, go to: [email or web address] Password [facility]  07/21/2017, 10:53 AM

## 2017-07-21 NOTE — Progress Notes (Signed)
08 Hgb 7.3, Dr Barrie FolkUgah notified. Will give blood transfusion today. Pt's son speaks [place] translated it to the pt. Pt agreed for blood transfusion. 1030 Blood transfusion started and completed. No transfusion reaction noted.

## 2017-07-22 LAB — TYPE AND SCREEN
ABO/RH(D): A POS
ANTIBODY SCREEN: NEGATIVE
UNIT DIVISION: 0

## 2017-07-22 LAB — CBC WITH DIFFERENTIAL/PLATELET
ABS IMMATURE GRANULOCYTES: 0.1 10*3/uL (ref 0.0–0.1)
BASOS ABS: 0 10*3/uL (ref 0.0–0.1)
Basophils Relative: 0 %
EOS PCT: 0 %
Eosinophils Absolute: 0 10*3/uL (ref 0.0–0.7)
HCT: 29.3 % — ABNORMAL LOW (ref 36.0–46.0)
Hemoglobin: 9.1 g/dL — ABNORMAL LOW (ref 12.0–15.0)
Immature Granulocytes: 1 %
Lymphocytes Relative: 25 %
Lymphs Abs: 1.6 10*3/uL (ref 0.7–4.0)
MCH: 23 pg — AB (ref 26.0–34.0)
MCHC: 31.1 g/dL (ref 30.0–36.0)
MCV: 74.2 fL — ABNORMAL LOW (ref 78.0–100.0)
MONO ABS: 0.6 10*3/uL (ref 0.1–1.0)
Monocytes Relative: 9 %
NEUTROS ABS: 4.1 10*3/uL (ref 1.7–7.7)
Neutrophils Relative %: 65 %
PLATELETS: 239 10*3/uL (ref 150–400)
RBC: 3.95 MIL/uL (ref 3.87–5.11)
RDW: 18 % — ABNORMAL HIGH (ref 11.5–15.5)
WBC: 6.3 10*3/uL (ref 4.0–10.5)

## 2017-07-22 LAB — ANA W/REFLEX IF POSITIVE: ANA: POSITIVE — AB

## 2017-07-22 LAB — ENA+DNA/DS+ANTICH+CENTRO+JO...
Anti JO-1: <0.2 AI (ref 0.0–0.9)
Centromere Ab Screen: <0.2 AI (ref 0.0–0.9)
ENA SM Ab Ser-aCnc: <0.2 AI (ref 0.0–0.9)
Ribonucleic Protein: <0.2 AI (ref 0.0–0.9)
SSA (Ro) (ENA) Antibody, IgG: <0.2 AI (ref 0.0–0.9)
Scleroderma (Scl-70) (ENA) Antibody, IgG: <0.2 AI (ref 0.0–0.9)
ds DNA Ab: 1 IU/mL (ref 0–9)

## 2017-07-22 LAB — BPAM RBC
Blood Product Expiration Date: 201906052359
ISSUE DATE / TIME: 201906021013
Unit Type and Rh: 9500

## 2017-07-22 LAB — PATHOLOGIST SMEAR REVIEW

## 2017-07-22 LAB — RHEUMATOID FACTOR

## 2017-07-22 MED ORDER — FERROUS SULFATE 325 (65 FE) MG PO TABS
325.0000 mg | ORAL_TABLET | Freq: Two times a day (BID) | ORAL | Status: DC
  Administered 2017-07-22 – 2017-07-23 (×3): 325 mg via ORAL
  Filled 2017-07-22 (×3): qty 1

## 2017-07-22 NOTE — Progress Notes (Signed)
Pt ambulated in the whole unit assisted by family member, tolerated well,denies pain and nausea.

## 2017-07-22 NOTE — Progress Notes (Signed)
Pt ambulated in the whole unit, tolerated well, denies nausea and pain.

## 2017-07-22 NOTE — Progress Notes (Signed)
PROGRESS NOTE    Michelle Boyd  ZOX:[id number] DOB: 10/28/1950 DOA: 07/19/2017 PCP: Hoy RegisterNewlin, Enobong, MD   Brief Narrative:  [age] year old female with history of osteoarthritis, TIA, GERD, pancytopenia, essential hypertension came to the hospital complains of multiple joint pain, swelling and fever.  Patient is a Event organiserepali speaker.  She states the swelling and pain had gotten worse over the course of a week therefore came to the hospital for further evaluation.  In the ER she had low-grade temperature therefore there was concerns of septic arthritis.   Assessment & Plan:   Principal Problem:   Multiple joint pain Active Problems:   Microcytic anemia   SIRS (systemic inflammatory response syndrome) (HCC)   GERD (gastroesophageal reflux disease)  Inflammatory versus infectious arthritis SIRS -No clear source of infection identified at this time.  Patient is empirically on Rocephin -Continue pain control, Solu-Medrol 40 mg daily.  Her pain has been improving -Continue normal saline 100 cc/h. - Seen by [facility] orthopedic outpatient about a month ago-she was prescribed naproxen 500 mg for right knee arthritis and due to concerns of tenosynovitis of her right hand extended she had a steroid injection. -UA is negative.  Lung sounds clear. Stop ABx  Anemia of chronic disease with component of iron deficiency Vitamin B12 deficiency - We will start patient on iron supplements. -Start B12 supplements  Essential hypertension - IV PRN medications  DVT prophylaxis: Subcutaneous heparin  Code Status: full code Family Communication: Son at bedside Disposition Plan: Discharge in next 24 hours if Cx remain negative.   Consultants:   None  Procedures:   None  Antimicrobials:   Rocephin 6/1 >6/3   Subjective: No complaints. Feels better, join pain has pretty much resolved. Has not tired walking around much.   Review of Systems Otherwise negative except as per HPI, including: General:  Denies fever, chills, night sweats or unintended weight loss. Resp: Denies cough, wheezing, shortness of breath. Cardiac: Denies chest pain, palpitations, orthopnea, paroxysmal nocturnal dyspnea. GI: Denies abdominal pain, nausea, vomiting, diarrhea or constipation GU: Denies dysuria, frequency, hesitancy or incontinence MS: Denies muscle aches, joint pain or swelling Neuro: Denies headache, neurologic deficits (focal weakness, numbness, tingling), abnormal gait Psych: Denies anxiety, depression, SI/HI/AVH Skin: Denies new rashes or lesions ID: Denies sick contacts, exotic exposures, travel  Objective: Vitals:   07/21/17 1315 07/21/17 1554 07/21/17 2047 07/22/17 0515  BP: (!) 154/81 138/62 (!) 174/72 (!) 163/74  Pulse: 69 72 70 60  Resp: 14     Temp: 98.6 F (37 C) 98.2 F (36.8 C) 98.3 F (36.8 C) 98.6 F (37 C)  TempSrc: Oral Oral Oral Oral  SpO2: 98% 98% 100% 98%    Intake/Output Summary (Last 24 hours) at 07/22/2017 1138 Last data filed at 07/22/2017 0700 Gross per 24 hour  Intake 1488 ml  Output -  Net 1488 ml   There were no vitals filed for this visit.  Examination:  General exam: Appears calm and comfortable  Respiratory system: Clear to auscultation. Respiratory effort normal. Cardiovascular system: S1 & S2 heard, RRR. No JVD, murmurs, rubs, gallops or clicks. No pedal edema. Gastrointestinal system: Abdomen is nondistended, soft and nontender. No organomegaly or masses felt. Normal bowel sounds heard. Central nervous system: Alert and oriented. No focal neurological deficits. Extremities: Symmetric 5 x 5 power. Skin: No rashes, lesions or ulcers Psychiatry: Judgement and insight appear normal. Mood & affect appropriate.     Data Reviewed:   CBC: Recent Labs  Lab 07/19/17 2248 07/20/17 0413  07/21/17 0335 07/22/17 0354  WBC 2.8* 2.7* 3.9* 6.3  NEUTROABS 2.2  --  2.7 4.1  HGB 7.9* 8.4* 7.3* 9.1*  HCT 26.4* 27.4* 24.1* 29.3*  MCV 73.3* 72.1* 72.8* 74.2*    PLT 185 191 193 239   Basic Metabolic Panel: Recent Labs  Lab 07/19/17 2248 07/20/17 0413 07/21/17 0335  NA 138 134* 141  K 3.9 4.0 4.1  CL 109 107 113*  CO2 22 21* 23  GLUCOSE 97 87 111*  BUN 9 9 14   CREATININE 0.69 0.67 0.72  CALCIUM 7.9* 7.6* 8.1*   GFR: CrCl cannot be calculated (Unknown ideal weight.). Liver Function Tests: Recent Labs  Lab 07/19/17 2248  AST 27  ALT 15  ALKPHOS 60  BILITOT 1.0  PROT 6.3*  ALBUMIN 2.6*   No results for input(s): LIPASE, AMYLASE in the last 168 hours. No results for input(s): AMMONIA in the last 168 hours. Coagulation Profile: Recent Labs  Lab 07/20/17 0413  INR 1.27   Cardiac Enzymes: No results for input(s): CKTOTAL, CKMB, CKMBINDEX, TROPONINI in the last 168 hours. BNP (last 3 results) No results for input(s): PROBNP in the last 8760 hours. HbA1C: No results for input(s): HGBA1C in the last 72 hours. CBG: No results for input(s): GLUCAP in the last 168 hours. Lipid Profile: No results for input(s): CHOL, HDL, LDLCALC, TRIG, CHOLHDL, LDLDIRECT in the last 72 hours. Thyroid Function Tests: No results for input(s): TSH, T4TOTAL, FREET4, T3FREE, THYROIDAB in the last 72 hours. Anemia Panel: Recent Labs    07/20/17 2007  VITAMINB12 170*  FOLATE 8.2  FERRITIN 69  TIBC 284  IRON 19*  RETICCTPCT 1.1   Sepsis Labs: Recent Labs  Lab 07/20/17 0152 07/20/17 0310 07/20/17 0413  PROCALCITON  --   --  <0.10  LATICACIDVEN 0.7 0.6 0.8    Recent Results (from the past 240 hour(s))  Urine culture     Status: Abnormal   Collection Time: 07/19/17 11:41 PM  Result Value Ref Range Status   Specimen Description URINE, RANDOM  Final   Special Requests NONE  Final   Culture (A)  Final    <10,000 COLONIES/mL INSIGNIFICANT GROWTH Performed at [facility], [address], [place], [place] [address]    Report Status 07/21/2017 FINAL  Final  Culture, blood (x 2)     Status: None (Preliminary result)   Collection  Time: 07/20/17  4:00 AM  Result Value Ref Range Status   Specimen Description BLOOD LEFT ANTECUBITAL  Final   Special Requests   Final    BOTTLES DRAWN AEROBIC AND ANAEROBIC Blood Culture adequate volume   Culture   Final    NO GROWTH 1 DAY Performed at [facility], [address], [place], [place] [address]    Report Status PENDING  Incomplete  Culture, blood (x 2)     Status: None (Preliminary result)   Collection Time: 07/20/17  4:30 AM  Result Value Ref Range Status   Specimen Description BLOOD RIGHT THUMB  Final   Special Requests   Final    BOTTLES DRAWN AEROBIC ONLY Blood Culture results may not be optimal due to an inadequate volume of blood received in culture bottles   Culture   Final    NO GROWTH 1 DAY Performed at [facility], [address], [place], [place] [address]    Report Status PENDING  Incomplete         Radiology Studies: Dg Hand 2 View  Right  Result Date: 07/21/2017 CLINICAL DATA:  Acute onset of pain and swelling involving the RIGHT hand. EXAM: RIGHT HAND - 2 VIEW COMPARISON:  None. FINDINGS: No evidence of acute or subacute fracture or dislocation. Joint spaces well preserved. Mild osseous demineralization is suspected. IMPRESSION: No acute or subacute osseous abnormalities. Possible mild osseous demineralization. Electronically Signed   By: Hulan Saas M.D.   On: 07/21/2017 11:46   Dg Hand 2 View Left  Result Date: 07/21/2017 CLINICAL DATA:  Acute onset of pain and swelling involving the LEFT hand. EXAM: LEFT HAND - 2 VIEW COMPARISON:  None. FINDINGS: No evidence of acute or subacute fracture or dislocation. Joint spaces well preserved. Mild osseous demineralization is suspected. IMPRESSION: No acute or subacute osseous abnormality. Possible mild osseous demineralization. Electronically Signed   By: Hulan Saas M.D.   On: 07/21/2017 11:47        Scheduled Meds: . gabapentin  100 mg Oral BID  . heparin  5,000 Units Subcutaneous  Q8H  . hydrALAZINE  25 mg Oral Q8H  . methylPREDNISolone (SOLU-MEDROL) injection  40 mg Intravenous Daily  . vitamin B-12  1,000 mcg Oral Daily   Continuous Infusions: . sodium chloride 50 mL/hr at 07/21/17 1628  . cefTRIAXone (ROCEPHIN)  IV Stopped (07/21/17 2122)     LOS: 2 days    I have spent 35 minutes face to face with the patient and on the ward discussing the patients care, assessment, plan and disposition with other care givers. >50% of the time was devoted counseling the patient about the risks and benefits of treatment and coordinating care.     Berit Raczkowski Joline Maxcy, MD [facility] Pager [phone]   If 7PM-7AM, please contact night-coverage [email or web address] Password [facility] 07/22/2017, 11:38 AM

## 2017-07-22 NOTE — Plan of Care (Signed)
  Problem: Nutrition: Goal: Adequate nutrition will be maintained Outcome: Progressing   Problem: Elimination: Goal: Will not experience complications related to bowel motility Outcome: Progressing   Problem: Pain Managment: Goal: General experience of comfort will improve Outcome: Progressing   Problem: Safety: Goal: Ability to remain free from injury will improve Outcome: Progressing   

## 2017-07-23 MED ORDER — PREDNISONE 50 MG PO TABS: 50.0000 mg | ORAL_TABLET | Freq: Every day | ORAL | 0 refills | Status: AC

## 2017-07-23 MED ORDER — CYANOCOBALAMIN 1000 MCG PO TABS: 1000.0000 ug | ORAL_TABLET | Freq: Every day | ORAL | 0 refills | Status: AC

## 2017-07-23 MED ORDER — FERROUS SULFATE 325 (65 FE) MG PO TABS: 325.0000 mg | ORAL_TABLET | Freq: Two times a day (BID) | ORAL | 0 refills | Status: AC

## 2017-07-23 NOTE — Discharge Summary (Signed)
Physician Discharge Summary  Michelle Boyd ZOX:[id number] DOB: 1950-03-12 DOA: 07/19/2017  PCP: Hoy Register, MD  Admit date: 07/19/2017 Discharge date: 07/23/2017  Admitted From: Home  Disposition:  Home  Recommendations for Outpatient Follow-up:  1. Follow up with PCP in 1-2 weeks 2. Please obtain BMP/CBC in one week your next doctors visit.  3. Prescribed vitamin B12 times a micrograms to be taken daily 4. Ferrous sulfate 325 mg twice daily with meals.  Use bowel regimen PRN 5. Prednisone 50 mg daily for 5 days with breakfast.  Discharge Condition: Stable CODE STATUS: Full  Diet recommendation: 2g Na diet  Brief/Interim Summary: 67 year old female with history of osteoarthritis, TIA, GERD, pancytopenia, essential hypertension came to the hospital complains of multiple joint pain, swelling and fever.  Patient is a Event organiser.  She states the swelling and pain had gotten worse over the course of a week therefore came to the hospital for further evaluation.  In the ER she had low-grade temperature therefore there was concerns of septic arthritis.  By the time I saw the patient her joint pain and swelling had resolved.  Did not have any signs of active bleeding.  She remained afebrile.  She did well on daily oral Solu-Medrol therefore will be discharged on 5 days of oral prednisone and follow-up with outpatient primary care physician in 1 week.  She can also take over-the-counter naproxen if necessary for the arthritic pain.  At this point patient has reached maximum benefit from an hospital stay therefore stable to be discharged with outpatient follow-up recommendations as stated above.      Discharge Diagnoses:  Principal Problem:   Multiple joint pain Active Problems:   Microcytic anemia   SIRS (systemic inflammatory response syndrome) (HCC)   GERD (gastroesophageal reflux disease)   Inflammatory versus infectious arthritis, resolved SIRS, resolved -No clear source of  infection.  Was on empiric Rocephin here which I will discontinue at the time of discharge. -Continue pain control.  Discontinue IV Solu-Medrol and switch her over to 5 days of oral prednisone. -Follow-up with outpatient orthopedic and primary care physician. - Seen by [facility] orthopedic outpatient about a month ago-she was prescribed naproxen 500 mg for right knee arthritis and due to concerns of tenosynovitis of her right hand extended she had a steroid injection. -UA is negative.  Lung sounds clear.   Anemia of chronic disease with component of iron deficiency Vitamin B12 deficiency - Started iron and B12 supplements.  Bowel regimen.  Essential hypertension - 2 g salt diet.  Recommend routinely checking blood pressure at home.  Attempted to call the son on the day of discharge but he did not answer and therefore I walked him a voicemail for him to call me back therefore again updated them.  Discharge Instructions   Allergies as of 07/23/2017      Reactions   Feosol [iron] Itching      Medication List    TAKE these medications   cetirizine 10 MG tablet Commonly known as:  ZYRTEC Take 1 tablet (10 mg total) by mouth daily.   cyanocobalamin 1000 MCG tablet Take 1 tablet (1,000 mcg total) by mouth daily.   ferrous sulfate 325 (65 FE) MG tablet Take 1 tablet (325 mg total) by mouth 2 (two) times daily with a meal.   gabapentin 100 MG capsule Commonly known as:  NEURONTIN Take 1 capsule (100 mg total) by mouth 2 (two) times daily.   hydrALAZINE 50 MG tablet Commonly known as:  APRESOLINE  Take 1 tablet (50 mg total) by mouth 3 (three) times daily.   lactulose 10 GM/15ML solution Commonly known as:  CHRONULAC Take 15 mLs (10 g total) by mouth 3 (three) times daily.   losartan-hydrochlorothiazide 100-25 MG tablet Commonly known as:  HYZAAR Take 1 tablet by mouth daily.   methylPREDNISolone 4 MG tablet Commonly known as:  MEDROL Medrol dose pack. Take as instructed    naproxen 500 MG tablet Commonly known as:  NAPROSYN Take 1 tablet (500 mg total) by mouth 2 (two) times daily between meals as needed.   predniSONE 50 MG tablet Commonly known as:  DELTASONE Take 1 tablet (50 mg total) by mouth daily with breakfast for 5 days.   traMADol 50 MG tablet Commonly known as:  ULTRAM Take 1 tablet (50 mg total) by mouth 3 (three) times daily as needed.      Follow-up Information    Hoy Register, MD. Schedule an appointment as soon as possible for a visit in 1 week(s).   Specialty:  Family Medicine Contact information: [address] [place] [address] [phone]          Allergies  Allergen Reactions  . Feosol [Iron] Itching    You were cared for by a hospitalist during your hospital stay. If you have any questions about your discharge medications or the care you received while you were in the hospital after you are discharged, you can call the unit and asked to speak with the hospitalist on call if the hospitalist that took care of you is not available. Once you are discharged, your primary care physician will handle any further medical issues. Please note that no refills for any discharge medications will be authorized once you are discharged, as it is imperative that you return to your primary care physician (or establish a relationship with a primary care physician if you do not have one) for your aftercare needs so that they can reassess your need for medications and monitor your lab values.  Consultations:  None   Procedures/Studies: Dg Hand 2 View Right  Result Date: 07/21/2017 CLINICAL DATA:  Acute onset of pain and swelling involving the RIGHT hand. EXAM: RIGHT HAND - 2 VIEW COMPARISON:  None. FINDINGS: No evidence of acute or subacute fracture or dislocation. Joint spaces well preserved. Mild osseous demineralization is suspected. IMPRESSION: No acute or subacute osseous abnormalities. Possible mild osseous demineralization.  Electronically Signed   By: Hulan Saas M.D.   On: 07/21/2017 11:46   Dg Hand 2 View Left  Result Date: 07/21/2017 CLINICAL DATA:  Acute onset of pain and swelling involving the LEFT hand. EXAM: LEFT HAND - 2 VIEW COMPARISON:  None. FINDINGS: No evidence of acute or subacute fracture or dislocation. Joint spaces well preserved. Mild osseous demineralization is suspected. IMPRESSION: No acute or subacute osseous abnormality. Possible mild osseous demineralization. Electronically Signed   By: Hulan Saas M.D.   On: 07/21/2017 11:47      Subjective: Doing well, she wants to go home.  No complaints at all.  General = no fevers, chills, dizziness, malaise, fatigue HEENT/EYES = negative for pain, redness, loss of vision, double vision, blurred vision, loss of hearing, sore throat, hoarseness, dysphagia Cardiovascular= negative for chest pain, palpitation, murmurs, lower extremity swelling Respiratory/lungs= negative for shortness of breath, cough, hemoptysis, wheezing, mucus production Gastrointestinal= negative for nausea, vomiting,, abdominal pain, melena, hematemesis Genitourinary= negative for Dysuria, Hematuria, Change in Urinary Frequency MSK = Negative for arthralgia, myalgias, Back Pain, Joint  swelling  Neurology= Negative for headache, seizures, numbness, tingling  Psychiatry= Negative for anxiety, depression, suicidal and homocidal ideation Allergy/Immunology= Medication/Food allergy as listed  Skin= Negative for Rash, lesions, ulcers, itching   Discharge Exam: Vitals:   07/22/17 2105 07/23/17 0440  BP: (!) 152/73 (!) 149/80  Pulse: 69 76  Resp: 16 17  Temp: 97.9 F (36.6 C) 98 F (36.7 C)  SpO2: 97% 100%   Vitals:   07/22/17 0515 07/22/17 1503 07/22/17 2105 07/23/17 0440  BP: (!) 163/74 (!) 171/78 (!) 152/73 (!) 149/80  Pulse: 60 72 69 76  Resp:  18 16 17   Temp: 98.6 F (37 C) 98.1 F (36.7 C) 97.9 F (36.6 C) 98 F (36.7 C)  TempSrc: Oral Oral Oral Oral   SpO2: 98% 98% 97% 100%    General: Pt is alert, awake, not in acute distress Cardiovascular: RRR, S1/S2 +, no rubs, no gallops Respiratory: CTA bilaterally, no wheezing, no rhonchi Abdominal: Soft, NT, ND, bowel sounds + Extremities: no edema, no cyanosis    The results of significant diagnostics from this hospitalization (including imaging, microbiology, ancillary and laboratory) are listed below for reference.     Microbiology: Recent Results (from the past 240 hour(s))  Urine culture     Status: Abnormal   Collection Time: 07/19/17 11:41 PM  Result Value Ref Range Status   Specimen Description URINE, RANDOM  Final   Special Requests NONE  Final   Culture (A)  Final    <10,000 COLONIES/mL INSIGNIFICANT GROWTH Performed at [facility], [address] [place], [place] [address]    Report Status 07/21/2017 FINAL  Final  Culture, blood (x 2)     Status: None (Preliminary result)   Collection Time: 07/20/17  4:00 AM  Result Value Ref Range Status   Specimen Description BLOOD LEFT ANTECUBITAL  Final   Special Requests   Final    BOTTLES DRAWN AEROBIC AND ANAEROBIC Blood Culture adequate volume   Culture   Final    NO GROWTH 2 DAYS Performed at [facility], [address], [place], [place] [address]    Report Status PENDING  Incomplete  Culture, blood (x 2)     Status: None (Preliminary result)   Collection Time: 07/20/17  4:30 AM  Result Value Ref Range Status   Specimen Description BLOOD RIGHT THUMB  Final   Special Requests   Final    BOTTLES DRAWN AEROBIC ONLY Blood Culture results may not be optimal due to an inadequate volume of blood received in culture bottles   Culture   Final    NO GROWTH 2 DAYS Performed at [facility], [address], [place], [place] [address]    Report Status PENDING  Incomplete     Labs: BNP (last 3 results) Recent Labs    02/10/17 1030 04/21/17 0202  BNP 23.6 20.9   Basic Metabolic Panel: Recent Labs  Lab  07/19/17 2248 07/20/17 0413 07/21/17 0335  NA 138 134* 141  K 3.9 4.0 4.1  CL 109 107 113*  CO2 22 21* 23  GLUCOSE 97 87 111*  BUN 9 9 14   CREATININE 0.69 0.67 0.72  CALCIUM 7.9* 7.6* 8.1*   Liver Function Tests: Recent Labs  Lab 07/19/17 2248  AST 27  ALT 15  ALKPHOS 60  BILITOT 1.0  PROT 6.3*  ALBUMIN 2.6*   No results for input(s): LIPASE, AMYLASE in the last 168 hours. No results for input(s): AMMONIA in the  last 168 hours. CBC: Recent Labs  Lab 07/19/17 2248 07/20/17 0413 07/21/17 0335 07/22/17 0354  WBC 2.8* 2.7* 3.9* 6.3  NEUTROABS 2.2  --  2.7 4.1  HGB 7.9* 8.4* 7.3* 9.1*  HCT 26.4* 27.4* 24.1* 29.3*  MCV 73.3* 72.1* 72.8* 74.2*  PLT 185 191 193 239   Cardiac Enzymes: No results for input(s): CKTOTAL, CKMB, CKMBINDEX, TROPONINI in the last 168 hours. BNP: Invalid input(s): POCBNP CBG: No results for input(s): GLUCAP in the last 168 hours. D-Dimer No results for input(s): DDIMER in the last 72 hours. Hgb A1c No results for input(s): HGBA1C in the last 72 hours. Lipid Profile No results for input(s): CHOL, HDL, LDLCALC, TRIG, CHOLHDL, LDLDIRECT in the last 72 hours. Thyroid function studies No results for input(s): TSH, T4TOTAL, T3FREE, THYROIDAB in the last 72 hours.  Invalid input(s): FREET3 Anemia work up Recent Labs    07/20/17 2007  VITAMINB12 170*  FOLATE 8.2  FERRITIN 69  TIBC 284  IRON 19*  RETICCTPCT 1.1   Urinalysis    Component Value Date/Time   COLORURINE YELLOW 07/19/2017 2341   APPEARANCEUR CLEAR 07/19/2017 2341   LABSPEC 1.010 07/19/2017 2341   PHURINE 6.0 07/19/2017 2341   GLUCOSEU NEGATIVE 07/19/2017 2341   HGBUR MODERATE (A) 07/19/2017 2341   BILIRUBINUR NEGATIVE 07/19/2017 2341   KETONESUR NEGATIVE 07/19/2017 2341   PROTEINUR NEGATIVE 07/19/2017 2341   UROBILINOGEN 1.0 10/22/2013 1100   NITRITE NEGATIVE 07/19/2017 2341   LEUKOCYTESUR NEGATIVE 07/19/2017 2341   Sepsis Labs Invalid input(s): PROCALCITONIN,   WBC,  LACTICIDVEN Microbiology Recent Results (from the past 240 hour(s))  Urine culture     Status: Abnormal   Collection Time: 07/19/17 11:41 PM  Result Value Ref Range Status   Specimen Description URINE, RANDOM  Final   Special Requests NONE  Final   Culture (A)  Final    <10,000 COLONIES/mL INSIGNIFICANT GROWTH Performed at [facility], [address] [place], [place] [address]    Report Status 07/21/2017 FINAL  Final  Culture, blood (x 2)     Status: None (Preliminary result)   Collection Time: 07/20/17  4:00 AM  Result Value Ref Range Status   Specimen Description BLOOD LEFT ANTECUBITAL  Final   Special Requests   Final    BOTTLES DRAWN AEROBIC AND ANAEROBIC Blood Culture adequate volume   Culture   Final    NO GROWTH 2 DAYS Performed at [facility], [address], [place], [place] [address]    Report Status PENDING  Incomplete  Culture, blood (x 2)     Status: None (Preliminary result)   Collection Time: 07/20/17  4:30 AM  Result Value Ref Range Status   Specimen Description BLOOD RIGHT THUMB  Final   Special Requests   Final    BOTTLES DRAWN AEROBIC ONLY Blood Culture results may not be optimal due to an inadequate volume of blood received in culture bottles   Culture   Final    NO GROWTH 2 DAYS Performed at [facility], [address], [place], [place] [address]    Report Status PENDING  Incomplete     Time coordinating discharge:  I have spent 35 minutes face to face with the patient and on the ward discussing the patients care, assessment, plan and disposition with other care givers. >50% of the time was devoted counseling the patient about the risks and benefits of treatment/Discharge disposition and coordinating care.   SIGNED:   Janey Genta  Joline Maxcyhirag Thersa Mohiuddin, MD  [facility] 07/23/2017, 10:08 AM Pager   If 7PM-7AM, please contact night-coverage [email or web address] Password [email or web address]

## 2017-07-25 LAB — CULTURE, BLOOD (ROUTINE X 2)
Culture: NO GROWTH
Culture: NO GROWTH
SPECIAL REQUESTS: ADEQUATE

## 2017-09-01 ENCOUNTER — Other Ambulatory Visit: Payer: Self-pay | Admitting: Family Medicine

## 2017-09-01 DIAGNOSIS — I10 Essential (primary) hypertension: Secondary | ICD-10-CM

## 2017-09-02 ENCOUNTER — Other Ambulatory Visit: Payer: Self-pay | Admitting: Family Medicine

## 2017-09-02 DIAGNOSIS — I10 Essential (primary) hypertension: Secondary | ICD-10-CM

## 2017-09-10 ENCOUNTER — Inpatient Hospital Stay (HOSPITAL_COMMUNITY): Payer: Medicaid Other

## 2017-09-10 ENCOUNTER — Other Ambulatory Visit: Payer: Self-pay

## 2017-09-10 ENCOUNTER — Emergency Department (HOSPITAL_COMMUNITY): Payer: Medicaid Other

## 2017-09-10 ENCOUNTER — Encounter (HOSPITAL_COMMUNITY): Payer: Self-pay | Admitting: Emergency Medicine

## 2017-09-10 ENCOUNTER — Inpatient Hospital Stay (HOSPITAL_COMMUNITY)
Admission: EM | Admit: 2017-09-10 | Discharge: 2017-09-15 | DRG: 377 | Disposition: A | Discharge status: Home Health | Payer: Medicaid Other | Attending: Internal Medicine | Admitting: Internal Medicine

## 2017-09-10 DIAGNOSIS — N179 Acute kidney failure, unspecified: Secondary | ICD-10-CM | POA: Diagnosis present

## 2017-09-10 DIAGNOSIS — Z6826 Body mass index (BMI) 26.0-26.9, adult: Secondary | ICD-10-CM | POA: Diagnosis not present

## 2017-09-10 DIAGNOSIS — K219 Gastro-esophageal reflux disease without esophagitis: Secondary | ICD-10-CM | POA: Diagnosis present

## 2017-09-10 DIAGNOSIS — R188 Other ascites: Secondary | ICD-10-CM | POA: Diagnosis present

## 2017-09-10 DIAGNOSIS — E876 Hypokalemia: Secondary | ICD-10-CM | POA: Diagnosis present

## 2017-09-10 DIAGNOSIS — D61818 Other pancytopenia: Secondary | ICD-10-CM | POA: Diagnosis present

## 2017-09-10 DIAGNOSIS — R6510 Systemic inflammatory response syndrome (SIRS) of non-infectious origin without acute organ dysfunction: Secondary | ICD-10-CM

## 2017-09-10 DIAGNOSIS — R319 Hematuria, unspecified: Secondary | ICD-10-CM | POA: Diagnosis present

## 2017-09-10 DIAGNOSIS — N9489 Other specified conditions associated with female genital organs and menstrual cycle: Secondary | ICD-10-CM

## 2017-09-10 DIAGNOSIS — E860 Dehydration: Secondary | ICD-10-CM | POA: Diagnosis present

## 2017-09-10 DIAGNOSIS — N83201 Unspecified ovarian cyst, right side: Secondary | ICD-10-CM | POA: Diagnosis present

## 2017-09-10 DIAGNOSIS — R109 Unspecified abdominal pain: Secondary | ICD-10-CM | POA: Diagnosis present

## 2017-09-10 DIAGNOSIS — R1013 Epigastric pain: Secondary | ICD-10-CM | POA: Diagnosis not present

## 2017-09-10 DIAGNOSIS — N949 Unspecified condition associated with female genital organs and menstrual cycle: Secondary | ICD-10-CM | POA: Diagnosis not present

## 2017-09-10 DIAGNOSIS — N281 Cyst of kidney, acquired: Secondary | ICD-10-CM | POA: Diagnosis present

## 2017-09-10 DIAGNOSIS — Z8673 Personal history of transient ischemic attack (TIA), and cerebral infarction without residual deficits: Secondary | ICD-10-CM | POA: Diagnosis not present

## 2017-09-10 DIAGNOSIS — K2900 Acute gastritis without bleeding: Secondary | ICD-10-CM | POA: Diagnosis present

## 2017-09-10 DIAGNOSIS — D509 Iron deficiency anemia, unspecified: Secondary | ICD-10-CM | POA: Diagnosis present

## 2017-09-10 DIAGNOSIS — D649 Anemia, unspecified: Secondary | ICD-10-CM

## 2017-09-10 DIAGNOSIS — Z79899 Other long term (current) drug therapy: Secondary | ICD-10-CM | POA: Diagnosis not present

## 2017-09-10 DIAGNOSIS — K254 Chronic or unspecified gastric ulcer with hemorrhage: Secondary | ICD-10-CM | POA: Diagnosis present

## 2017-09-10 DIAGNOSIS — I10 Essential (primary) hypertension: Secondary | ICD-10-CM | POA: Diagnosis present

## 2017-09-10 DIAGNOSIS — Z791 Long term (current) use of non-steroidal anti-inflammatories (NSAID): Secondary | ICD-10-CM | POA: Diagnosis not present

## 2017-09-10 DIAGNOSIS — E43 Unspecified severe protein-calorie malnutrition: Secondary | ICD-10-CM | POA: Diagnosis present

## 2017-09-10 DIAGNOSIS — R748 Abnormal levels of other serum enzymes: Secondary | ICD-10-CM | POA: Diagnosis present

## 2017-09-10 DIAGNOSIS — M4856XD Collapsed vertebra, not elsewhere classified, lumbar region, subsequent encounter for fracture with routine healing: Secondary | ICD-10-CM

## 2017-09-10 DIAGNOSIS — R1010 Upper abdominal pain, unspecified: Secondary | ICD-10-CM | POA: Diagnosis not present

## 2017-09-10 DIAGNOSIS — D500 Iron deficiency anemia secondary to blood loss (chronic): Secondary | ICD-10-CM | POA: Diagnosis not present

## 2017-09-10 DIAGNOSIS — R531 Weakness: Secondary | ICD-10-CM

## 2017-09-10 DIAGNOSIS — Z888 Allergy status to other drugs, medicaments and biological substances status: Secondary | ICD-10-CM

## 2017-09-10 DIAGNOSIS — M25512 Pain in left shoulder: Secondary | ICD-10-CM | POA: Diagnosis present

## 2017-09-10 DIAGNOSIS — M2550 Pain in unspecified joint: Secondary | ICD-10-CM

## 2017-09-10 DIAGNOSIS — M4856XA Collapsed vertebra, not elsewhere classified, lumbar region, initial encounter for fracture: Secondary | ICD-10-CM | POA: Diagnosis present

## 2017-09-10 LAB — COMPREHENSIVE METABOLIC PANEL
ALBUMIN: 2.1 g/dL — AB (ref 3.5–5.0)
ALK PHOS: 79 U/L (ref 38–126)
ALT: 14 U/L (ref 0–44)
AST: 43 U/L — AB (ref 15–41)
Anion gap: 8 (ref 5–15)
BUN: 11 mg/dL (ref 8–23)
CALCIUM: 7.7 mg/dL — AB (ref 8.9–10.3)
CHLORIDE: 103 mmol/L (ref 98–111)
CO2: 24 mmol/L (ref 22–32)
Creatinine, Ser: 1.14 mg/dL — ABNORMAL HIGH (ref 0.44–1.00)
GFR calc Af Amer: 56 mL/min — ABNORMAL LOW (ref >60)
GFR calc non Af Amer: 48 mL/min — ABNORMAL LOW (ref >60)
GLUCOSE: 121 mg/dL — AB (ref 70–99)
Potassium: 3.1 mmol/L — ABNORMAL LOW (ref 3.5–5.1)
SODIUM: 135 mmol/L (ref 135–145)
Total Bilirubin: 0.8 mg/dL (ref 0.3–1.2)
Total Protein: 7 g/dL (ref 6.5–8.1)

## 2017-09-10 LAB — CBC
HCT: 24.2 % — ABNORMAL LOW (ref 36.0–46.0)
HEMATOCRIT: 20.9 % — AB (ref 36.0–46.0)
HEMOGLOBIN: 7.4 g/dL — AB (ref 12.0–15.0)
Hemoglobin: 6.4 g/dL — CL (ref 12.0–15.0)
MCH: 23.3 pg — ABNORMAL LOW (ref 26.0–34.0)
MCH: 23.4 pg — ABNORMAL LOW (ref 26.0–34.0)
MCHC: 30.6 g/dL (ref 30.0–36.0)
MCHC: 30.6 g/dL (ref 30.0–36.0)
MCV: 76.1 fL — AB (ref 78.0–100.0)
MCV: 76.3 fL — ABNORMAL LOW (ref 78.0–100.0)
PLATELETS: 129 10*3/uL — AB (ref 150–400)
Platelets: 141 10*3/uL — ABNORMAL LOW (ref 150–400)
RBC: 3.18 MIL/uL — ABNORMAL LOW (ref 3.87–5.11)
RBC: 2.74 MIL/uL — ABNORMAL LOW (ref 3.87–5.11)
RDW: 21 % — ABNORMAL HIGH (ref 11.5–15.5)
RDW: 21.1 % — AB (ref 11.5–15.5)
WBC: 3.8 10*3/uL — ABNORMAL LOW (ref 4.0–10.5)
WBC: 2.6 10*3/uL — AB (ref 4.0–10.5)

## 2017-09-10 LAB — PREPARE RBC (CROSSMATCH)

## 2017-09-10 LAB — SAVE SMEAR

## 2017-09-10 LAB — URINALYSIS, ROUTINE W REFLEX MICROSCOPIC
BILIRUBIN URINE: NEGATIVE
Glucose, UA: NEGATIVE mg/dL
KETONES UR: NEGATIVE mg/dL
Leukocytes, UA: NEGATIVE
Nitrite: NEGATIVE
PH: 6 (ref 5.0–8.0)
PROTEIN: NEGATIVE mg/dL
RBC / HPF: >50 RBC/hpf — ABNORMAL HIGH (ref 0–5)
Specific Gravity, Urine: 1.013 (ref 1.005–1.030)

## 2017-09-10 LAB — LIPASE, BLOOD: Lipase: 40 U/L (ref 11–51)

## 2017-09-10 LAB — MAGNESIUM: Magnesium: 1.8 mg/dL (ref 1.7–2.4)

## 2017-09-10 MED ORDER — ONDANSETRON HCL 4 MG PO TABS: 4.0000 mg | ORAL_TABLET | Freq: Four times a day (QID) | ORAL | Status: DC | PRN

## 2017-09-10 MED ORDER — SODIUM CHLORIDE 0.9 % IV SOLN
8.0000 mg/h | INTRAVENOUS | Status: AC
  Administered 2017-09-10 – 2017-09-13 (×7): 8 mg/h via INTRAVENOUS
  Filled 2017-09-10 (×12): qty 80

## 2017-09-10 MED ORDER — SODIUM CHLORIDE 0.9 % IV SOLN
INTRAVENOUS | Status: DC
  Administered 2017-09-10: 23:00:00 via INTRAVENOUS
  Filled 2017-09-10 (×3): qty 1000

## 2017-09-10 MED ORDER — HYDRALAZINE HCL 50 MG PO TABS
50.0000 mg | ORAL_TABLET | Freq: Three times a day (TID) | ORAL | Status: DC
  Administered 2017-09-10 – 2017-09-15 (×14): 50 mg via ORAL
  Filled 2017-09-10 (×14): qty 1

## 2017-09-10 MED ORDER — MORPHINE SULFATE (PF) 4 MG/ML IV SOLN: 1.0000 mg | INTRAVENOUS | Status: DC | PRN

## 2017-09-10 MED ORDER — ONDANSETRON HCL 4 MG/2ML IJ SOLN: 4.0000 mg | Freq: Four times a day (QID) | INTRAMUSCULAR | Status: DC | PRN

## 2017-09-10 MED ORDER — ACETAMINOPHEN 650 MG RE SUPP: 650.0000 mg | Freq: Four times a day (QID) | RECTAL | Status: DC | PRN

## 2017-09-10 MED ORDER — HYDROCODONE-ACETAMINOPHEN 5-325 MG PO TABS
1.0000 | ORAL_TABLET | ORAL | Status: DC | PRN
  Administered 2017-09-13: 1 via ORAL
  Administered 2017-09-14: 2 via ORAL
  Filled 2017-09-10: qty 2
  Filled 2017-09-10: qty 1

## 2017-09-10 MED ORDER — BISACODYL 10 MG RE SUPP: 10.0000 mg | Freq: Every day | RECTAL | Status: DC | PRN

## 2017-09-10 MED ORDER — SODIUM CHLORIDE 0.9 % IV BOLUS
1000.0000 mL | Freq: Once | INTRAVENOUS | Status: AC
  Administered 2017-09-10: 1000 mL via INTRAVENOUS

## 2017-09-10 MED ORDER — FAMOTIDINE IN NACL 20-0.9 MG/50ML-% IV SOLN
20.0000 mg | Freq: Two times a day (BID) | INTRAVENOUS | Status: DC
  Administered 2017-09-10: 20 mg via INTRAVENOUS
  Filled 2017-09-10 (×2): qty 50

## 2017-09-10 MED ORDER — ACETAMINOPHEN 325 MG PO TABS
650.0000 mg | ORAL_TABLET | Freq: Four times a day (QID) | ORAL | Status: DC | PRN
  Filled 2017-09-10: qty 2

## 2017-09-10 MED ORDER — SODIUM CHLORIDE 0.9 % IV SOLN
80.0000 mg | Freq: Once | INTRAVENOUS | Status: AC
  Administered 2017-09-10: 80 mg via INTRAVENOUS
  Filled 2017-09-10 (×3): qty 80

## 2017-09-10 MED ORDER — IOHEXOL 300 MG/ML  SOLN
100.0000 mL | Freq: Once | INTRAMUSCULAR | Status: AC | PRN
  Administered 2017-09-10: 100 mL via INTRAVENOUS

## 2017-09-10 MED ORDER — SENNOSIDES-DOCUSATE SODIUM 8.6-50 MG PO TABS: 1.0000 | ORAL_TABLET | Freq: Every evening | ORAL | Status: DC | PRN

## 2017-09-10 MED ORDER — MAGNESIUM SULFATE 2 GM/50ML IV SOLN
2.0000 g | Freq: Once | INTRAVENOUS | Status: AC
  Administered 2017-09-10: 2 g via INTRAVENOUS
  Filled 2017-09-10: qty 50

## 2017-09-10 MED ORDER — SODIUM CHLORIDE 0.9% IV SOLUTION
Freq: Once | INTRAVENOUS | Status: AC
  Administered 2017-09-10: 20:00:00 via INTRAVENOUS

## 2017-09-10 MED ORDER — PANTOPRAZOLE SODIUM 40 MG IV SOLR: 40.0000 mg | Freq: Two times a day (BID) | INTRAVENOUS | Status: DC

## 2017-09-10 MED ORDER — GABAPENTIN 100 MG PO CAPS
100.0000 mg | ORAL_CAPSULE | Freq: Two times a day (BID) | ORAL | Status: DC
  Administered 2017-09-10 – 2017-09-15 (×10): 100 mg via ORAL
  Filled 2017-09-10 (×10): qty 1

## 2017-09-10 NOTE — ED Provider Notes (Addendum)
[facility]   CSN: [id number] Arrival date & time: 09/10/17  1205     History   Chief Complaint Chief Complaint  Patient presents with  . Joint Swelling  . Abdominal Pain    HPI Michelle Boyd is a 67 y.o. female.  HPI  67 year old female with history of cholelithiasis, gallstone pancreatitis, iron deficiency anemia and work-up for polyarthralgia comes in with chief complaint of weakness, abdominal pain and left shoulder pain.  Translation service was utilized.  Patient's main complaint is abdominal pain.  Her pain started 2 days ago, it is located in the lower quadrants and there is associated bloody stool.  Patient denies any diarrhea, nausea, vomiting and she thinks that her pain has improved since this morning.  Patient is having weakness, but she denies any dizziness or near syncope.  No fevers, chills.  Patient is also complaining of left shoulder pain.  Pain has been going on for several days, and patient is having difficulty lifting her arm.  She denies any trauma.  Patient has had polyarthralgia in the past, and there is no clear diagnosis for her symptoms.   Past Medical History:  Diagnosis Date  . Arthritis   . Cholelithiasis   . Chronic knee pain   . Gallstone pancreatitis   . GERD (gastroesophageal reflux disease)   . Hypertension   . Memory loss     Patient Active Problem List   Diagnosis Date Noted  . Abdominal pain 09/10/2017  . Multiple joint pain 07/20/2017  . SIRS (systemic inflammatory response syndrome) (HCC) 07/20/2017  . GERD (gastroesophageal reflux disease) 07/20/2017  . Anemia   . De Quervain's disease (tenosynovitis) 06/19/2017  . Chronic pain of left knee 04/23/2017  . Chronic pain of right knee 04/23/2017  . Osteoarthritis 09/21/2016  . Memory loss 01/11/2016  . Dissection of carotid artery (HCC)   . Right sided weakness   . Hemiplegic migraine without status migrainosus, not  intractable   . Right facial numbness   . Right arm numbness   . Right leg numbness   . Systolic murmur   . Dizziness   . Transient cerebral ischemia 12/03/2015  . Bilateral chronic knee pain 11/11/2014  . AKI (acute kidney injury) (HCC) 10/22/2013  . Sepsis secondary to UTI (HCC) 10/22/2013  . Acute renal failure (HCC) 10/22/2013  . Pyelonephritis, acute 10/22/2013  . Iron (Fe) deficiency anemia 10/22/2013  . Elevated LFTs 10/22/2013  . Microcytic anemia 10/05/2013  . Cholecystitis, acute 10/04/2013  . Other pancytopenia (HCC) 10/03/2013  . Gallstone pancreatitis 10/02/2013  . Gallstones 09/29/2013  . Non-traumatic compression fracture of L3 & L4 lumbar vertebra 08/24/2013  . Right knee pain 11/07/2012  . Hypertension 05/26/2012  . Hypokalemia 05/26/2012    Past Surgical History:  Procedure Laterality Date  . CATARACT EXTRACTION  2016  . CHOLECYSTECTOMY N/A 10/04/2013   Procedure: LAPAROSCOPIC CHOLECYSTECTOMY WITH INTRAOPERATIVE CHOLANGIOGRAM;  Surgeon: Axel FillerArmando Ramirez, MD;  Location: [facility] OR;  Service: General;  Laterality: N/A;  . EXTERNAL EAR SURGERY  years ago   right mastoidectomy     OB History   None      Home Medications    Prior to Admission medications   Medication Sig Start Date End Date Taking? Authorizing Provider  gabapentin (NEURONTIN) 100 MG capsule Take 1 capsule (100 mg total) by mouth 2 (two) times daily. 06/19/17  Yes Kathryne HitchBlackman, Christopher Y, MD  hydrALAZINE (APRESOLINE) 50 MG tablet Take 1 tablet (50 mg total)  by mouth 3 (three) times daily. 05/15/17  Yes Hoy Register, MD  cetirizine (ZYRTEC) 10 MG tablet Take 1 tablet (10 mg total) by mouth daily. Patient not taking: Reported on 03/29/2017 02/22/17   Hoy Register, MD  ferrous sulfate 325 (65 FE) MG tablet Take 1 tablet (325 mg total) by mouth 2 (two) times daily with a meal. 07/23/17 08/22/17  Amin, Loura Halt, MD  hydrALAZINE (APRESOLINE) 50 MG tablet TAKE 1 TABLET BY MOUTH THREE TIMES DAILY Patient  not taking: Reported on 09/10/2017 09/03/17   Hoy Register, MD  lactulose (CHRONULAC) 10 GM/15ML solution Take 15 mLs (10 g total) by mouth 3 (three) times daily. Patient not taking: Reported on 09/21/2016 10/18/15   Hoy Register, MD  losartan-hydrochlorothiazide (HYZAAR) 100-25 MG tablet Take 1 tablet by mouth daily. Patient not taking: Reported on 07/20/2017 03/29/17   Hoy Register, MD  methylPREDNISolone (MEDROL) 4 MG tablet Medrol dose pack. Take as instructed Patient not taking: Reported on 07/20/2017 06/19/17   Kathryne Hitch, MD  naproxen (NAPROSYN) 500 MG tablet Take 1 tablet (500 mg total) by mouth 2 (two) times daily between meals as needed. Patient not taking: Reported on 07/20/2017 06/19/17   Kathryne Hitch, MD  traMADol (ULTRAM) 50 MG tablet Take 1 tablet (50 mg total) by mouth 3 (three) times daily as needed. Patient not taking: Reported on 07/20/2017 06/19/17   Kathryne Hitch, MD    Family History Family History  Problem Relation Age of Onset  . Diabetes Father     Social History Social History   Tobacco Use  . Smoking status: Never Smoker  . Smokeless tobacco: Never Used  Substance Use Topics  . Alcohol use: No  . Drug use: No     Allergies   Feosol [iron]   Review of Systems Review of Systems  Constitutional: Positive for activity change.  Gastrointestinal: Positive for abdominal pain.  Musculoskeletal: Positive for arthralgias and myalgias.  Skin: Negative for rash.  Allergic/Immunologic: Negative for immunocompromised state.  Hematological: Does not bruise/bleed easily.  All other systems reviewed and are negative.    Physical Exam Updated Vital Signs BP 121/62 (BP Location: Right Arm)   Pulse (!) 110   Temp 97.7 F (36.5 C) (Oral)   Resp 18   SpO2 96%   Physical Exam  Constitutional: She is oriented to person, place, and time. She appears well-developed and well-nourished.  HENT:  Head: Atraumatic.  Eyes: EOM are normal.    Neck: Neck supple.  Cardiovascular: Normal rate and regular rhythm.  Pulmonary/Chest: Effort normal. No respiratory distress.  Abdominal: Soft. There is generalized tenderness and tenderness in the right lower quadrant, periumbilical area, suprapubic area and left lower quadrant. There is no rebound and no guarding.  Neurological: She is alert and oriented to person, place, and time.  Skin: Skin is warm and dry.  Nursing note and vitals reviewed. Musculoskeletal: Patient has tenderness to palpation over the left AC joint.  She is able to forward flex, and internal and external rotation of the shoulder does not reveal any significant range of motion deficiency.  Patient is having tenderness with abduction.   ED Treatments / Results  Labs (all labs ordered are listed, but only abnormal results are displayed) Labs Reviewed  COMPREHENSIVE METABOLIC PANEL - Abnormal; Notable for the following components:      Result Value   Potassium 3.1 (*)    Glucose, Bld 121 (*)    Creatinine, Ser 1.14 (*)  Calcium 7.7 (*)    Albumin 2.1 (*)    AST 43 (*)    GFR calc non Af Amer 48 (*)    GFR calc Af Amer 56 (*)    All other components within normal limits  CBC - Abnormal; Notable for the following components:   WBC 3.8 (*)    RBC 3.18 (*)    Hemoglobin 7.4 (*)    HCT 24.2 (*)    MCV 76.1 (*)    MCH 23.3 (*)    RDW 21.0 (*)    Platelets 141 (*)    All other components within normal limits  URINALYSIS, ROUTINE W REFLEX MICROSCOPIC - Abnormal; Notable for the following components:   APPearance HAZY (*)    Hgb urine dipstick LARGE (*)    RBC / HPF >50 (*)    Bacteria, UA FEW (*)    All other components within normal limits  CBC - Abnormal; Notable for the following components:   WBC 2.6 (*)    RBC 2.74 (*)    Hemoglobin 6.4 (*)    HCT 20.9 (*)    MCV 76.3 (*)    MCH 23.4 (*)    RDW 21.1 (*)    Platelets 129 (*)    All other components within normal limits  LIPASE, BLOOD  MAGNESIUM   SAVE SMEAR  COMPREHENSIVE METABOLIC PANEL  CBC  CBC  TYPE AND SCREEN  PREPARE RBC (CROSSMATCH)    EKG None  Radiology Ct Abdomen Pelvis W Contrast  Result Date: 09/10/2017 CLINICAL DATA:  67 year old with abdominal pain.  Bloody stool. EXAM: CT ABDOMEN AND PELVIS WITH CONTRAST TECHNIQUE: Multidetector CT imaging of the abdomen and pelvis was performed using the standard protocol following bolus administration of intravenous contrast. CONTRAST:  OMNIPAQUE IOHEXOL 300 MG/ML  SOLN COMPARISON:  10/22/2013 and 10/02/2013 FINDINGS: Lower chest: There are several small nodular densities at the lung bases. However, these appear to be stable from the exam on 10/22/2013. There is volume loss in the left lower lobe. No large pleural effusions. Hepatobiliary: Decreased attenuation of the liver and this could be related to steatosis. Small amount of perihepatic fluid along the inferior aspect of the liver. Main portal venous system is patent. Gallbladder appears to be surgically absent. Pancreas: Unremarkable. No pancreatic ductal dilatation or surrounding inflammatory changes. Spleen: Normal in size without focal abnormality. Adrenals/Urinary Tract: Normal adrenal glands. Urinary bladder is unremarkable. Negative for hydronephrosis. Normal appearance of both kidneys. Probable cyst in the anterior mid left kidney. Stomach/Bowel: Normal appearance of the stomach and duodenum. The rectum and colon are decompressed and difficult to evaluate for wall thickening. There is no significant bowel dilatation. No focal bowel inflammation. Vascular/Lymphatic: Atherosclerotic disease in the abdominal aorta without aneurysm. The main visceral arteries are patent. No significant lymph node enlargement in the abdomen or pelvis. Reproductive: There is a 3.9 x 3.1 cm round low-density structure associated with the right adnexa. Previously, there was a 2.3 cm low-density structure in this area. No gross abnormality to the  left adnexa. Normal appearance of the uterus. Other: Trace free fluid in the pelvis.  Negative for free air. Musculoskeletal: No acute bone abnormality. IMPRESSION: Small amount of ascites in the abdomen and pelvis but no focal inflammation within the abdomen or pelvis. **An incidental finding of potential clinical significance has been found. Enlarging low-density structure that appears to be associated with the right adnexa. This structure measures up to 3.9 cm and may represent an adnexal cyst. Recommend  follow-up pelvic ultrasound to further characterize the structure.** Electronically Signed   By: Richarda Overlie M.D.   On: 09/10/2017 18:51   Dg Shoulder Left  Result Date: 09/10/2017 CLINICAL DATA:  Left shoulder pain, no injury EXAM: LEFT SHOULDER - 2+ VIEW COMPARISON:  None. FINDINGS: There is no evidence of fracture or dislocation. There is no evidence of arthropathy or other focal bone abnormality. Soft tissues are unremarkable. IMPRESSION: Negative. Electronically Signed   By: Marlan Palau M.D.   On: 09/10/2017 16:40    Procedures Procedures (including critical care time)  CRITICAL CARE Performed by: Buffie Herne   Total critical care time: 56 minutes  Critical care time was exclusive of separately billable procedures and treating other patients.  Critical care was necessary to treat or prevent imminent or life-threatening deterioration.  Critical care was time spent personally by me on the following activities: development of treatment plan with patient and/or surrogate as well as nursing, discussions with consultants, evaluation of patient's response to treatment, examination of patient, obtaining history from patient or surrogate, ordering and performing treatments and interventions, ordering and review of laboratory studies, ordering and review of radiographic studies, pulse oximetry and re-evaluation of patient's condition.   Medications Ordered in ED Medications  sodium  chloride 0.9 % bolus 1,000 mL (1,000 mLs Intravenous New Bag/Given 09/10/17 1832)  magnesium sulfate IVPB 2 g 50 mL (has no administration in time range)  sodium chloride 0.9 % 1,000 mL with potassium chloride 60 mEq infusion (has no administration in time range)  gabapentin (NEURONTIN) capsule 100 mg (has no administration in time range)  hydrALAZINE (APRESOLINE) tablet 50 mg (has no administration in time range)  acetaminophen (TYLENOL) tablet 650 mg (has no administration in time range)    Or  acetaminophen (TYLENOL) suppository 650 mg (has no administration in time range)  HYDROcodone-acetaminophen (NORCO/VICODIN) 5-325 MG per tablet 1-2 tablet (has no administration in time range)  senna-docusate (Senokot-S) tablet 1 tablet (has no administration in time range)  bisacodyl (DULCOLAX) suppository 10 mg (has no administration in time range)  ondansetron (ZOFRAN) tablet 4 mg (has no administration in time range)    Or  ondansetron (ZOFRAN) injection 4 mg (has no administration in time range)  morphine 4 MG/ML injection 1 mg (has no administration in time range)  famotidine (PEPCID) IVPB 20 mg premix (has no administration in time range)  0.9 %  sodium chloride infusion (Manually program via Guardrails IV Fluids) (has no administration in time range)  pantoprazole (PROTONIX) 80 mg in sodium chloride 0.9 % 100 mL IVPB (has no administration in time range)  pantoprazole (PROTONIX) 80 mg in sodium chloride 0.9 % 250 mL (0.32 mg/mL) infusion (has no administration in time range)  iohexol (OMNIPAQUE) 300 MG/ML solution 100 mL (100 mLs Intravenous Contrast Given 09/10/17 1811)     Initial Impression / Assessment and Plan / ED Course  I have reviewed the triage vital signs and the nursing notes.  Pertinent labs & imaging results that were available during my care of the patient were reviewed by me and considered in my medical decision making (see chart for details).  Clinical Course as of Sep 11 1931  Tue Sep 10, 2017  1736 Slightly elevated compared to baseline..   Creatinine(!): 1.14 [AN]  1736 Of unknown significance.  Patient does not have UTI-like symptoms clinically.   RBC / HPF(!): >50 [AN]  1737 Platelet count is lower than earlier. Patient is noted to have pancytopenia.  She has  had lower hemoglobin and white count in the past.  Something the admitting team might have to further look into.  Platelets(!): 141 [AN]  1914 Patient's hemoglobin has dropped further.  Hospitalist service is ordering Protonix drip.  I have consulted GI, awaiting response.  BUN is normal, making it less likely for this bleed to be upper GI nature.   [AN]  1914 Hospitalist team also ordering blood.  Hemoglobin(!!): 6.4 [AN]  1933 Dr. Bosie Clos, GI will follow the patient.   [AN]    Clinical Course User Index [AN] Derwood Kaplan, MD   67 year old female comes in a chief complaint of weakness, abdominal pain and bloody stools.  Her hemoglobin today is noted to be 7.6.  She has had lower hemoglobin in the past, which got better with iron infusion.  We will not transfuse the patient in the ED, however there is clinical concern for symptomatic anemia as the underlying etiology for the weakness.  More importantly however, I think the underlying etiology for the anemia at this visit is blood loss anemia and not iron deficiency anemia.  Patient likely would benefit with endoscopy upper and lower, to further delineate the underlying process.  She also has abdominal discomfort, will get a CT scan abdomen pelvis to ensure there is no tumor.  Based on the exam there is no clinical concern for any acute surgical process.  As far as the left shoulder is concerned, we are not concerned about septic joint.  X-rays have been ordered, but doubt that there will be clinical significance noted.  I spoke with Dr. Susie Cassette, hospitalist medicine.  She will graciously accept the patient.  CT abdomen and pelvis is pending,  however she will start working on the admission at this time which is appreciated.   Final Clinical Impressions(s) / ED Diagnoses   Final diagnoses:  Severe protein-energy malnutrition (HCC)  Generalized weakness  Acute anemia  Elevated creatine kinase  Pancytopenia [facility])    ED Discharge Orders    None       Derwood Kaplan, MD 09/10/17 1738      Derwood Kaplan, MD 09/10/17 1933

## 2017-09-10 NOTE — ED Triage Notes (Signed)
Translator/son stated, she has stomach pain a little bit of blood in the stool. Started 3 days ago

## 2017-09-10 NOTE — H&P (Signed)
History and Physical    Michelle Boyd AJO:[id number] DOB: [birth year] DOA: 09/10/2017   PCP: Charlott Rakes, MD   Patient coming from:  Home    Chief Complaint: Abdominal pain   HPI: Michelle Boyd is a 67 y.o. [place] female with medical history significant for osteoarthritis, TIA, GERD, pancytopenia, history of essentially hypertension, prior history of TIA, and recent admission for symptomatic anemia, as well as joint pain and swelling to be secondary to inflammatory versus infectious arthritis, resolved with oral Solu-Medrol.  During admission, the patient had iron studies performed, but no formal GI work-up.  She was started on iron and B12 supplement prior to discharge, but she could not tolerate the iron pills, which cause itching.  Since then, her overall status has not improved, and today, she presented with increasing lower abdominal pain, generalized weakness.  She denies any fever, chills or night sweats.  She denies any shortness of breath, chest pain or palpitations.  She does get easily fatigued.  She does not have her periods anymore.  She has mild pica, with ice chip craving, and drinks a moderate amount of regular tea.  She denies any family history of sickle cell disease or trait.  She denies any gum, nosebleeding, hematuria or hematochezia, or hematemesis.  She denies any recent surgeries.  She denies any tobacco, alcohol or recreational drug use.  She has never seen a hematologist, or had a bone marrow biopsy.  No new medications, but she does admit to taking moderate amount of NSAIDs for pain.  No recent long distance trips.  Denies any unilateral weakness or numbness.  No confusion is reported.   ED Course:  BP 121/62 (BP Location: Right Arm)   Pulse (!) 110   Temp 97.7 F (36.5 C) (Oral)   Resp 18   SpO2 96%   White count 3.8, hemoglobin 7.4, with similar values from admission 07/21/2017 at 7.3, requiring transfusion, discharged on 07/22/2017 with hemoglobin of 9.1 Platelets  141 Creatinine 1.14, up from 0.72 in June 2019 CT of the abdomen and pelvis are pending Glucose 121  Review of Systems:  As per HPI otherwise all other systems reviewed and are negative  Past Medical History:  Diagnosis Date  . Arthritis   . Cholelithiasis   . Chronic knee pain   . Gallstone pancreatitis   . GERD (gastroesophageal reflux disease)   . Hypertension   . Memory loss     Past Surgical History:  Procedure Laterality Date  . CATARACT EXTRACTION  2016  . CHOLECYSTECTOMY N/A 10/04/2013   Procedure: LAPAROSCOPIC CHOLECYSTECTOMY WITH INTRAOPERATIVE CHOLANGIOGRAM;  Surgeon: Ralene Ok, MD;  Location: [facility];  Service: General;  Laterality: N/A;  . EXTERNAL EAR SURGERY  years ago   right mastoidectomy    Social History Social History   Socioeconomic History  . Marital status: Divorced    Spouse name: Not on file  . Number of children: Not on file  . Years of education: ESL  . Highest education level: Not on file  Occupational History    Comment: na  Social Needs  . Financial resource strain: Not on file  . Food insecurity:    Worry: Not on file    Inability: Not on file  . Transportation needs:    Medical: Not on file    Non-medical: Not on file  Tobacco Use  . Smoking status: Never Smoker  . Smokeless tobacco: Never Used  Substance and Sexual Activity  . Alcohol use: No  . Drug  use: No  . Sexual activity: Never    Birth control/protection: Abstinence  Lifestyle  . Physical activity:    Days per week: Not on file    Minutes per session: Not on file  . Stress: Not on file  Relationships  . Social connections:    Talks on phone: Not on file    Gets together: Not on file    Attends religious service: Not on file    Active member of club or organization: Not on file    Attends meetings of clubs or organizations: Not on file    Relationship status: Not on file  . Intimate partner violence:    Fear of current or ex partner: Not on file     Emotionally abused: Not on file    Physically abused: Not on file    Forced sexual activity: Not on file  Other Topics Concern  . Not on file  Social History Narrative   Lives at home with son, daughter-in-law   Caffeine use- coffee 3-4 cups daily, tea occas     Allergies  Allergen Reactions  . Feosol [Iron] Itching    Family History  Problem Relation Age of Onset  . Diabetes Father        Prior to Admission medications   Medication Sig Start Date End Date Taking? Authorizing Provider  gabapentin (NEURONTIN) 100 MG capsule Take 1 capsule (100 mg total) by mouth 2 (two) times daily. 06/19/17  Yes Mcarthur Rossetti, MD  hydrALAZINE (APRESOLINE) 50 MG tablet Take 1 tablet (50 mg total) by mouth 3 (three) times daily. 05/15/17  Yes Charlott Rakes, MD  cetirizine (ZYRTEC) 10 MG tablet Take 1 tablet (10 mg total) by mouth daily. Patient not taking: Reported on 03/29/2017 02/22/17   Charlott Rakes, MD  ferrous sulfate 325 (65 FE) MG tablet Take 1 tablet (325 mg total) by mouth 2 (two) times daily with a meal. 07/23/17 08/22/17  Amin, Jeanella Flattery, MD  hydrALAZINE (APRESOLINE) 50 MG tablet TAKE 1 TABLET BY MOUTH THREE TIMES DAILY Patient not taking: Reported on 09/10/2017 09/03/17   Charlott Rakes, MD  lactulose (CHRONULAC) 10 GM/15ML solution Take 15 mLs (10 g total) by mouth 3 (three) times daily. Patient not taking: Reported on 09/21/2016 10/18/15   Charlott Rakes, MD  losartan-hydrochlorothiazide (HYZAAR) 100-25 MG tablet Take 1 tablet by mouth daily. Patient not taking: Reported on 07/20/2017 03/29/17   Charlott Rakes, MD  methylPREDNISolone (MEDROL) 4 MG tablet Medrol dose pack. Take as instructed Patient not taking: Reported on 07/20/2017 06/19/17   Mcarthur Rossetti, MD  naproxen (NAPROSYN) 500 MG tablet Take 1 tablet (500 mg total) by mouth 2 (two) times daily between meals as needed. Patient not taking: Reported on 07/20/2017 06/19/17   Mcarthur Rossetti, MD  traMADol (ULTRAM) 50  MG tablet Take 1 tablet (50 mg total) by mouth 3 (three) times daily as needed. Patient not taking: Reported on 07/20/2017 06/19/17   Mcarthur Rossetti, MD     Physical Exam:  Vitals:   09/10/17 1217  BP: 121/62  Pulse: (!) 110  Resp: 18  Temp: 97.7 F (36.5 C)  TempSrc: Oral  SpO2: 96%   Constitutional: NAD, calm, appears uncomfortable, looks younger than her stated age. Eyes: PERRL, lids and conjunctivae normal ENMT: Mucous membranes are moist, without exudate or lesions  Neck: normal, supple,no thyromegaly Respiratory: clear to auscultation bilaterally, no wheezing, no crackles. Normal respiratory effort  Cardiovascular: Regular rate and rhythm,  murmur, rubs or  gallops. No extremity edema. 2+ pedal pulses. No carotid bruits.  Abdomen: Soft, tender to palpation in the lower pelvis, and above the suprapubic region.,  No discrete masses palpated no hepatosplenomegaly. Bowel sounds positive.  Musculoskeletal: no clubbing / cyanosis. Moves all extremities Skin: no jaundice, No lesions.  Neurologic: Sensation intact  Strength equal in all extremities Psychiatric:   Alert and oriented x 3. Normal mood.     Labs on Admission: I have personally reviewed following labs and imaging studies  CBC: Recent Labs  Lab 09/10/17 1228  WBC 3.8*  HGB 7.4*  HCT 24.2*  MCV 76.1*  PLT 141*    Basic Metabolic Panel: Recent Labs  Lab 09/10/17 1228  NA 135  K 3.1*  CL 103  CO2 24  GLUCOSE 121*  BUN 11  CREATININE 1.14*  CALCIUM 7.7*    GFR: CrCl cannot be calculated (Unknown ideal weight.).  Liver Function Tests: Recent Labs  Lab 09/10/17 1228  AST 43*  ALT 14  ALKPHOS 79  BILITOT 0.8  PROT 7.0  ALBUMIN 2.1*   Recent Labs  Lab 09/10/17 1228  LIPASE 40   No results for input(s): AMMONIA in the last 168 hours.  Coagulation Profile: No results for input(s): INR, PROTIME in the last 168 hours.  Cardiac Enzymes: No results for input(s): CKTOTAL, CKMB,  CKMBINDEX, TROPONINI in the last 168 hours.  BNP (last 3 results) No results for input(s): PROBNP in the last 8760 hours.  HbA1C: No results for input(s): HGBA1C in the last 72 hours.  CBG: No results for input(s): GLUCAP in the last 168 hours.  Lipid Profile: No results for input(s): CHOL, HDL, LDLCALC, TRIG, CHOLHDL, LDLDIRECT in the last 72 hours.  Thyroid Function Tests: No results for input(s): TSH, T4TOTAL, FREET4, T3FREE, THYROIDAB in the last 72 hours.  Anemia Panel: No results for input(s): VITAMINB12, FOLATE, FERRITIN, TIBC, IRON, RETICCTPCT in the last 72 hours.  Urine analysis:    Component Value Date/Time   COLORURINE YELLOW 09/10/2017 1228   APPEARANCEUR HAZY (A) 09/10/2017 1228   LABSPEC 1.013 09/10/2017 1228   PHURINE 6.0 09/10/2017 1228   GLUCOSEU NEGATIVE 09/10/2017 1228   HGBUR LARGE (A) 09/10/2017 1228   BILIRUBINUR NEGATIVE 09/10/2017 1228   KETONESUR NEGATIVE 09/10/2017 1228   PROTEINUR NEGATIVE 09/10/2017 1228   UROBILINOGEN 1.0 10/22/2013 1100   NITRITE NEGATIVE 09/10/2017 1228   LEUKOCYTESUR NEGATIVE 09/10/2017 1228    Sepsis Labs: _0 (procalcitonin:4,lacticidven:4) )No results found for this or any previous visit (from the past 240 hour(s)).   Radiological Exams on Admission: Dg Shoulder Left  Result Date: 09/10/2017 CLINICAL DATA:  Left shoulder pain, no injury EXAM: LEFT SHOULDER - 2+ VIEW COMPARISON:  None. FINDINGS: There is no evidence of fracture or dislocation. There is no evidence of arthropathy or other focal bone abnormality. Soft tissues are unremarkable. IMPRESSION: Negative. Electronically Signed   By: Franchot Gallo M.D.   On: 09/10/2017 16:40    EKG: Independently reviewed.  Assessment/Plan Principal Problem:   Anemia Active Problems:   Hypertension   Non-traumatic compression fracture of L3 & L4 lumbar vertebra   Iron (Fe) deficiency anemia   Multiple joint pain   GERD (gastroesophageal reflux disease)     Lower abdominal pain, rule out GI bleed.  Patient never had a GI evaluation.   Admit to Tele IP  CT abdomen and pelvis rule out occult malignancy   IV Pepcid 20 mg bid  IVF at 75 cc/h   NPO  in case  endoscopy warranted today GI consult appreciated  Hold NSAIDs  Pain medications with morphine, oxycodone as needed, Tylenol.    Symptomatic anemia, rule out GI bleed as mentioned above, versus other areas of bleeding.  UA shows large blood, and abdominal exam shows tenderness in the lower abdomen and in the suprapubic region Recent admission for symptomatic anemia, hemoglobin at the time was 7.3, discharge in the9 s now with a current hemoglobin of 7.4.  Last iron studies in 07/20/2017 showed iron of 19, ferritin 69, saturation 7, B12 170.  However, the patient has been unable to tolerate iron Will await for the results of the CT of the abdomen and pelvis to rule out occult malignancy  Will type and screen, check serial CBCs, and transfuse for Hgb less than 7  Save smear Hold NSAIDs Patient may need hematology evaluation, to determine if Feraheme may be an option, in view of low tolerance to iron, especially if no malignancy is noted.  Hypokalemia Current K  3.1  Received 60 mEq of K with IV fluids, as well as magnesium Repeat CMET in am   Hypertension BP 121/62  Pulse 110   Continue home anti-hypertensive medications, hold for BP 100/50  Acute Kidney Injury likely due to dehydration, current creatinine 1.14, Lab Results  Component Value Date   CREATININE 1.14 (H) 09/10/2017   CREATININE 0.72 07/21/2017   CREATININE 0.67 07/20/2017  IVF Chemistries in a.m.      DVT prophylaxis: SCD Code Status:    Full code Family Communication:  Discussed with patient Disposition Plan: Expect patient to be discharged to home after condition improves Consults called:    GI Admission status: MedSurg inpatient   Sharene Butters, PA-C [facility]   Amion text  [id number]   09/10/2017,  4:50 PM

## 2017-09-10 NOTE — ED Notes (Signed)
Pt returned from CT scan with grandson, daughter at bedside.  Labs drawn and sent, IVF hung for bolus.

## 2017-09-11 ENCOUNTER — Inpatient Hospital Stay (HOSPITAL_COMMUNITY): Payer: Medicaid Other

## 2017-09-11 DIAGNOSIS — R1010 Upper abdominal pain, unspecified: Secondary | ICD-10-CM

## 2017-09-11 DIAGNOSIS — N949 Unspecified condition associated with female genital organs and menstrual cycle: Secondary | ICD-10-CM

## 2017-09-11 DIAGNOSIS — R748 Abnormal levels of other serum enzymes: Secondary | ICD-10-CM

## 2017-09-11 LAB — COMPREHENSIVE METABOLIC PANEL
ALBUMIN: 1.8 g/dL — AB (ref 3.5–5.0)
ALT: 12 U/L (ref 0–44)
AST: 34 U/L (ref 15–41)
Alkaline Phosphatase: 71 U/L (ref 38–126)
Anion gap: 5 (ref 5–15)
BUN: 11 mg/dL (ref 8–23)
CALCIUM: 7.1 mg/dL — AB (ref 8.9–10.3)
CHLORIDE: 106 mmol/L (ref 98–111)
CO2: 23 mmol/L (ref 22–32)
CREATININE: 1.02 mg/dL — AB (ref 0.44–1.00)
GFR calc Af Amer: >60 mL/min (ref >60)
GFR, EST NON AFRICAN AMERICAN: 55 mL/min — AB (ref >60)
Glucose, Bld: 146 mg/dL — ABNORMAL HIGH (ref 70–99)
POTASSIUM: 3.1 mmol/L — AB (ref 3.5–5.1)
SODIUM: 134 mmol/L — AB (ref 135–145)
Total Bilirubin: 1 mg/dL (ref 0.3–1.2)
Total Protein: 6.1 g/dL — ABNORMAL LOW (ref 6.5–8.1)

## 2017-09-11 LAB — CBC
HCT: 25.2 % — ABNORMAL LOW (ref 36.0–46.0)
HCT: 30.2 % — ABNORMAL LOW (ref 36.0–46.0)
HCT: 32 % — ABNORMAL LOW (ref 36.0–46.0)
HCT: 29.9 % — ABNORMAL LOW (ref 36.0–46.0)
Hemoglobin: 7.8 g/dL — ABNORMAL LOW (ref 12.0–15.0)
Hemoglobin: 9.7 g/dL — ABNORMAL LOW (ref 12.0–15.0)
Hemoglobin: 10.1 g/dL — ABNORMAL LOW (ref 12.0–15.0)
Hemoglobin: 9.5 g/dL — ABNORMAL LOW (ref 12.0–15.0)
MCH: 24.7 pg — ABNORMAL LOW (ref 26.0–34.0)
MCH: 24.3 pg — ABNORMAL LOW (ref 26.0–34.0)
MCH: 24.4 pg — AB (ref 26.0–34.0)
MCH: 24.5 pg — ABNORMAL LOW (ref 26.0–34.0)
MCHC: 31 g/dL (ref 30.0–36.0)
MCHC: 32.1 g/dL (ref 30.0–36.0)
MCHC: 31.6 g/dL (ref 30.0–36.0)
MCHC: 31.8 g/dL (ref 30.0–36.0)
MCV: 78.8 fL (ref 78.0–100.0)
MCV: 77.3 fL — AB (ref 78.0–100.0)
MCV: 77.1 fL — ABNORMAL LOW (ref 78.0–100.0)
MCV: 76.8 fL — AB (ref 78.0–100.0)
PLATELETS: 127 10*3/uL — AB (ref 150–400)
PLATELETS: 129 10*3/uL — AB (ref 150–400)
PLATELETS: 123 10*3/uL — AB (ref 150–400)
Platelets: 132 10*3/uL — ABNORMAL LOW (ref 150–400)
RBC: 3.2 MIL/uL — ABNORMAL LOW (ref 3.87–5.11)
RBC: 3.93 MIL/uL (ref 3.87–5.11)
RBC: 4.15 MIL/uL (ref 3.87–5.11)
RBC: 3.87 MIL/uL (ref 3.87–5.11)
RDW: 20.6 % — AB (ref 11.5–15.5)
RDW: 20.5 % — ABNORMAL HIGH (ref 11.5–15.5)
RDW: 21.2 % — AB (ref 11.5–15.5)
RDW: 19.8 % — AB (ref 11.5–15.5)
WBC: 2.9 10*3/uL — ABNORMAL LOW (ref 4.0–10.5)
WBC: 2.7 10*3/uL — AB (ref 4.0–10.5)
WBC: 3.4 10*3/uL — ABNORMAL LOW (ref 4.0–10.5)
WBC: 2.8 10*3/uL — AB (ref 4.0–10.5)

## 2017-09-11 LAB — PROCALCITONIN

## 2017-09-11 LAB — DIFFERENTIAL
BASOS PCT: 0 %
Basophils Absolute: 0 10*3/uL (ref 0.0–0.1)
EOS ABS: 0.1 10*3/uL (ref 0.0–0.7)
EOS PCT: 2 %
LYMPHS ABS: 0.6 10*3/uL — AB (ref 0.7–4.0)
Lymphocytes Relative: 21 %
MONO ABS: 0.1 10*3/uL (ref 0.1–1.0)
Monocytes Relative: 5 %
NEUTROS ABS: 2 10*3/uL (ref 1.7–7.7)
Neutrophils Relative %: 72 %
Smear Review: ADEQUATE

## 2017-09-11 LAB — RETICULOCYTES
RBC.: 4.08 MIL/uL (ref 3.87–5.11)
RETIC COUNT ABSOLUTE: 77.5 10*3/uL (ref 19.0–186.0)
RETIC CT PCT: 1.9 % (ref 0.4–3.1)

## 2017-09-11 LAB — LACTATE DEHYDROGENASE: LDH: 367 U/L — AB (ref 98–192)

## 2017-09-11 LAB — SAVE SMEAR

## 2017-09-11 MED ORDER — POTASSIUM CHLORIDE CRYS ER 20 MEQ PO TBCR
40.0000 meq | EXTENDED_RELEASE_TABLET | Freq: Once | ORAL | Status: AC
  Administered 2017-09-11: 40 meq via ORAL
  Filled 2017-09-11: qty 2

## 2017-09-11 MED ORDER — ACETAMINOPHEN 325 MG PO TABS
650.0000 mg | ORAL_TABLET | ORAL | Status: AC
  Administered 2017-09-11: 650 mg via ORAL

## 2017-09-11 MED ORDER — SODIUM CHLORIDE 0.9 % IV SOLN
INTRAVENOUS | Status: DC
  Administered 2017-09-11 – 2017-09-15 (×8): via INTRAVENOUS

## 2017-09-11 NOTE — Progress Notes (Signed)
[facility]                                                                              Patient Demographics  Michelle Boyd, is a 67 y.o. female, DOB - Dec 05, 1950, QIO:[id number]  Admit date - 09/10/2017   Admitting Physician Richarda Overlie, MD  Outpatient Primary MD for the patient is Hoy Register, MD  Outpatient specialists:   LOS - 1  days   Medical records reviewed and are as summarized below:    Chief Complaint  Patient presents with  . Joint Swelling  . Abdominal Pain       Brief summary   Patient is a 68 year old female with osteoarthritis, TIA, GERD, pancytopenia, hypertension, recent admission for symptomatic anemia, joint pain secondary to inflammatory versus infectious arthritis with oral Solu-Medrol.  During the previous admission, patient was found to have anemia, started on iron and B12 supplements but she could not tolerate iron pills.  Patient presented with increasing lower abdominal pain, generalized weakness, mild pica.  Also taking moderate amount of NSAIDs for joint pains. Hemoglobin 6.4 at the time of admission  Assessment & Plan    Principal Problem: Symptomatic anemia/pancytopenia with leukopenia, thrombocytopenia -Unclear etiology, patient had a colonoscopy a week ago which showed few small distal polyps which were removed and subsequently patient had melena which has resolved -GI consulted, recommended EGD -Patient noted noted to have pancytopenia, LDH mildly elevated 367, reticulocyte count normal, obtain haptoglobin, smear -CT abdomen showed small amount of ascites in the abdomen but no focal abnormality, incidental right adnexal enlarging low-density structure 3.9 cm, may represent adnexal cyst -If GI work-up negative, will consult hematology -Continue clear liquid diet, n.p.o. after midnight  Active Problems: Right adnexal cyst -Incidental finding on CT abdomen, obtain pelvic ultrasound    Hypertension -Currently stable,  continue hydralazine  Hypokalemia -Replace  Acute kidney injury -Creatinine mildly elevated 1.1, improved to 1.0    Multiple joint pain -Currently no complaints, continue Neurontin     GERD (gastroesophageal reflux disease) -Continue PPI  Low-grade fever -Overnight, afebrile this morning, leukopenia -UA with hematuria but no UTI, CT abdomen showed normal kidneys, no hydronephrosis, bladder unremarkable -Obtain blood cultures x2, currently no signs of any infectious source -Hold antibiotics, obtain procalcitonin  Code Status full code DVT Prophylaxis:   SCD's Family Communication: Discussed in detail with the patient, all imaging results, lab results explained to the patient and daughter at the bedside who assisted with translation   Disposition Plan:   Time Spent in minutes   25 minutes  Procedures:  CT abdomen  Consultants:   Gastroenterology  Antimicrobials:      Medications  Scheduled Meds: . gabapentin  100 mg Oral BID  . hydrALAZINE  50 mg Oral TID   Continuous Infusions: . sodium chloride 75 mL/hr at 09/11/17 0904  . pantoprozole (PROTONIX) infusion 8 mg/hr (09/11/17 0830)   PRN Meds:.acetaminophen **OR** acetaminophen, bisacodyl, HYDROcodone-acetaminophen, morphine injection, ondansetron **OR** ondansetron (ZOFRAN) IV, senna-docusate   Antibiotics   Anti-infectives (From admission, onward)   None        Subjective:   Michelle Boyd was seen and examined today.  Low-grade temp overnight 100.5 F.  No fever this morning, denies any nausea vomiting, respiratory symptoms or any abdominal pain.  Objective:   Vitals:   09/11/17 0151 09/11/17 0227 09/11/17 0503 09/11/17 0756  BP: (!) 122/57 (!) 119/56 124/64 (!) 149/68  Pulse: 96 90 72 68  Resp: 18 18 18 20   Temp: (!) 100.5 F (38.1 C) 99.3 F (37.4 C) (!) 97.3 F (36.3 C) 97.7 F (36.5 C)  TempSrc: Oral Oral Oral Oral  SpO2: 95% 96% 95% 97%  Weight:        Intake/Output Summary (Last 24  hours) at 09/11/2017 1208 Last data filed at 09/11/2017 0600 Gross per 24 hour  Intake 2019.17 ml  Output 0 ml  Net 2019.17 ml     Wt Readings from Last 3 Encounters:  09/10/17 54.4 kg (119 lb 14.9 oz)  05/15/17 57.3 kg (126 lb 6.4 oz)  03/29/17 59.6 kg (131 lb 6.4 oz)     Exam  General: Alert and oriented x 3, NAD  Eyes:   HEENT:  Atraumatic, normocephalic  Cardiovascular: S1 S2 auscultated, Regular rate and rhythm.  Respiratory: Clear to auscultation bilaterally, no wheezing, rales or rhonchi  Gastrointestinal: Soft, mild diffuse tenderness, nondistended, + bowel sounds  Ext: no pedal edema bilaterally  Neuro: no specific complaints  Musculoskeletal: No digital cyanosis, clubbing  Skin: No rashes  Psych: Normal affect and demeanor, alert and oriented x3    Data Reviewed:  I have personally reviewed following labs and imaging studies  Micro Results No results found for this or any previous visit (from the past 240 hour(s)).  Radiology Reports Ct Abdomen Pelvis W Contrast  Result Date: 09/10/2017 CLINICAL DATA:  66 year old with abdominal pain.  Bloody stool. EXAM: CT ABDOMEN AND PELVIS WITH CONTRAST TECHNIQUE: Multidetector CT imaging of the abdomen and pelvis was performed using the standard protocol following bolus administration of intravenous contrast. CONTRAST:  OMNIPAQUE IOHEXOL 300 MG/ML  SOLN COMPARISON:  10/22/2013 and 10/02/2013 FINDINGS: Lower chest: There are several small nodular densities at the lung bases. However, these appear to be stable from the exam on 10/22/2013. There is volume loss in the left lower lobe. No large pleural effusions. Hepatobiliary: Decreased attenuation of the liver and this could be related to steatosis. Small amount of perihepatic fluid along the inferior aspect of the liver. Main portal venous system is patent. Gallbladder appears to be surgically absent. Pancreas: Unremarkable. No pancreatic ductal dilatation or  surrounding inflammatory changes. Spleen: Normal in size without focal abnormality. Adrenals/Urinary Tract: Normal adrenal glands. Urinary bladder is unremarkable. Negative for hydronephrosis. Normal appearance of both kidneys. Probable cyst in the anterior mid left kidney. Stomach/Bowel: Normal appearance of the stomach and duodenum. The rectum and colon are decompressed and difficult to evaluate for wall thickening. There is no significant bowel dilatation. No focal bowel inflammation. Vascular/Lymphatic: Atherosclerotic disease in the abdominal aorta without aneurysm. The main visceral arteries are patent. No significant lymph node enlargement in the abdomen or pelvis. Reproductive: There is a 3.9 x 3.1 cm round low-density structure associated with the right adnexa. Previously, there was a 2.3 cm low-density structure in this area. No gross abnormality to the left adnexa. Normal appearance of the uterus. Other: Trace free fluid in the pelvis.  Negative for free air. Musculoskeletal: No acute bone abnormality. IMPRESSION: Small amount of ascites in the abdomen and pelvis but no focal inflammation within the abdomen or pelvis. **An incidental finding of potential clinical significance has been found. Enlarging low-density  structure that appears to be associated with the right adnexa. This structure measures up to 3.9 cm and may represent an adnexal cyst. Recommend follow-up pelvic ultrasound to further characterize the structure.** Electronically Signed   By: Richarda OverlieAdam  Henn M.D.   On: 09/10/2017 18:51   Dg Shoulder Left  Result Date: 09/10/2017 CLINICAL DATA:  Left shoulder pain, no injury EXAM: LEFT SHOULDER - 2+ VIEW COMPARISON:  None. FINDINGS: There is no evidence of fracture or dislocation. There is no evidence of arthropathy or other focal bone abnormality. Soft tissues are unremarkable. IMPRESSION: Negative. Electronically Signed   By: Marlan Palauharles  Clark M.D.   On: 09/10/2017 16:40   [place] Pelvic Complete With  Transvaginal  Result Date: 09/11/2017 CLINICAL DATA:  Follow-up of right adnexal mass found on CT scan of July 23rd 2019 EXAM: TRANSABDOMINAL AND TRANSVAGINAL ULTRASOUND OF PELVIS TECHNIQUE: Both transabdominal and transvaginal ultrasound examinations of the pelvis were performed. Transabdominal technique was performed for global imaging of the pelvis including uterus, ovaries, adnexal regions, and pelvic cul-de-sac. It was necessary to proceed with endovaginal exam following the transabdominal exam to visualize the uterus, endometrium, ovaries, and adnexal structures. COMPARISON:  Abdominal and pelvic CT scan of September 10, 2017 FINDINGS: Uterus Measurements: 3.6 x 1.7 x 2.2 cm. No fibroids or other mass visualized. Endometrium Thickness: 3 mm.  No focal abnormality visualized. Right ovary Measurements: 4.3 x 3.2 x 4.3 cm. There is a 3.1 x 2.9 x 3.5 cm since simple appearing right ovarian cyst. There is no surrounding hypervascularity. Left ovary Measurements: The left ovary could not be visualized. Normal appearance/no adnexal mass. Other findings There is a small amount of free pelvic fluid. IMPRESSION: Simple appearing right ovarian cyst measuring 3.1 x 2.9 x 3.5 cm. No suspicious solid appearing adnexal mass on the right is observed. Follow-up ultrasound in 1 year is recommended. Nonvisualization of the left ovary. Normal appearance of the uterus and endometrium. Electronically Signed   By: David  SwazilandJordan M.D.   On: 09/11/2017 11:26    Lab Data:  CBC: Recent Labs  Lab 09/10/17 1228 09/10/17 1846 09/11/17 0055 09/11/17 0643  WBC 3.8* 2.6* 2.9* 2.8*  NEUTROABS  --   --   --  2.0  HGB 7.4* 6.4* 7.8* 9.7*  HCT 24.2* 20.9* 25.2* 30.2*  MCV 76.1* 76.3* 78.8 76.8*  PLT 141* 129* 129* 123*   Basic Metabolic Panel: Recent Labs  Lab 09/10/17 1228 09/10/17 1846 09/11/17 0055  NA 135  --  134*  K 3.1*  --  3.1*  CL 103  --  106  CO2 24  --  23  GLUCOSE 121*  --  146*  BUN 11  --  11  CREATININE  1.14*  --  1.02*  CALCIUM 7.7*  --  7.1*  MG  --  1.8  --    GFR: Estimated Creatinine Clearance: 39.2 mL/min (A) (by C-G formula based on SCr of 1.02 mg/dL (H)). Liver Function Tests: Recent Labs  Lab 09/10/17 1228 09/11/17 0055  AST 43* 34  ALT 14 12  ALKPHOS 79 71  BILITOT 0.8 1.0  PROT 7.0 6.1*  ALBUMIN 2.1* 1.8*   Recent Labs  Lab 09/10/17 1228  LIPASE 40   No results for input(s): AMMONIA in the last 168 hours. Coagulation Profile: No results for input(s): INR, PROTIME in the last 168 hours. Cardiac Enzymes: No results for input(s): CKTOTAL, CKMB, CKMBINDEX, TROPONINI in the last 168 hours. BNP (last 3 results) No results for input(s): PROBNP  in the last 8760 hours. HbA1C: No results for input(s): HGBA1C in the last 72 hours. CBG: No results for input(s): GLUCAP in the last 168 hours. Lipid Profile: No results for input(s): CHOL, HDL, LDLCALC, TRIG, CHOLHDL, LDLDIRECT in the last 72 hours. Thyroid Function Tests: No results for input(s): TSH, T4TOTAL, FREET4, T3FREE, THYROIDAB in the last 72 hours. Anemia Panel: Recent Labs    09/11/17 0709  RETICCTPCT 1.9   Urine analysis:    Component Value Date/Time   COLORURINE YELLOW 09/10/2017 1228   APPEARANCEUR HAZY (A) 09/10/2017 1228   LABSPEC 1.013 09/10/2017 1228   PHURINE 6.0 09/10/2017 1228   GLUCOSEU NEGATIVE 09/10/2017 1228   HGBUR LARGE (A) 09/10/2017 1228   BILIRUBINUR NEGATIVE 09/10/2017 1228   KETONESUR NEGATIVE 09/10/2017 1228   PROTEINUR NEGATIVE 09/10/2017 1228   UROBILINOGEN 1.0 10/22/2013 1100   NITRITE NEGATIVE 09/10/2017 1228   LEUKOCYTESUR NEGATIVE 09/10/2017 1228     Jantzen Pilger M.D. [facility] 09/11/2017, 12:08 PM  Pager: [phone] Between 7am to 7pm - call Pager - [phone]  After 7pm go to [email or web address] - password [email or web address]  Call night coverage person covering after 7pm

## 2017-09-11 NOTE — Evaluation (Signed)
Occupational Therapy Evaluation Patient Details Name: Michelle Boyd MRN: [id number] DOB: 04/03/1950 Today's Date: 09/11/2017    History of Present Illness 67 y.o. [place] female with medical history significant for osteoarthritis, TIA, GERD, pancytopenia, history of essentially hypertension, prior history of TIA, and recent admission for symptomatic anemia, presents  with worsening anemia and hx of melena for 3 days , weakness . She is found to have a hg of 7.4 lower than her recent admission , has been taking naprosen for left shoulder pain.   Clinical Impression   Pt at baseline level of function and with ADLs and ADL mobility. PTA pt lives at home with her son, daughter in law and grandson and has 24/7 sup and assist. Pt's PLOF required assist with bathing and dressing. All education completed and no further acute OT is indicated at this time    Follow Up Recommendations  No OT follow up;Supervision/Assistance - 24 hour    Equipment Recommendations  3 in 1 bedside commode;Other (comment)(grab bars in bathroom)    Recommendations for Other Services       Precautions / Restrictions Precautions Precautions: Fall      Mobility Bed Mobility Overal bed mobility: Needs Assistance Bed Mobility: Supine to Sit     Supine to sit: Min assist     General bed mobility comments: min A to elevate trunk  Transfers Overall transfer level: Needs assistance Equipment used: Rolling walker (2 wheeled) Transfers: Sit to/from [employer] CorporationStand;Stand Pivot Transfers Sit to Stand: Min guard Stand pivot transfers: Min guard       General transfer comment: assist with RW direction and correct hand placement    Balance Overall balance assessment: Needs assistance Sitting-balance support: No upper extremity supported;Feet supported Sitting balance-Leahy Scale: Fair     Standing balance support: Single extremity supported;Bilateral upper extremity supported;During functional activity Standing  balance-Leahy Scale: Poor Standing balance comment: unsteady using RW                           ADL either performed or assessed with clinical judgement   ADL Overall ADL's : At baseline                                       General ADL Comments: pt requires assist from family for bathing and dressing at baseline. Pt able to toilet and feed herself per her daughter in law     Vision Baseline Vision/History: Wears glasses Wears Glasses: Reading only Patient Visual Report: No change from baseline       Perception     Praxis      Pertinent Vitals/Pain Pain Assessment: No/denies pain     Hand Dominance Left   Extremity/Trunk Assessment Upper Extremity Assessment Upper Extremity Assessment: Generalized weakness;LUE deficits/detail LUE Deficits / Details: shouler ROM impaired due to pain PTA   Lower Extremity Assessment Lower Extremity Assessment: Defer to PT evaluation   Cervical / Trunk Assessment Cervical / Trunk Assessment: Normal   Communication Communication Communication: Prefers language other than [place];Other (comment)(Nepalese, daughter in law present to interpret)   Cognition Arousal/Alertness: Awake/alert Behavior During Therapy: WFL for tasks assessed/performed Overall Cognitive Status: Within Functional Limits for tasks assessed  General Comments       Exercises     Shoulder Instructions      Home Living Family/patient expects to be discharged to:: Private residence Living Arrangements: Children Available Help at Discharge: Family;Available 24 hours/day Type of Home: Apartment Home Access: Stairs to enter     Home Layout: One level     Bathroom Shower/Tub: Producer, television/film/video: Standard     Home Equipment: Cane - single point;Walker - 2 wheels          Prior Functioning/Environment Level of Independence: Needs assistance  Gait / Transfers  Assistance Needed: pt uses cane at home, also has RW ADL's / Homemaking Assistance Needed: requires assist for ll bathing and dressing per daughter in law. Pt is able to feed herself and toilet herself per family   Comments: pt has 24/7 sup and assist from family        OT Problem List: Decreased strength;Decreased activity tolerance;Decreased knowledge of use of DME or AE;Impaired UE functional use;Decreased range of motion;Impaired balance (sitting and/or standing);Decreased coordination;Pain      OT Treatment/Interventions:      OT Goals(Current goals can be found in the care plan section) Acute Rehab OT Goals Patient Stated Goal: get stronger, go home OT Goal Formulation: All assessment and education complete, DC therapy  OT Frequency:     Barriers to D/C:    no barriers, pt has 24/7 sup and assist       Co-evaluation              AM-PAC PT "6 Clicks" Daily Activity     Outcome Measure Help from another person eating meals?: None(set up) Help from another person taking care of personal grooming?: None Help from another person toileting, which includes using toliet, bedpan, or urinal?: A Little Help from another person bathing (including washing, rinsing, drying)?: A Lot Help from another person to put on and taking off regular upper body clothing?: A Lot Help from another person to put on and taking off regular lower body clothing?: A Lot 6 Click Score: 17   End of Session Equipment Utilized During Treatment: Gait belt;Rolling walker  Activity Tolerance: Patient tolerated treatment well Patient left: in chair;with call bell/phone within reach;with family/visitor present  OT Visit Diagnosis: Unsteadiness on feet (R26.81);Muscle weakness (generalized) (M62.81);Pain Pain - Right/Left: Left Pain - part of body: Shoulder                Time: 1610-9604 OT Time Calculation (min): 24 min Charges:  OT General Charges $OT Visit: 1 Visit OT Evaluation $OT Eval Low  Complexity: 1 Low G-Codes: OT G-codes **NOT FOR INPATIENT CLASS** Functional Assessment Tool Used: AM-PAC 6 Clicks Daily Activity     Galen Manila 09/11/2017, 11:57 AM

## 2017-09-11 NOTE — Progress Notes (Signed)
NP made aware of temperature of 100.5. Patient is for 2nd unit of blood.

## 2017-09-11 NOTE — Progress Notes (Signed)
Consent obtained using [employer] translator

## 2017-09-11 NOTE — Evaluation (Signed)
Physical Therapy Evaluation Patient Details Name: Michelle Boyd MRN: [id number] DOB: [birth year] Today's Date: 09/11/2017   History of Present Illness  67 y.o. [place] female with medical history significant for osteoarthritis, TIA, GERD, pancytopenia, history of essentially hypertension, prior history of TIA, and recent admission for symptomatic anemia, presents  with worsening anemia and hx of melena for 3 days , weakness . She is found to have a hg of 7.4 lower than her recent admission , has been taking naprosen for left shoulder pain.  Clinical Impression  Pt admitted with above diagnosis. Pt currently with functional limitations due to the deficits listed below (see PT Problem List). Patient requires assistance with ADL's at baseline and is a household ambulator. Patient daughter ijn law states patient has history of multiple falls. Patient displaying functional strength deficits, decreased endurance, and imbalance with dynamic gait. Ambulating 50 feet with cane and min guard assist. Pt will benefit from skilled PT to increase their independence and safety with mobility to allow discharge to the venue listed below.       Follow Up Recommendations Home health PT;Supervision for mobility/OOB    Equipment Recommendations  None recommended by PT    Recommendations for Other Services       Precautions / Restrictions Precautions Precautions: Fall Restrictions Weight Bearing Restrictions: No      Mobility  Bed Mobility Overal bed mobility: Needs Assistance Bed Mobility: Sit to Supine     Supine to sit: Min assist Sit to supine: Min assist   General bed mobility comments: pt daughter in law provided min assist to elevate legs into bed  Transfers Overall transfer level: Needs assistance Equipment used: Straight cane Transfers: Sit to/from Stand Sit to Stand: Min guard Stand pivot transfers: Min guard       General transfer comment: assist with RW direction and correct hand  placement  Ambulation/Gait Ambulation/Gait assistance: Min guard Gait Distance (Feet): 50 Feet Assistive device: Straight cane Gait Pattern/deviations: Step-to pattern;Decreased stride length;Narrow base of support Gait velocity: decreased Gait velocity interpretation: <1.31 ft/sec, indicative of household ambulator General Gait Details: Patient with decreased fluidity throughout gait and short, halting steps. No overt LOB noted.   Stairs            Wheelchair Mobility    Modified Rankin (Stroke Patients Only)       Balance Overall balance assessment: Needs assistance Sitting-balance support: No upper extremity supported;Feet supported Sitting balance-Leahy Scale: Good     Standing balance support: Single extremity supported Standing balance-Leahy Scale: Fair Standing balance comment: unsteady using RW                             Pertinent Vitals/Pain Pain Assessment: Faces Faces Pain Scale: No hurt    Home Living Family/patient expects to be discharged to:: Private residence Living Arrangements: Children Available Help at Discharge: Family;Available 24 hours/day Type of Home: Apartment Home Access: Stairs to enter Entrance Stairs-Rails: None Entrance Stairs-Number of Steps: 2 Home Layout: One level Home Equipment: Cane - single point;Walker - 2 wheels      Prior Function Level of Independence: Needs assistance   Gait / Transfers Assistance Needed: pt uses cane and is a household ambulator  ADL's / Homemaking Assistance Needed: requires assist for ll bathing and dressing per daughter in law. Pt is able to feed herself and toilet herself per family  Comments: pt has 24/7 sup and assist from family. pt daughter reports pt has  had multiple falls in the past couple months.     Hand Dominance   Dominant Hand: Left    Extremity/Trunk Assessment   Upper Extremity Assessment Upper Extremity Assessment: Defer to OT evaluation LUE Deficits /  Details: shouler ROM impaired due to pain PTA    Lower Extremity Assessment Lower Extremity Assessment: Generalized weakness    Cervical / Trunk Assessment Cervical / Trunk Assessment: Normal  Communication   Communication: Prefers language other than English(Nepalese. pt daughter in law offering to interpret)  Cognition Arousal/Alertness: Awake/alert Behavior During Therapy: Flat affect Overall Cognitive Status: Within Functional Limits for tasks assessed                                        General Comments General comments (skin integrity, edema, etc.): patient daughter in law present throughout    Exercises     Assessment/Plan    PT Assessment Patient needs continued PT services  PT Problem List Decreased strength;Decreased activity tolerance;Decreased balance;Decreased mobility       PT Treatment Interventions Gait training;Stair training;Functional mobility training;Therapeutic activities;Therapeutic exercise;Balance training;Patient/family education    PT Goals (Current goals can be found in the Care Plan section)  Acute Rehab PT Goals Patient Stated Goal: "walk more." PT Goal Formulation: With patient Time For Goal Achievement: 09/25/17 Potential to Achieve Goals: Good    Frequency Min 3X/week   Barriers to discharge        Co-evaluation               AM-PAC PT "6 Clicks" Daily Activity  Outcome Measure Difficulty turning over in bed (including adjusting bedclothes, sheets and blankets)?: A Little Difficulty moving from lying on back to sitting on the side of the bed? : Unable Difficulty sitting down on and standing up from a chair with arms (e.g., wheelchair, bedside commode, etc,.)?: A Little Help needed moving to and from a bed to chair (including a wheelchair)?: A Little Help needed walking in hospital room?: A Little Help needed climbing 3-5 steps with a railing? : A Little 6 Click Score: 16    End of Session Equipment  Utilized During Treatment: Gait belt Activity Tolerance: Patient tolerated treatment well Patient left: in bed;with call bell/phone within reach;with family/visitor present Nurse Communication: Mobility status PT Visit Diagnosis: Unsteadiness on feet (R26.81);Muscle weakness (generalized) (M62.81)    Time: [id number] PT Time Calculation (min) (ACUTE ONLY): 19 min   Charges:   PT Evaluation $PT Eval Moderate Complexity: 1 Mod     PT G Codes:        Michelle Boyd, PT, DPT Acute Rehabilitation Services  Pager: [phone]   Michelle Boyd 09/11/2017, 12:22 PM

## 2017-09-11 NOTE — Consult Note (Signed)
[facility] Consultation Note  Referring Provider: Dr. Ripudeep Rai ([facility]) Primary Care Physician:  Newlin, Enobong, MD Primary Gastroenterologist:  Dr. Landry Lookingbill  Reason for Consultation:  Anemia, blood in stool  HPI: Michelle Boyd is a 67 y.o. female admitted for anemia and blood in stool.  Nepali interpretation provided by patient's daughter, who is at the bedise.  Patient has history of anemia, had colonoscopy last week showing few small distal polyps (removed); post colonoscopy had some blood in stool for a couple days, but this has resolved.  Has had some subjective fevers (felt hot, no measurement taken); has longstanding musculoskeletal and abdominal discomfort.  Hgb yesterday 7.3, and was as low as 7.9 one month ago.  No nausea, vomiting, hematemesis.  CT scan yesterday showed adnexal lesion. Weight loss?   Past Medical History:  Diagnosis Date  . Arthritis   . Cholelithiasis   . Chronic knee pain   . Gallstone pancreatitis   . GERD (gastroesophageal reflux disease)   . Hypertension   . Memory loss     Past Surgical History:  Procedure Laterality Date  . CATARACT EXTRACTION  2016  . CHOLECYSTECTOMY N/A 10/04/2013   Procedure: LAPAROSCOPIC CHOLECYSTECTOMY WITH INTRAOPERATIVE CHOLANGIOGRAM;  Surgeon: Armando Ramirez, MD;  Location: [facility] OR;  Service: General;  Laterality: N/A;  . EXTERNAL EAR SURGERY  years ago   right mastoidectomy    Prior to Admission medications   Medication Sig Start Date End Date Taking? Authorizing Provider  gabapentin (NEURONTIN) 100 MG capsule Take 1 capsule (100 mg total) by mouth 2 (two) times daily. 06/19/17  Yes Blackman, Christopher Y, MD  hydrALAZINE (APRESOLINE) 50 MG tablet Take 1 tablet (50 mg total) by mouth 3 (three) times daily. 05/15/17  Yes Newlin, Enobong, MD  cetirizine (ZYRTEC) 10 MG tablet Take 1 tablet (10 mg total) by mouth daily. Patient not taking: Reported on 03/29/2017 02/22/17   Newlin, Enobong, MD  ferrous sulfate 325 (65 FE)  MG tablet Take 1 tablet (325 mg total) by mouth 2 (two) times daily with a meal. 07/23/17 08/22/17  Amin, Ankit Chirag, MD  hydrALAZINE (APRESOLINE) 50 MG tablet TAKE 1 TABLET BY MOUTH THREE TIMES DAILY Patient not taking: Reported on 09/10/2017 09/03/17   Newlin, Enobong, MD  lactulose (CHRONULAC) 10 GM/15ML solution Take 15 mLs (10 g total) by mouth 3 (three) times daily. Patient not taking: Reported on 09/21/2016 10/18/15   Newlin, Enobong, MD  losartan-hydrochlorothiazide (HYZAAR) 100-25 MG tablet Take 1 tablet by mouth daily. Patient not taking: Reported on 07/20/2017 03/29/17   Newlin, Enobong, MD  methylPREDNISolone (MEDROL) 4 MG tablet Medrol dose pack. Take as instructed Patient not taking: Reported on 07/20/2017 06/19/17   Blackman, Christopher Y, MD  naproxen (NAPROSYN) 500 MG tablet Take 1 tablet (500 mg total) by mouth 2 (two) times daily between meals as needed. Patient not taking: Reported on 07/20/2017 06/19/17   Blackman, Christopher Y, MD  traMADol (ULTRAM) 50 MG tablet Take 1 tablet (50 mg total) by mouth 3 (three) times daily as needed. Patient not taking: Reported on 07/20/2017 06/19/17   Blackman, Christopher Y, MD    Current Facility-Administered Medications  Medication Dose Route Frequency Provider Last Rate Last Dose  . 0.9 %  sodium chloride infusion   Intravenous Continuous Rai, Ripudeep K, MD 75 mL/hr at 09/11/17 0904    . acetaminophen (TYLENOL) tablet 650 mg  650 mg Oral Q6H PRN Wertman, Sara E, PA-C       Or  . acetaminophen (TYLENOL) suppository   650 mg  650 mg Rectal Q6H PRN Wertman, Sara E, PA-C      . bisacodyl (DULCOLAX) suppository 10 mg  10 mg Rectal Daily PRN Wertman, Sara E, PA-C      . famotidine (PEPCID) IVPB 20 mg premix  20 mg Intravenous Q12H Wertman, Sara E, PA-C 100 mL/hr at 09/10/17 2124 20 mg at 09/10/17 2124  . gabapentin (NEURONTIN) capsule 100 mg  100 mg Oral BID Wertman, Sara E, PA-C   100 mg at 09/11/17 1037  . hydrALAZINE (APRESOLINE) tablet 50 mg  50 mg Oral TID  Wertman, Sara E, PA-C   50 mg at 09/11/17 1037  . HYDROcodone-acetaminophen (NORCO/VICODIN) 5-325 MG per tablet 1-2 tablet  1-2 tablet Oral Q4H PRN Wertman, Sara E, PA-C      . morphine 4 MG/ML injection 1 mg  1 mg Intravenous Q4H PRN Wertman, Sara E, PA-C      . ondansetron (ZOFRAN) tablet 4 mg  4 mg Oral Q6H PRN Wertman, Sara E, PA-C       Or  . ondansetron (ZOFRAN) injection 4 mg  4 mg Intravenous Q6H PRN Wertman, Sara E, PA-C      . pantoprazole (PROTONIX) 80 mg in sodium chloride 0.9 % 250 mL (0.32 mg/mL) infusion  8 mg/hr Intravenous Continuous Abrol, Nayana, MD 25 mL/hr at 09/11/17 0830 8 mg/hr at 09/11/17 0830  . senna-docusate (Senokot-S) tablet 1 tablet  1 tablet Oral QHS PRN Wertman, Sara E, PA-C        Allergies as of 09/10/2017 - Review Complete 09/10/2017  Allergen Reaction Noted  . Feosol [iron] Itching 04/19/2014    Family History  Problem Relation Age of Onset  . Diabetes Father     Social History   Socioeconomic History  . Marital status: Divorced    Spouse name: Not on file  . Number of children: Not on file  . Years of education: ESL  . Highest education level: Not on file  Occupational History    Comment: na  Social Needs  . Financial resource strain: Not on file  . Food insecurity:    Worry: Not on file    Inability: Not on file  . Transportation needs:    Medical: Not on file    Non-medical: Not on file  Tobacco Use  . Smoking status: Never Smoker  . Smokeless tobacco: Never Used  Substance and Sexual Activity  . Alcohol use: No  . Drug use: No  . Sexual activity: Never    Birth control/protection: Abstinence  Lifestyle  . Physical activity:    Days per week: Not on file    Minutes per session: Not on file  . Stress: Not on file  Relationships  . Social connections:    Talks on phone: Not on file    Gets together: Not on file    Attends religious service: Not on file    Active member of club or organization: Not on file    Attends  meetings of clubs or organizations: Not on file    Relationship status: Not on file  . Intimate partner violence:    Fear of current or ex partner: Not on file    Emotionally abused: Not on file    Physically abused: Not on file    Forced sexual activity: Not on file  Other Topics Concern  . Not on file  Social History Narrative   Lives at home with son, daughter-in-law   Caffeine use- coffee 3-4 cups daily,   tea occas    Review of Systems: As per HPI, all others negative.  Physical Exam: Vital signs in last 24 hours: Temp:  [97.3 F (36.3 C)-100.5 F (38.1 C)] 97.7 F (36.5 C) (07/24 0756) Pulse Rate:  [68-110] 68 (07/24 0756) Resp:  [16-20] 20 (07/24 0756) BP: (119-156)/(56-95) 149/68 (07/24 0756) SpO2:  [95 %-99 %] 97 % (07/24 0756) Weight:  [54.4 kg (119 lb 14.9 oz)] 54.4 kg (119 lb 14.9 oz) (07/23 1937) Last BM Date: 09/10/17 General:   Alert,  Well-developed, well-nourished, pleasant and cooperative in NAD Head:  Normocephalic and atraumatic. Eyes:  Sclera clear, no icterus.   Conjunctiva pink. Ears:  Normal auditory acuity. Nose:  No deformity, discharge,  or lesions. Mouth:  No deformity or lesions.  Oropharynx pink & moist. Neck:  Supple; no masses or thyromegaly. Lungs:  Clear throughout to auscultation.   No wheezes, crackles, or rhonchi. No acute distress. Heart:  Regular rate and rhythm; no murmurs, clicks, rubs,  or gallops. Abdomen:  Soft, mild generalized tenderness (unchanged since I first saw her few weeks ago). No masses, hepatosplenomegaly or hernias noted. Normal bowel sounds, without guarding, and without rebound.     Msk:  Symmetrical without gross deformities. Normal posture. Pulses:  Normal pulses noted. Extremities:  Without clubbing or edema. Neurologic:  Alert and  oriented x4; diffusely weak, otherwise grossly normal neurologically. Skin:  Intact without significant lesions or rashes. Psych:  Alert and cooperative. Normal mood and  affect.   Lab Results: Recent Labs    09/10/17 1846 09/11/17 0055 09/11/17 0643  WBC 2.6* 2.9* 2.8*  HGB 6.4* 7.8* 9.7*  HCT 20.9* 25.2* 30.2*  PLT 129* 129* 123*   BMET Recent Labs    09/10/17 1228 09/11/17 0055  NA 135 134*  K 3.1* 3.1*  CL 103 106  CO2 24 23  GLUCOSE 121* 146*  BUN 11 11  CREATININE 1.14* 1.02*  CALCIUM 7.7* 7.1*   LFT Recent Labs    09/11/17 0055  PROT 6.1*  ALBUMIN 1.8*  AST 34  ALT 12  ALKPHOS 71  BILITOT 1.0   PT/INR No results for input(s): LABPROT, INR in the last 72 hours.  Studies/Results: Ct Abdomen Pelvis W Contrast  Result Date: 09/10/2017 CLINICAL DATA:  67-year-old with abdominal pain.  Bloody stool. EXAM: CT ABDOMEN AND PELVIS WITH CONTRAST TECHNIQUE: Multidetector CT imaging of the abdomen and pelvis was performed using the standard protocol following bolus administration of intravenous contrast. CONTRAST:  100mL OMNIPAQUE IOHEXOL 300 MG/ML  SOLN COMPARISON:  10/22/2013 and 10/02/2013 FINDINGS: Lower chest: There are several small nodular densities at the lung bases. However, these appear to be stable from the exam on 10/22/2013. There is volume loss in the left lower lobe. No large pleural effusions. Hepatobiliary: Decreased attenuation of the liver and this could be related to steatosis. Small amount of perihepatic fluid along the inferior aspect of the liver. Main portal venous system is patent. Gallbladder appears to be surgically absent. Pancreas: Unremarkable. No pancreatic ductal dilatation or surrounding inflammatory changes. Spleen: Normal in size without focal abnormality. Adrenals/Urinary Tract: Normal adrenal glands. Urinary bladder is unremarkable. Negative for hydronephrosis. Normal appearance of both kidneys. Probable cyst in the anterior mid left kidney. Stomach/Bowel: Normal appearance of the stomach and duodenum. The rectum and colon are decompressed and difficult to evaluate for wall thickening. There is no significant  bowel dilatation. No focal bowel inflammation. Vascular/Lymphatic: Atherosclerotic disease in the abdominal aorta without aneurysm. The main visceral arteries   are patent. No significant lymph node enlargement in the abdomen or pelvis. Reproductive: There is a 3.9 x 3.1 cm round low-density structure associated with the right adnexa. Previously, there was a 2.3 cm low-density structure in this area. No gross abnormality to the left adnexa. Normal appearance of the uterus. Other: Trace free fluid in the pelvis.  Negative for free air. Musculoskeletal: No acute bone abnormality. IMPRESSION: Small amount of ascites in the abdomen and pelvis but no focal inflammation within the abdomen or pelvis. **An incidental finding of potential clinical significance has been found. Enlarging low-density structure that appears to be associated with the right adnexa. This structure measures up to 3.9 cm and may represent an adnexal cyst. Recommend follow-up pelvic ultrasound to further characterize the structure.** Electronically Signed   By: Adam  Henn M.D.   On: 09/10/2017 18:51   Dg Shoulder Left  Result Date: 09/10/2017 CLINICAL DATA:  Left shoulder pain, no injury EXAM: LEFT SHOULDER - 2+ VIEW COMPARISON:  None. FINDINGS: There is no evidence of fracture or dislocation. There is no evidence of arthropathy or other focal bone abnormality. Soft tissues are unremarkable. IMPRESSION: Negative. Electronically Signed   By: Charles  Clark M.D.   On: 09/10/2017 16:40    Impression:  1.  Blood in stool over weekend, after colonoscopy last Friday, now resolved.   2.  Anemia with pancytopenia (mild), unclear etiology. 3.  Diffuse abdominal and musculoskeletal pains, chronic, unclear etiology. 4.  Subjective fevers, now resolved. 5.  Adnexal lesion.  Plan:  1.  EGD tomorrow to assess for source of anemia (has had troubles with anemia prior to her colonoscopy). 2.  Risks (bleeding, infection, bowel perforation that could  require surgery, sedation-related changes in cardiopulmonary systems), benefits (identification and possible treatment of source of symptoms, exclusion of certain causes of symptoms), and alternatives (watchful waiting, radiographic imaging studies, empiric medical treatment) of upper endoscopy (EGD) were explained to patient/family in detail and patient wishes to proceed. 3.  If EGD is unrevealing, then would consult hematology for evaluation of pancytopenia. 4.  Supportive care:  Serial CBCs, IVF, PPI. 5.  Doubt utility of flexible sigmoidoscopy at this time, as the blood in stool was transient over the weekend and has resolved. 6.  [facility] will follow.   LOS: 1 day   Mckensie Scotti M  09/11/2017, 10:58 AM  Cell [phone] If no answer or after 5 PM call [phone]  

## 2017-09-11 NOTE — H&P (View-Only) (Signed)
[facility] Gastroenterology Consultation Note  Referring Provider: Dr. Thad Ranger [facility]) Primary Care Physician:  Hoy Register, MD Primary Gastroenterologist:  Dr. Dulce Sellar  Reason for Consultation:  Anemia, blood in stool  HPI: Michelle Boyd is a 67 y.o. female admitted for anemia and blood in stool.  Nepali interpretation provided by patient's daughter, who is at the bedise.  Patient has history of anemia, had colonoscopy last week showing few small distal polyps (removed); post colonoscopy had some blood in stool for a couple days, but this has resolved.  Has had some subjective fevers (felt hot, no measurement taken); has longstanding musculoskeletal and abdominal discomfort.  Hgb yesterday 7.3, and was as low as 7.9 one month ago.  No nausea, vomiting, hematemesis.  CT scan yesterday showed adnexal lesion. Weight loss?   Past Medical History:  Diagnosis Date  . Arthritis   . Cholelithiasis   . Chronic knee pain   . Gallstone pancreatitis   . GERD (gastroesophageal reflux disease)   . Hypertension   . Memory loss     Past Surgical History:  Procedure Laterality Date  . CATARACT EXTRACTION  2016  . CHOLECYSTECTOMY N/A 10/04/2013   Procedure: LAPAROSCOPIC CHOLECYSTECTOMY WITH INTRAOPERATIVE CHOLANGIOGRAM;  Surgeon: Axel Filler, MD;  Location: [facility] OR;  Service: General;  Laterality: N/A;  . EXTERNAL EAR SURGERY  years ago   right mastoidectomy    Prior to Admission medications   Medication Sig Start Date End Date Taking? Authorizing Provider  gabapentin (NEURONTIN) 100 MG capsule Take 1 capsule (100 mg total) by mouth 2 (two) times daily. 06/19/17  Yes Kathryne Hitch, MD  hydrALAZINE (APRESOLINE) 50 MG tablet Take 1 tablet (50 mg total) by mouth 3 (three) times daily. 05/15/17  Yes Hoy Register, MD  cetirizine (ZYRTEC) 10 MG tablet Take 1 tablet (10 mg total) by mouth daily. Patient not taking: Reported on 03/29/2017 02/22/17   Hoy Register, MD  ferrous sulfate 325 (65 FE)  MG tablet Take 1 tablet (325 mg total) by mouth 2 (two) times daily with a meal. 07/23/17 08/22/17  Amin, Loura Halt, MD  hydrALAZINE (APRESOLINE) 50 MG tablet TAKE 1 TABLET BY MOUTH THREE TIMES DAILY Patient not taking: Reported on 09/10/2017 09/03/17   Hoy Register, MD  lactulose (CHRONULAC) 10 GM/15ML solution Take 15 mLs (10 g total) by mouth 3 (three) times daily. Patient not taking: Reported on 09/21/2016 10/18/15   Hoy Register, MD  losartan-hydrochlorothiazide (HYZAAR) 100-25 MG tablet Take 1 tablet by mouth daily. Patient not taking: Reported on 07/20/2017 03/29/17   Hoy Register, MD  methylPREDNISolone (MEDROL) 4 MG tablet Medrol dose pack. Take as instructed Patient not taking: Reported on 07/20/2017 06/19/17   Kathryne Hitch, MD  naproxen (NAPROSYN) 500 MG tablet Take 1 tablet (500 mg total) by mouth 2 (two) times daily between meals as needed. Patient not taking: Reported on 07/20/2017 06/19/17   Kathryne Hitch, MD  traMADol (ULTRAM) 50 MG tablet Take 1 tablet (50 mg total) by mouth 3 (three) times daily as needed. Patient not taking: Reported on 07/20/2017 06/19/17   Kathryne Hitch, MD    Current Facility-Administered Medications  Medication Dose Route Frequency Provider Last Rate Last Dose  . 0.9 %  sodium chloride infusion   Intravenous Continuous Rai, Ripudeep K, MD 75 mL/hr at 09/11/17 0904    . acetaminophen (TYLENOL) tablet 650 mg  650 mg Oral Q6H PRN Marcos Eke, PA-C       Or  . acetaminophen (TYLENOL) suppository  650 mg  650 mg Rectal Q6H PRN Marcos EkeWertman, Sara E, PA-C      . bisacodyl (DULCOLAX) suppository 10 mg  10 mg Rectal Daily PRN Marcos EkeWertman, Sara E, PA-C      . famotidine (PEPCID) IVPB 20 mg premix  20 mg Intravenous Q12H Marcos EkeWertman, Sara E, PA-C 100 mL/hr at 09/10/17 2124 20 mg at 09/10/17 2124  . gabapentin (NEURONTIN) capsule 100 mg  100 mg Oral BID Marcos EkeWertman, Sara E, PA-C   100 mg at 09/11/17 1037  . hydrALAZINE (APRESOLINE) tablet 50 mg  50 mg Oral TID  Marlowe KaysWertman, Sara E, PA-C   50 mg at 09/11/17 1037  . HYDROcodone-acetaminophen (NORCO/VICODIN) 5-325 MG per tablet 1-2 tablet  1-2 tablet Oral Q4H PRN Marcos EkeWertman, Sara E, PA-C      . morphine 4 MG/ML injection 1 mg  1 mg Intravenous Q4H PRN Marcos EkeWertman, Sara E, PA-C      . ondansetron (ZOFRAN) tablet 4 mg  4 mg Oral Q6H PRN Marcos EkeWertman, Sara E, PA-C       Or  . ondansetron (ZOFRAN) injection 4 mg  4 mg Intravenous Q6H PRN Marcos EkeWertman, Sara E, PA-C      . pantoprazole (PROTONIX) 80 mg in sodium chloride 0.9 % 250 mL (0.32 mg/mL) infusion  8 mg/hr Intravenous Continuous Richarda OverlieAbrol, Nayana, MD 25 mL/hr at 09/11/17 0830 8 mg/hr at 09/11/17 0830  . senna-docusate (Senokot-S) tablet 1 tablet  1 tablet Oral QHS PRN Marcos EkeWertman, Sara E, PA-C        Allergies as of 09/10/2017 - Review Complete 09/10/2017  Allergen Reaction Noted  . Feosol [iron] Itching 04/19/2014    Family History  Problem Relation Age of Onset  . Diabetes Father     Social History   Socioeconomic History  . Marital status: Divorced    Spouse name: Not on file  . Number of children: Not on file  . Years of education: ESL  . Highest education level: Not on file  Occupational History    Comment: na  Social Needs  . Financial resource strain: Not on file  . Food insecurity:    Worry: Not on file    Inability: Not on file  . Transportation needs:    Medical: Not on file    Non-medical: Not on file  Tobacco Use  . Smoking status: Never Smoker  . Smokeless tobacco: Never Used  Substance and Sexual Activity  . Alcohol use: No  . Drug use: No  . Sexual activity: Never    Birth control/protection: Abstinence  Lifestyle  . Physical activity:    Days per week: Not on file    Minutes per session: Not on file  . Stress: Not on file  Relationships  . Social connections:    Talks on phone: Not on file    Gets together: Not on file    Attends religious service: Not on file    Active member of club or organization: Not on file    Attends  meetings of clubs or organizations: Not on file    Relationship status: Not on file  . Intimate partner violence:    Fear of current or ex partner: Not on file    Emotionally abused: Not on file    Physically abused: Not on file    Forced sexual activity: Not on file  Other Topics Concern  . Not on file  Social History Narrative   Lives at home with son, daughter-in-law   Caffeine use- coffee 3-4 cups daily,  tea occas    Review of Systems: As per HPI, all others negative.  Physical Exam: Vital signs in last 24 hours: Temp:  [97.3 F (36.3 C)-100.5 F (38.1 C)] 97.7 F (36.5 C) (07/24 0756) Pulse Rate:  [68-110] 68 (07/24 0756) Resp:  [16-20] 20 (07/24 0756) BP: (119-156)/(56-95) 149/68 (07/24 0756) SpO2:  [95 %-99 %] 97 % (07/24 0756) Weight:  [54.4 kg (119 lb 14.9 oz)] 54.4 kg (119 lb 14.9 oz) (07/23 1937) Last BM Date: 09/10/17 General:   Alert,  Well-developed, well-nourished, pleasant and cooperative in NAD Head:  Normocephalic and atraumatic. Eyes:  Sclera clear, no icterus.   Conjunctiva pink. Ears:  Normal auditory acuity. Nose:  No deformity, discharge,  or lesions. Mouth:  No deformity or lesions.  Oropharynx pink & moist. Neck:  Supple; no masses or thyromegaly. Lungs:  Clear throughout to auscultation.   No wheezes, crackles, or rhonchi. No acute distress. Heart:  Regular rate and rhythm; no murmurs, clicks, rubs,  or gallops. Abdomen:  Soft, mild generalized tenderness (unchanged since I first saw her few weeks ago). No masses, hepatosplenomegaly or hernias noted. Normal bowel sounds, without guarding, and without rebound.     Msk:  Symmetrical without gross deformities. Normal posture. Pulses:  Normal pulses noted. Extremities:  Without clubbing or edema. Neurologic:  Alert and  oriented x4; diffusely weak, otherwise grossly normal neurologically. Skin:  Intact without significant lesions or rashes. Psych:  Alert and cooperative. Normal mood and  affect.   Lab Results: Recent Labs    09/10/17 1846 09/11/17 0055 09/11/17 0643  WBC 2.6* 2.9* 2.8*  HGB 6.4* 7.8* 9.7*  HCT 20.9* 25.2* 30.2*  PLT 129* 129* 123*   BMET Recent Labs    09/10/17 1228 09/11/17 0055  NA 135 134*  K 3.1* 3.1*  CL 103 106  CO2 24 23  GLUCOSE 121* 146*  BUN 11 11  CREATININE 1.14* 1.02*  CALCIUM 7.7* 7.1*   LFT Recent Labs    09/11/17 0055  PROT 6.1*  ALBUMIN 1.8*  AST 34  ALT 12  ALKPHOS 71  BILITOT 1.0   PT/INR No results for input(s): LABPROT, INR in the last 72 hours.  Studies/Results: Ct Abdomen Pelvis W Contrast  Result Date: 09/10/2017 CLINICAL DATA:  67 year old with abdominal pain.  Bloody stool. EXAM: CT ABDOMEN AND PELVIS WITH CONTRAST TECHNIQUE: Multidetector CT imaging of the abdomen and pelvis was performed using the standard protocol following bolus administration of intravenous contrast. CONTRAST:  OMNIPAQUE IOHEXOL 300 MG/ML  SOLN COMPARISON:  10/22/2013 and 10/02/2013 FINDINGS: Lower chest: There are several small nodular densities at the lung bases. However, these appear to be stable from the exam on 10/22/2013. There is volume loss in the left lower lobe. No large pleural effusions. Hepatobiliary: Decreased attenuation of the liver and this could be related to steatosis. Small amount of perihepatic fluid along the inferior aspect of the liver. Main portal venous system is patent. Gallbladder appears to be surgically absent. Pancreas: Unremarkable. No pancreatic ductal dilatation or surrounding inflammatory changes. Spleen: Normal in size without focal abnormality. Adrenals/Urinary Tract: Normal adrenal glands. Urinary bladder is unremarkable. Negative for hydronephrosis. Normal appearance of both kidneys. Probable cyst in the anterior mid left kidney. Stomach/Bowel: Normal appearance of the stomach and duodenum. The rectum and colon are decompressed and difficult to evaluate for wall thickening. There is no significant  bowel dilatation. No focal bowel inflammation. Vascular/Lymphatic: Atherosclerotic disease in the abdominal aorta without aneurysm. The main visceral arteries  are patent. No significant lymph node enlargement in the abdomen or pelvis. Reproductive: There is a 3.9 x 3.1 cm round low-density structure associated with the right adnexa. Previously, there was a 2.3 cm low-density structure in this area. No gross abnormality to the left adnexa. Normal appearance of the uterus. Other: Trace free fluid in the pelvis.  Negative for free air. Musculoskeletal: No acute bone abnormality. IMPRESSION: Small amount of ascites in the abdomen and pelvis but no focal inflammation within the abdomen or pelvis. **An incidental finding of potential clinical significance has been found. Enlarging low-density structure that appears to be associated with the right adnexa. This structure measures up to 3.9 cm and may represent an adnexal cyst. Recommend follow-up pelvic ultrasound to further characterize the structure.** Electronically Signed   By: Richarda Overlie M.D.   On: 09/10/2017 18:51   Dg Shoulder Left  Result Date: 09/10/2017 CLINICAL DATA:  Left shoulder pain, no injury EXAM: LEFT SHOULDER - 2+ VIEW COMPARISON:  None. FINDINGS: There is no evidence of fracture or dislocation. There is no evidence of arthropathy or other focal bone abnormality. Soft tissues are unremarkable. IMPRESSION: Negative. Electronically Signed   By: Marlan Palau M.D.   On: 09/10/2017 16:40    Impression:  1.  Blood in stool over weekend, after colonoscopy last Friday, now resolved.   2.  Anemia with pancytopenia (mild), unclear etiology. 3.  Diffuse abdominal and musculoskeletal pains, chronic, unclear etiology. 4.  Subjective fevers, now resolved. 5.  Adnexal lesion.  Plan:  1.  EGD tomorrow to assess for source of anemia (has had troubles with anemia prior to her colonoscopy). 2.  Risks (bleeding, infection, bowel perforation that could  require surgery, sedation-related changes in cardiopulmonary systems), benefits (identification and possible treatment of source of symptoms, exclusion of certain causes of symptoms), and alternatives (watchful waiting, radiographic imaging studies, empiric medical treatment) of upper endoscopy (EGD) were explained to patient/family in detail and patient wishes to proceed. 3.  If EGD is unrevealing, then would consult hematology for evaluation of pancytopenia. 4.  Supportive care:  Serial CBCs, IVF, PPI. 5.  Doubt utility of flexible sigmoidoscopy at this time, as the blood in stool was transient over the weekend and has resolved. 6.  [facility] will follow.   LOS: 1 day   Talise Sligh M  09/11/2017, 10:58 AM  Cell [phone] If no answer or after 5 PM call [phone]

## 2017-09-12 ENCOUNTER — Inpatient Hospital Stay (HOSPITAL_COMMUNITY): Payer: Medicaid Other | Admitting: Anesthesiology

## 2017-09-12 ENCOUNTER — Encounter (HOSPITAL_COMMUNITY)
Admission: EM | Disposition: A | Discharge status: Home Health | Payer: Self-pay | Source: Home / Self Care | Attending: Internal Medicine

## 2017-09-12 ENCOUNTER — Encounter (HOSPITAL_COMMUNITY): Payer: Self-pay | Admitting: *Deleted

## 2017-09-12 HISTORY — PX: BIOPSY: SHX5522

## 2017-09-12 HISTORY — PX: ESOPHAGOGASTRODUODENOSCOPY (EGD) WITH PROPOFOL: SHX5813

## 2017-09-12 LAB — CBC
HCT: 29.4 % — ABNORMAL LOW (ref 36.0–46.0)
HCT: 29.3 % — ABNORMAL LOW (ref 36.0–46.0)
HEMATOCRIT: 30.5 % — AB (ref 36.0–46.0)
HEMOGLOBIN: 9.4 g/dL — AB (ref 12.0–15.0)
Hemoglobin: 9.3 g/dL — ABNORMAL LOW (ref 12.0–15.0)
Hemoglobin: 9.3 g/dL — ABNORMAL LOW (ref 12.0–15.0)
MCH: 24.9 pg — ABNORMAL LOW (ref 26.0–34.0)
MCH: 24.4 pg — ABNORMAL LOW (ref 26.0–34.0)
MCH: 24.8 pg — ABNORMAL LOW (ref 26.0–34.0)
MCHC: 31.6 g/dL (ref 30.0–36.0)
MCHC: 30.8 g/dL (ref 30.0–36.0)
MCHC: 31.7 g/dL (ref 30.0–36.0)
MCV: 78.6 fL (ref 78.0–100.0)
MCV: 79.2 fL (ref 78.0–100.0)
MCV: 78.1 fL (ref 78.0–100.0)
Platelets: 127 10*3/uL — ABNORMAL LOW (ref 150–400)
Platelets: 114 10*3/uL — ABNORMAL LOW (ref 150–400)
Platelets: 126 10*3/uL — ABNORMAL LOW (ref 150–400)
RBC: 3.74 MIL/uL — ABNORMAL LOW (ref 3.87–5.11)
RBC: 3.85 MIL/uL — AB (ref 3.87–5.11)
RBC: 3.75 MIL/uL — ABNORMAL LOW (ref 3.87–5.11)
RDW: 20.6 % — ABNORMAL HIGH (ref 11.5–15.5)
RDW: 20.9 % — ABNORMAL HIGH (ref 11.5–15.5)
RDW: 20.6 % — ABNORMAL HIGH (ref 11.5–15.5)
WBC: 2.8 10*3/uL — ABNORMAL LOW (ref 4.0–10.5)
WBC: 2.4 10*3/uL — AB (ref 4.0–10.5)
WBC: 2.8 10*3/uL — ABNORMAL LOW (ref 4.0–10.5)

## 2017-09-12 LAB — BASIC METABOLIC PANEL
Anion gap: 4 — ABNORMAL LOW (ref 5–15)
BUN: 7 mg/dL — AB (ref 8–23)
CO2: 21 mmol/L — ABNORMAL LOW (ref 22–32)
Calcium: 7 mg/dL — ABNORMAL LOW (ref 8.9–10.3)
Chloride: 112 mmol/L — ABNORMAL HIGH (ref 98–111)
Creatinine, Ser: 0.9 mg/dL (ref 0.44–1.00)
GFR calc Af Amer: >60 mL/min (ref >60)
Glucose, Bld: 81 mg/dL (ref 70–99)
POTASSIUM: 3.7 mmol/L (ref 3.5–5.1)
SODIUM: 137 mmol/L (ref 135–145)

## 2017-09-12 LAB — TYPE AND SCREEN
ABO/RH(D): A POS
ANTIBODY SCREEN: NEGATIVE
UNIT DIVISION: 0
Unit division: 0

## 2017-09-12 LAB — BPAM RBC
BLOOD PRODUCT EXPIRATION DATE: 201908102359
BLOOD PRODUCT EXPIRATION DATE: 201908102359
ISSUE DATE / TIME: 201907240209
ISSUE DATE / TIME: 201907231959
UNIT TYPE AND RH: 6200
Unit Type and Rh: 6200

## 2017-09-12 LAB — HAPTOGLOBIN: Haptoglobin: 79 mg/dL (ref 34–200)

## 2017-09-12 SURGERY — ESOPHAGOGASTRODUODENOSCOPY (EGD) WITH PROPOFOL
Anesthesia: Monitor Anesthesia Care | Laterality: Left

## 2017-09-12 MED ORDER — PROPOFOL 500 MG/50ML IV EMUL
INTRAVENOUS | Status: DC | PRN
  Administered 2017-09-12: 150 ug/kg/min via INTRAVENOUS

## 2017-09-12 MED ORDER — PROPOFOL 10 MG/ML IV BOLUS
INTRAVENOUS | Status: DC | PRN
  Administered 2017-09-12: 10 mg via INTRAVENOUS
  Administered 2017-09-12: 20 mg via INTRAVENOUS
  Administered 2017-09-12: 10 mg via INTRAVENOUS

## 2017-09-12 SURGICAL SUPPLY — 15 items

## 2017-09-12 NOTE — Anesthesia Postprocedure Evaluation (Signed)
Anesthesia Post Note  Patient: Michelle Boyd  Procedure(s) Performed: ESOPHAGOGASTRODUODENOSCOPY (EGD) WITH PROPOFOL (Left ) BIOPSY     Patient location during evaluation: PACU Anesthesia Type: MAC Level of consciousness: awake and alert Pain management: pain level controlled Vital Signs Assessment: post-procedure vital signs reviewed and stable Respiratory status: spontaneous breathing, nonlabored ventilation, respiratory function stable and patient connected to nasal cannula oxygen Cardiovascular status: stable and blood pressure returned to baseline Postop Assessment: no apparent nausea or vomiting Anesthetic complications: no    Last Vitals:  Vitals:   09/12/17 1320 09/12/17 1330  BP: (!) 154/78 (!) 163/69  Pulse: 85 76  Resp: 19 16  Temp:    SpO2: 97% 98%    Last Pain:  Vitals:   09/12/17 1311  TempSrc: Oral  PainSc:                  Allis Quirarte

## 2017-09-12 NOTE — Anesthesia Procedure Notes (Signed)
Procedure Name: MAC Date/Time: 09/12/2017 12:46 PM Performed by: Wilburn Cornelia, CRNA Pre-anesthesia Checklist: Patient identified, Emergency Drugs available, Suction available, Patient being monitored and Timeout performed Patient Re-evaluated:Patient Re-evaluated prior to induction Oxygen Delivery Method: Nasal cannula Placement Confirmation: positive ETCO2 and breath sounds checked- equal and bilateral Dental Injury: Teeth and Oropharynx as per pre-operative assessment

## 2017-09-12 NOTE — Progress Notes (Signed)
[facility]                                                                              Patient Demographics  Michelle Boyd, is a 67 y.o. female, DOB - 1950/08/22, WUJ:[id number]  Admit date - 09/10/2017   Admitting Physician Richarda Overlie, MD  Outpatient Primary MD for the patient is Hoy Register, MD  Outpatient specialists:   LOS - 2  days   Medical records reviewed and are as summarized below:    Chief Complaint  Patient presents with  . Joint Swelling  . Abdominal Pain       Brief summary   Patient is a 68 year old female with osteoarthritis, TIA, GERD, pancytopenia, hypertension, recent admission for symptomatic anemia, joint pain secondary to inflammatory versus infectious arthritis with oral Solu-Medrol.  During the previous admission, patient was found to have anemia, started on iron and B12 supplements but she could not tolerate iron pills.  Patient presented with increasing lower abdominal pain, generalized weakness, mild pica.  Also taking moderate amount of NSAIDs for joint pains. Hemoglobin 6.4 at the time of admission  Assessment & Plan    Principal Problem: Symptomatic anemia/pancytopenia with leukopenia, thrombocytopenia -Unclear etiology, patient had a colonoscopy a week ago which showed few small distal polyps which were removed and subsequently patient had melena which has resolved -GI consulted, plan for EGD today, n.p.o. -Patient noted noted to have pancytopenia, LDH mildly elevated 367, haptoglobin normal, reticulocyte count normal. -CT abdomen showed small amount of ascites in the abdomen but no focal abnormality, incidental right adnexal enlarging low-density structure 3.9 cm, may represent adnexal cyst -Follow EGD, if negative, will need hematology evaluation inpatient versus outpatient  Active Problems: Right adnexal cyst -Incidental finding on CT abdomen, pelvic ultrasound reviewed, consistent with simple appearing right ovarian  cyst, follow-up ultrasound in 1 year    Hypertension -Currently stable, continue hydralazine  Hypokalemia -Replace  Acute kidney injury -Creatinine mildly elevated 1.1, normalized to 0.9    Multiple joint pain -Currently no complaints, continue Neurontin     GERD (gastroesophageal reflux disease) -Continue PPI  Low-grade fever on 7/23 -Resolved, afebrile for last 24 hours -UA with hematuria but no UTI, CT abdomen showed normal kidneys, no hydronephrosis, bladder unremarkable -Blood cultures negative so far, procalcitonin negative, refrain from antibiotics for now  Code Status full code DVT Prophylaxis:   SCD's Family Communication: Discussed in detail with the patient, all imaging results, lab results explained to the patient and daughter at the bedside who assisted with translation   Disposition Plan:   Time Spent in minutes   25 minutes  Procedures:  CT abdomen  Consultants:   Gastroenterology  Antimicrobials:      Medications  Scheduled Meds: . [MAR Hold] gabapentin  100 mg Oral BID  . [MAR Hold] hydrALAZINE  50 mg Oral TID   Continuous Infusions: . sodium chloride 75 mL/hr at 09/11/17 1959  . pantoprozole (PROTONIX) infusion 8 mg/hr (09/11/17 1958)   PRN Meds:.[MAR Hold] acetaminophen **OR** [MAR Hold] acetaminophen, [MAR Hold] bisacodyl, [MAR Hold] HYDROcodone-acetaminophen, [MAR Hold]  morphine injection, [MAR Hold] ondansetron **OR** [MAR Hold] ondansetron (ZOFRAN) IV, [  MAR Hold] senna-docusate   Antibiotics   Anti-infectives (From admission, onward)   None        Subjective:   [name] was seen and examined today.  No fevers.  No nausea vomiting or abdominal pain.  No acute issues overnight.  Objective:   Vitals:   09/11/17 2015 09/12/17 0441 09/12/17 0904 09/12/17 1152  BP: (!) 148/67 134/68 129/62 (!) 161/75  Pulse: (!) 106 89 89 74  Resp: 16 16 18 15   Temp: 98.1 F (36.7 C) 98.2 F (36.8 C) 98.5 F (36.9 C) 97.8 F (36.6 C)    TempSrc: Oral Oral Oral Oral  SpO2: 92% 97% 98% 100%  Weight:        Intake/Output Summary (Last 24 hours) at 09/12/2017 1314 Last data filed at 09/12/2017 1311 Gross per 24 hour  Intake 1570 ml  Output 0 ml  Net 1570 ml     Wt Readings from Last 3 Encounters:  09/10/17 54.4 kg (119 lb 14.9 oz)  05/15/17 57.3 kg (126 lb 6.4 oz)  03/29/17 59.6 kg (131 lb 6.4 oz)     Exam   General: Alert and oriented x 3, NAD  Eyes:   HEENT:  Atraumatic, normocephalic,   Cardiovascular: S1 S2 auscultated,  Regular rate and rhythm. No pedal edema b/l  Respiratory: CTA B  Gastrointestinal: Soft, nontender, nondistended, + bowel sounds  Ext: no pedal edema bilaterally  Neuro: no new deficits  Musculoskeletal: No digital cyanosis, clubbing  Skin: No rashes  Psych: Normal affect and demeanor, alert and oriented x3    Data Reviewed:  I have personally reviewed following labs and imaging studies  Micro Results No results found for this or any previous visit (from the past 240 hour(s)).  Radiology Reports Ct Abdomen Pelvis W Contrast  Result Date: 09/10/2017 CLINICAL DATA:  67 year old with abdominal pain.  Bloody stool. EXAM: CT ABDOMEN AND PELVIS WITH CONTRAST TECHNIQUE: Multidetector CT imaging of the abdomen and pelvis was performed using the standard protocol following bolus administration of intravenous contrast. CONTRAST:  OMNIPAQUE IOHEXOL 300 MG/ML  SOLN COMPARISON:  10/22/2013 and 10/02/2013 FINDINGS: Lower chest: There are several small nodular densities at the lung bases. However, these appear to be stable from the exam on 10/22/2013. There is volume loss in the left lower lobe. No large pleural effusions. Hepatobiliary: Decreased attenuation of the liver and this could be related to steatosis. Small amount of perihepatic fluid along the inferior aspect of the liver. Main portal venous system is patent. Gallbladder appears to be surgically absent. Pancreas:  Unremarkable. No pancreatic ductal dilatation or surrounding inflammatory changes. Spleen: Normal in size without focal abnormality. Adrenals/Urinary Tract: Normal adrenal glands. Urinary bladder is unremarkable. Negative for hydronephrosis. Normal appearance of both kidneys. Probable cyst in the anterior mid left kidney. Stomach/Bowel: Normal appearance of the stomach and duodenum. The rectum and colon are decompressed and difficult to evaluate for wall thickening. There is no significant bowel dilatation. No focal bowel inflammation. Vascular/Lymphatic: Atherosclerotic disease in the abdominal aorta without aneurysm. The main visceral arteries are patent. No significant lymph node enlargement in the abdomen or pelvis. Reproductive: There is a 3.9 x 3.1 cm round low-density structure associated with the right adnexa. Previously, there was a 2.3 cm low-density structure in this area. No gross abnormality to the left adnexa. Normal appearance of the uterus. Other: Trace free fluid in the pelvis.  Negative for free air. Musculoskeletal: No acute bone abnormality. IMPRESSION: Small amount of ascites in the  abdomen and pelvis but no focal inflammation within the abdomen or pelvis. **An incidental finding of potential clinical significance has been found. Enlarging low-density structure that appears to be associated with the right adnexa. This structure measures up to 3.9 cm and may represent an adnexal cyst. Recommend follow-up pelvic ultrasound to further characterize the structure.** Electronically Signed   By: Richarda OverlieAdam  Henn M.D.   On: 09/10/2017 18:51   Dg Shoulder Left  Result Date: 09/10/2017 CLINICAL DATA:  Left shoulder pain, no injury EXAM: LEFT SHOULDER - 2+ VIEW COMPARISON:  None. FINDINGS: There is no evidence of fracture or dislocation. There is no evidence of arthropathy or other focal bone abnormality. Soft tissues are unremarkable. IMPRESSION: Negative. Electronically Signed   By: Marlan Palauharles  Clark M.D.    On: 09/10/2017 16:40   [place] Pelvic Complete With Transvaginal  Result Date: 09/11/2017 CLINICAL DATA:  Follow-up of right adnexal mass found on CT scan of July 23rd 2019 EXAM: TRANSABDOMINAL AND TRANSVAGINAL ULTRASOUND OF PELVIS TECHNIQUE: Both transabdominal and transvaginal ultrasound examinations of the pelvis were performed. Transabdominal technique was performed for global imaging of the pelvis including uterus, ovaries, adnexal regions, and pelvic cul-de-sac. It was necessary to proceed with endovaginal exam following the transabdominal exam to visualize the uterus, endometrium, ovaries, and adnexal structures. COMPARISON:  Abdominal and pelvic CT scan of September 10, 2017 FINDINGS: Uterus Measurements: 3.6 x 1.7 x 2.2 cm. No fibroids or other mass visualized. Endometrium Thickness: 3 mm.  No focal abnormality visualized. Right ovary Measurements: 4.3 x 3.2 x 4.3 cm. There is a 3.1 x 2.9 x 3.5 cm since simple appearing right ovarian cyst. There is no surrounding hypervascularity. Left ovary Measurements: The left ovary could not be visualized. Normal appearance/no adnexal mass. Other findings There is a small amount of free pelvic fluid. IMPRESSION: Simple appearing right ovarian cyst measuring 3.1 x 2.9 x 3.5 cm. No suspicious solid appearing adnexal mass on the right is observed. Follow-up ultrasound in 1 year is recommended. Nonvisualization of the left ovary. Normal appearance of the uterus and endometrium. Electronically Signed   By: David  SwazilandJordan M.D.   On: 09/11/2017 11:26    Lab Data:  CBC: Recent Labs  Lab 09/11/17 0055 09/11/17 0643 09/11/17 1521 09/11/17 2235 09/12/17 0733  WBC 2.9* 2.8* 3.4* 2.7* 2.8*  NEUTROABS  --  2.0  --   --   --   HGB 7.8* 9.7* 10.1* 9.5* 9.3*  HCT 25.2* 30.2* 32.0* 29.9* 29.4*  MCV 78.8 76.8* 77.1* 77.3* 78.6  PLT 129* 123* 132* 127* 127*   Basic Metabolic Panel: Recent Labs  Lab 09/10/17 1228 09/10/17 1846 09/11/17 0055 09/12/17 0733  NA 135  --   134* 137  K 3.1*  --  3.1* 3.7  CL 103  --  106 112*  CO2 24  --  23 21*  GLUCOSE 121*  --  146* 81  BUN 11  --  11 7*  CREATININE 1.14*  --  1.02* 0.90  CALCIUM 7.7*  --  7.1* 7.0*  MG  --  1.8  --   --    GFR: Estimated Creatinine Clearance: 44.4 mL/min (by C-G formula based on SCr of 0.9 mg/dL). Liver Function Tests: Recent Labs  Lab 09/10/17 1228 09/11/17 0055  AST 43* 34  ALT 14 12  ALKPHOS 79 71  BILITOT 0.8 1.0  PROT 7.0 6.1*  ALBUMIN 2.1* 1.8*   Recent Labs  Lab 09/10/17 1228  LIPASE 40   No results  for input(s): AMMONIA in the last 168 hours. Coagulation Profile: No results for input(s): INR, PROTIME in the last 168 hours. Cardiac Enzymes: No results for input(s): CKTOTAL, CKMB, CKMBINDEX, TROPONINI in the last 168 hours. BNP (last 3 results) No results for input(s): PROBNP in the last 8760 hours. HbA1C: No results for input(s): HGBA1C in the last 72 hours. CBG: No results for input(s): GLUCAP in the last 168 hours. Lipid Profile: No results for input(s): CHOL, HDL, LDLCALC, TRIG, CHOLHDL, LDLDIRECT in the last 72 hours. Thyroid Function Tests: No results for input(s): TSH, T4TOTAL, FREET4, T3FREE, THYROIDAB in the last 72 hours. Anemia Panel: Recent Labs    09/11/17 0709  RETICCTPCT 1.9   Urine analysis:    Component Value Date/Time   COLORURINE YELLOW 09/10/2017 1228   APPEARANCEUR HAZY (A) 09/10/2017 1228   LABSPEC 1.013 09/10/2017 1228   PHURINE 6.0 09/10/2017 1228   GLUCOSEU NEGATIVE 09/10/2017 1228   HGBUR LARGE (A) 09/10/2017 1228   BILIRUBINUR NEGATIVE 09/10/2017 1228   KETONESUR NEGATIVE 09/10/2017 1228   PROTEINUR NEGATIVE 09/10/2017 1228   UROBILINOGEN 1.0 10/22/2013 1100   NITRITE NEGATIVE 09/10/2017 1228   LEUKOCYTESUR NEGATIVE 09/10/2017 1228     Ripudeep Rai M.D. [facility] 09/12/2017, 1:14 PM  Pager: [phone] Between 7am to 7pm - call Pager - [phone]  After 7pm go to [email or web address] - password [email or web address]  Call  night coverage person covering after 7pm

## 2017-09-12 NOTE — Transfer of Care (Signed)
Immediate Anesthesia Transfer of Care Note  Patient: Michelle Boyd  Procedure(s) Performed: ESOPHAGOGASTRODUODENOSCOPY (EGD) WITH PROPOFOL (Left ) BIOPSY  Patient Location: Endoscopy Unit  Anesthesia Type:MAC  Level of Consciousness: awake, alert  and oriented  Airway & Oxygen Therapy: Patient Spontanous Breathing and Patient connected to nasal cannula oxygen  Post-op Assessment: Report given to RN and Post -op Vital signs reviewed and stable  Post vital signs: Reviewed and stable  Last Vitals:  Vitals Value Taken Time  BP 144/76 09/12/2017  1:10 PM  Temp    Pulse 92 09/12/2017  1:12 PM  Resp 18 09/12/2017  1:12 PM  SpO2 97 % 09/12/2017  1:12 PM  Vitals shown include unvalidated device data.  Last Pain:  Vitals:   09/12/17 1154  TempSrc:   PainSc: 0-No pain      Patients Stated Pain Goal: 0 (73/41/93 [id number])  Complications: No apparent anesthesia complications

## 2017-09-12 NOTE — Op Note (Signed)
[facility] Patient Name: Michelle Boyd Procedure Date : 09/12/2017 MRN: [id number] Attending MD: Shirley FriarVincent C Kelvon Giannini , MD Date of Birth: 07/19/1950 CSN: [id number] Age: 67 Admit Type: Inpatient Procedure:                Upper GI endoscopy Indications:              Suspected upper gastrointestinal bleeding, Iron                            deficiency anemia, Hematochezia Providers:                Shirley FriarVincent C. Javonn Gauger, MD, Dwain SarnaPatricia Ford, RN, Margo AyeValeria                            McKoy, Technician Referring MD:             hospital team Medicines:                Propofol per Anesthesia, Monitored Anesthesia Care Complications:            No immediate complications. Estimated Blood Loss:     Estimated blood loss was minimal. Procedure:                Pre-Anesthesia Assessment:                           - Prior to the procedure, a History and Physical                            was performed, and patient medications and                            allergies were reviewed. The patient's tolerance of                            previous anesthesia was also reviewed. The risks                            and benefits of the procedure and the sedation                            options and risks were discussed with the patient.                            All questions were answered, and informed consent                            was obtained. Prior Anticoagulants: The patient has                            taken no previous anticoagulant or antiplatelet                            agents. ASA Grade Assessment: III - A patient with  severe systemic disease. After reviewing the risks                            and benefits, the patient was deemed in                            satisfactory condition to undergo the procedure.                           After obtaining informed consent, the endoscope was                            passed under direct vision. Throughout  the                            procedure, the patient's blood pressure, pulse, and                            oxygen saturations were monitored continuously. The                            GIF-H190 ([id number]) Olympus Adult EGD was introduced                            through the mouth, and advanced to the second part                            of duodenum. The upper GI endoscopy was somewhat                            difficult due to the patient's oxygen desaturation.                            Successful completion of the procedure was aided by                            straightening and shortening the scope to obtain                            bowel loop reduction and managing the patient's                            medical instability. The patient tolerated the                            procedure. Scope In: Scope Out: Findings:      The examined esophagus was normal.      One oozing cratered gastric ulcer was found in the prepyloric region of       the stomach. The lesion was 14 mm in largest dimension.      Segmental moderate inflammation characterized by congestion (edema) and       erythema was found in the gastric antrum. Biopsies were taken with a       cold forceps for histology.  Estimated blood loss was minimal.      The cardia and gastric fundus were normal on retroflexion.      The examined duodenum was normal. Impression:               - Normal esophagus.                           - Oozing gastric ulcer at edge of ulcer that                            spontaneously stopped during the procedure.                           - Acute gastritis. Biopsied.                           - Normal examined duodenum. Recommendation:           - Clear liquid diet.                           - Await pathology results.                           - Give Protonix (pantoprazole): 8 mg/hr IV by                            continuous infusion.                           - Hematology Consult for  pancytopenia. Procedure Code(s):        --- Professional ---                           [phone], Esophagogastroduodenoscopy, flexible,                            transoral; with biopsy, single or multiple Diagnosis Code(s):        --- Professional ---                           D50.9, Iron deficiency anemia, unspecified                           K92.1, Melena (includes Hematochezia)                           K29.00, Acute gastritis without bleeding                           K25.4, Chronic or unspecified gastric ulcer with                            hemorrhage CPT copyright 2017 [employer] Medical Association. All rights reserved. The codes documented in this report are preliminary and upon coder review may  be revised to meet current compliance requirements. Shirley Friar, MD 09/12/2017 1:17:25 PM This report has been signed electronically. Number of Addenda: 0

## 2017-09-12 NOTE — Interval H&P Note (Signed)
History and Physical Interval Note:  09/12/2017 12:04 PM  Michelle Boyd  has presented today for surgery, with the diagnosis of anemia, blood in stool  The various methods of treatment have been discussed with the patient and family. After consideration of risks, benefits and other options for treatment, the patient has consented to  Procedure(s): ESOPHAGOGASTRODUODENOSCOPY (EGD) WITH PROPOFOL (Left) as a surgical intervention .  The patient's history has been reviewed, patient examined, no change in status, stable for surgery.  I have reviewed the patient's chart and labs.  Questions were answered to the patient's satisfaction.     Desmen Schoffstall C.

## 2017-09-12 NOTE — Brief Op Note (Addendum)
Medium-sized cratered distal gastric ulcer with oozing of blood that spontaneously stopped. Biopsies taken. Continue Protonix drip. Clear liquid diet. Needs outpt hematology consult because ulcer does not explain pancytopenia. D/W son. Dr. Randa EvensEdwards to see tomorrow.

## 2017-09-12 NOTE — Anesthesia Preprocedure Evaluation (Addendum)
Anesthesia Evaluation  Patient identified by MRN, date of birth, ID band Patient awake    Reviewed: Allergy & Precautions, H&P , NPO status , Patient's Chart, lab work & pertinent test results  Airway Mallampati: II  TM Distance: >3 FB Neck ROM: Full    Dental no notable dental hx. (+) Teeth Intact, Dental Advisory Given   Pulmonary    Pulmonary exam normal breath sounds clear to auscultation       Cardiovascular hypertension, On Medications  Rhythm:Regular Rate:Normal + Systolic murmurs    Neuro/Psych  Headaches, TIA   GI/Hepatic GERD  Medicated and Controlled,  Endo/Other    Renal/GU Renal disease     Musculoskeletal  (+) Arthritis , Osteoarthritis,    Abdominal   Peds  Hematology  (+) anemia ,   Anesthesia Other Findings   Reproductive/Obstetrics negative OB ROS                            Anesthesia Physical  Anesthesia Plan  ASA: III  Anesthesia Plan: MAC   Post-op Pain Management:    Induction: Intravenous  PONV Risk Score and Plan: 2 and Treatment may vary due to age or medical condition  Airway Management Planned: Nasal Cannula, Natural Airway and Mask  Additional Equipment:   Intra-op Plan:   Post-operative Plan:   Informed Consent: I have reviewed the patients History and Physical, chart, labs and discussed the procedure including the risks, benefits and alternatives for the proposed anesthesia with the patient or authorized representative who has indicated his/her understanding and acceptance.   Dental advisory given  Plan Discussed with: CRNA, Surgeon and Anesthesiologist  Anesthesia Plan Comments: (Discussed plan, including risks through interpreter.)       Anesthesia Quick Evaluation

## 2017-09-13 DIAGNOSIS — N9489 Other specified conditions associated with female genital organs and menstrual cycle: Secondary | ICD-10-CM

## 2017-09-13 DIAGNOSIS — R748 Abnormal levels of other serum enzymes: Secondary | ICD-10-CM

## 2017-09-13 LAB — BASIC METABOLIC PANEL
Anion gap: 5 (ref 5–15)
BUN: 12 mg/dL (ref 8–23)
CO2: 22 mmol/L (ref 22–32)
Calcium: 7 mg/dL — ABNORMAL LOW (ref 8.9–10.3)
Chloride: 111 mmol/L (ref 98–111)
Creatinine, Ser: 0.88 mg/dL (ref 0.44–1.00)
GFR calc Af Amer: >60 mL/min (ref >60)
Glucose, Bld: 97 mg/dL (ref 70–99)
POTASSIUM: 3.2 mmol/L — AB (ref 3.5–5.1)
SODIUM: 138 mmol/L (ref 135–145)

## 2017-09-13 MED ORDER — POTASSIUM CHLORIDE CRYS ER 20 MEQ PO TBCR
40.0000 meq | EXTENDED_RELEASE_TABLET | Freq: Once | ORAL | Status: AC
  Administered 2017-09-13: 40 meq via ORAL
  Filled 2017-09-13: qty 2

## 2017-09-13 NOTE — Progress Notes (Signed)
Patient had a BM this PM. Liquid and black in color.

## 2017-09-13 NOTE — Progress Notes (Signed)
PROGRESS NOTE    Michelle Boyd  ZOX:[id number] DOB: July 30, 1950 DOA: 09/10/2017 PCP: Hoy Register, MD  Brief Narrative: 67 year old female with osteoarthritis, TIA, GERD, pancytopenia, hypertension, recent admission for symptomatic anemia, joint pain secondary to inflammatory versus infectious arthritis with oral Solu-Medrol.  During the previous admission, patient was found to have anemia, started on iron and B12 supplements but she could not tolerate iron pills.  Patient presented with increasing lower abdominal pain, generalized weakness, mild pica.  Also taking moderate amount of NSAIDs for joint pains. Hemoglobin 6.4 at the time of admission    Assessment & Plan:   Principal Problem:   Anemia Active Problems:   Hypertension   Non-traumatic compression fracture of L3 & L4 lumbar vertebra   Iron (Fe) deficiency anemia   Multiple joint pain   GERD (gastroesophageal reflux disease)   Abdominal pain  Symptomatic anemia/pancytopenia with leukopenia, thrombocytopenia -Unclear etiology, patient had a colonoscopy a week ago which showed few small distal polyps which were removed and subsequently patient had melena which has resolved - EGD large gastric ulcer,if h and h remained stable patient will be discharged home tomorrow. -Patient noted noted to have pancytopenia, LDH mildly elevated 367, haptoglobin normal, reticulocyte count normal. -CT abdomen showed small amount of ascites in the abdomen but no focal abnormality, incidental right adnexal enlarging low-density structure 3.9 cm, may represent adnexal cyst - will need hematology evaluation inpatient versus outpatient  Active Problems: Right adnexal cyst -Incidental finding on CT abdomen, pelvic ultrasound reviewed, consistent with simple appearing right ovarian cyst, follow-up ultrasound in 1 year    Hypertension -Currently stable, continue hydralazine  Hypokalemia -Replace  Acute kidney injury -Creatinine mildly elevated  1.1, normalized to 0.9    Multiple joint pain -Currently no complaints, continue Neurontin     GERD (gastroesophageal reflux disease) -Continue PPI  Low-grade fever on 7/23 -Resolved, afebrile for last 24 hours -UA with hematuria but no UTI, CT abdomen showed normal kidneys, no hydronephrosis, bladder unremarkable -Blood cultures negative so far, procalcitonin negative, refrain from antibiotics for now  hypokalemia replete     DVT prophylaxis SCD Code Status: Full code Family Communication: Discussed with daughter who was in the room Disposition Plan plan for discharge tomorrow if hemoglobin stable Consultants: GI  Procedures: EGD 09/12/2017 Antimicrobials: None  Subjective: Sitting up in bed family by the bedside denies any nausea vomiting or diarrhea or black stools.   Objective: Vitals:   09/12/17 1746 09/12/17 2048 09/13/17 0605 09/13/17 0844  BP: (!) 156/72 (!) 159/76 (!) 139/58 (!) 151/80  Pulse: 82 97 97 88  Resp: 18 16 18 18   Temp: 98 F (36.7 C) 98.1 F (36.7 C) 98.4 F (36.9 C) 98.4 F (36.9 C)  TempSrc: Oral Oral Oral Oral  SpO2: 98% 95% 95% 97%  Weight:        Intake/Output Summary (Last 24 hours) at 09/13/2017 1429 Last data filed at 09/13/2017 1300 Gross per 24 hour  Intake 2685.12 ml  Output 0 ml  Net 2685.12 ml   Filed Weights   09/10/17 1937  Weight: 54.4 kg (119 lb 14.9 oz)    Examination:  General exam: Appears calm and comfortable  Respiratory system: Clear to auscultation. Respiratory effort normal. Cardiovascular system: S1 & S2 heard, RRR. No JVD, murmurs, rubs, gallops or clicks. No pedal edema. Gastrointestinal system: Abdomen is nondistended, soft and nontender. No organomegaly or masses felt. Normal bowel sounds heard. Central nervous system: Alert and oriented. No focal neurological deficits. Extremities: Symmetric 5  x 5 power. Skin: No rashes, lesions or ulcers Psychiatry: Judgement and insight appear normal. Mood &  affect appropriate.     Data Reviewed: I have personally reviewed following labs and imaging studies  CBC: Recent Labs  Lab 09/11/17 0643 09/11/17 1521 09/11/17 2235 09/12/17 0733 09/12/17 1515 09/12/17 2133  WBC 2.8* 3.4* 2.7* 2.8* 2.4* 2.8*  NEUTROABS 2.0  --   --   --   --   --   HGB 9.7* 10.1* 9.5* 9.3* 9.4* 9.3*  HCT 30.2* 32.0* 29.9* 29.4* 30.5* 29.3*  MCV 76.8* 77.1* 77.3* 78.6 79.2 78.1  PLT 123* 132* 127* 127* 114* 126*   Basic Metabolic Panel: Recent Labs  Lab 09/10/17 1228 09/10/17 1846 09/11/17 0055 09/12/17 0733 09/13/17 0607  NA 135  --  134* 137 138  K 3.1*  --  3.1* 3.7 3.2*  CL 103  --  106 112* 111  CO2 24  --  23 21* 22  GLUCOSE 121*  --  146* 81 97  BUN 11  --  11 7* 12  CREATININE 1.14*  --  1.02* 0.90 0.88  CALCIUM 7.7*  --  7.1* 7.0* 7.0*  MG  --  1.8  --   --   --    GFR: Estimated Creatinine Clearance: 45.4 mL/min (by C-G formula based on SCr of 0.88 mg/dL). Liver Function Tests: Recent Labs  Lab 09/10/17 1228 09/11/17 0055  AST 43* 34  ALT 14 12  ALKPHOS 79 71  BILITOT 0.8 1.0  PROT 7.0 6.1*  ALBUMIN 2.1* 1.8*   Recent Labs  Lab 09/10/17 1228  LIPASE 40   No results for input(s): AMMONIA in the last 168 hours. Coagulation Profile: No results for input(s): INR, PROTIME in the last 168 hours. Cardiac Enzymes: No results for input(s): CKTOTAL, CKMB, CKMBINDEX, TROPONINI in the last 168 hours. BNP (last 3 results) No results for input(s): PROBNP in the last 8760 hours. HbA1C: No results for input(s): HGBA1C in the last 72 hours. CBG: No results for input(s): GLUCAP in the last 168 hours. Lipid Profile: No results for input(s): CHOL, HDL, LDLCALC, TRIG, CHOLHDL, LDLDIRECT in the last 72 hours. Thyroid Function Tests: No results for input(s): TSH, T4TOTAL, FREET4, T3FREE, THYROIDAB in the last 72 hours. Anemia Panel: Recent Labs    09/11/17 0709  RETICCTPCT 1.9   Sepsis Labs: Recent Labs  Lab 09/11/17 1240    PROCALCITON <0.10    Recent Results (from the past 240 hour(s))  Culture, blood (routine x 2)     Status: None (Preliminary result)   Collection Time: 09/11/17 12:32 PM  Result Value Ref Range Status   Specimen Description BLOOD RIGHT HAND  Final   Special Requests   Final    BOTTLES DRAWN AEROBIC ONLY Blood Culture adequate volume   Culture   Final    NO GROWTH 2 DAYS Performed at [facility], [address], [place], [place] [address]    Report Status PENDING  Incomplete  Culture, blood (routine x 2)     Status: None (Preliminary result)   Collection Time: 09/11/17 12:41 PM  Result Value Ref Range Status   Specimen Description BLOOD LEFT ANTECUBITAL  Final   Special Requests   Final    BOTTLES DRAWN AEROBIC ONLY Blood Culture adequate volume   Culture   Final    NO GROWTH 2 DAYS Performed at [facility], [address] [place], [place] [address]    Report Status  PENDING  Incomplete         Radiology Studies: No results found.      Scheduled Meds: . gabapentin  100 mg Oral BID  . hydrALAZINE  50 mg Oral TID  . potassium chloride  40 mEq Oral Once   Continuous Infusions: . sodium chloride 75 mL/hr at 09/12/17 2037  . pantoprozole (PROTONIX) infusion 8 mg/hr (09/13/17 0645)     LOS: 3 days    Alwyn RenElizabeth G Mathews, MD [facility]  If 7PM-7AM, please contact night-coverage [email or web address] Password [email or web address] 09/13/2017, 2:29 PM

## 2017-09-13 NOTE — Progress Notes (Signed)
[facility] PROGRESS NOTE Subjective No specific complaints.  Does not speak English well.  When asked if she was still bleeding, she shook her head no. Objective: Vital signs in last 24 hours: Temp:  [98 F (36.7 C)-98.4 F (36.9 C)] 98.4 F (36.9 C) (07/26 0844) Pulse Rate:  [76-97] 88 (07/26 0844) Resp:  [16-19] 18 (07/26 0844) BP: (139-163)/(58-80) 151/80 (07/26 0844) SpO2:  [95 %-98 %] 97 % (07/26 0844) Last BM Date: 09/12/17  Intake/Output from previous day: 07/25 0701 - 07/26 0700 In: 2395.2 [P.O.:600; I.V.:1795.2] Out: 0  Intake/Output this shift: Total I/O In: 479.9 [P.O.:180; I.V.:299.9] Out: -   PE:  Abdomen-- nontender  Lab Results: Recent Labs    09/11/17 1521 09/11/17 2235 09/12/17 0733 09/12/17 1515 09/12/17 2133  WBC 3.4* 2.7* 2.8* 2.4* 2.8*  HGB 10.1* 9.5* 9.3* 9.4* 9.3*  HCT 32.0* 29.9* 29.4* 30.5* 29.3*  PLT 132* 127* 127* 114* 126*   BMET Recent Labs    09/10/17 1228 09/11/17 0055 09/12/17 0733 09/13/17 0607  NA 135 134* 137 138  K 3.1* 3.1* 3.7 3.2*  CL 103 106 112* 111  CO2 24 23 21* 22  CREATININE 1.14* 1.02* 0.90 0.88   LFT Recent Labs    09/10/17 1228 09/11/17 0055  PROT 7.0 6.1*  AST 43* 34  ALT 14 12  ALKPHOS 79 71  BILITOT 0.8 1.0   PT/INR No results for input(s): LABPROT, INR in the last 72 hours. PANCREAS Recent Labs    09/10/17 1228  LIPASE 40         Studies/Results: No results found.  Medications: I have reviewed the patient's current medications.  Assessment:   1.  Large gastric ulcer.  Bleeding appears to have stopped   Plan: If hemoglobin is continued to be stable tomorrow we will change her over to full liquid diet and oral PPI.     Tresea MallJames L Alean Kromer 09/13/2017, 11:52 AM  This note was created using voice recognition software. Minor errors may Have occurred unintentionally.  Pager: [phone] If no answer or after hours call [phone]

## 2017-09-13 NOTE — Progress Notes (Signed)
Physical Therapy Treatment Patient Details Name: Michelle Boyd MRN: [id number] DOB: 10/28/1950 Today's Date: 09/13/2017    History of Present Illness 67 y.o. [place] female with medical history significant for osteoarthritis, TIA, GERD, pancytopenia, history of essentially hypertension, prior history of TIA, and recent admission for symptomatic anemia, presents  with worsening anemia and hx of melena for 3 days , weakness . She is found to have a hg of 7.4 lower than her recent admission , has been taking naprosen for left shoulder pain.    PT Comments    Patient is progressing very well towards their physical therapy goals. Demonstrates improved activity tolerance as evidenced by increased ambulation distance to 100 feet using cane and min guard assist. Continues to demonstrate mild unsteadiness but becoming more independent. D/c plan remains appropriate.    Follow Up Recommendations  Home health PT;Supervision for mobility/OOB     Equipment Recommendations  None recommended by PT    Recommendations for Other Services       Precautions / Restrictions Precautions Precautions: Fall Restrictions Weight Bearing Restrictions: No    Mobility  Bed Mobility Overal bed mobility: Modified Independent Bed Mobility: Sit to Supine;Supine to Sit              Transfers Overall transfer level: Modified independent Equipment used: Straight cane;None Transfers: Sit to/from Stand Sit to Stand: Supervision         General transfer comment: ModI for transfers from toilet and edge of bed  Ambulation/Gait Ambulation/Gait assistance: Min guard Gait Distance (Feet): 100 Feet Assistive device: Straight cane Gait Pattern/deviations: Decreased stride length;Narrow base of support;Step-through pattern Gait velocity: decreased   General Gait Details: Patient with improved fluidity of gait this session. did demonstrate one minor loss of balance posteriorly with obstacle  negotiation/distractions. Offered to adjust height of her personal cane appropriately but patient declined.   Stairs             Wheelchair Mobility    Modified Rankin (Stroke Patients Only)       Balance Overall balance assessment: Needs assistance Sitting-balance support: No upper extremity supported;Feet supported Sitting balance-Leahy Scale: Good     Standing balance support: No upper extremity supported;During functional activity Standing balance-Leahy Scale: Fair                              Cognition Arousal/Alertness: Awake/alert Behavior During Therapy: WFL for tasks assessed/performed Overall Cognitive Status: Within Functional Limits for tasks assessed                                        Exercises      General Comments General comments (skin integrity, edema, etc.): Nepali Stratus interpreter utilized      Pertinent Vitals/Pain Pain Assessment: Faces Faces Pain Scale: Hurts a little bit Pain Location: abdomen Pain Descriptors / Indicators: Discomfort Pain Intervention(s): Monitored during session    Home Living                      Prior Function            PT Goals (current goals can now be found in the care plan section) Acute Rehab PT Goals Patient Stated Goal: "walk more." PT Goal Formulation: With patient Time For Goal Achievement: 09/25/17 Potential to Achieve Goals: Good Progress towards PT goals: Progressing toward  goals    Frequency    Min 3X/week      PT Plan Current plan remains appropriate    Co-evaluation              AM-PAC PT "6 Clicks" Daily Activity  Outcome Measure  Difficulty turning over in bed (including adjusting bedclothes, sheets and blankets)?: None Difficulty moving from lying on back to sitting on the side of the bed? : None Difficulty sitting down on and standing up from a chair with arms (e.g., wheelchair, bedside commode, etc,.)?: None Help needed  moving to and from a bed to chair (including a wheelchair)?: A Little Help needed walking in hospital room?: A Little Help needed climbing 3-5 steps with a railing? : A Little 6 Click Score: 21    End of Session Equipment Utilized During Treatment: Gait belt Activity Tolerance: Patient tolerated treatment well Patient left: in bed;with call bell/phone within reach Nurse Communication: Mobility status PT Visit Diagnosis: Unsteadiness on feet (R26.81);Muscle weakness (generalized) (M62.81)     Time: 1610-9604 PT Time Calculation (min) (ACUTE ONLY): 23 min  Charges:  $Therapeutic Activity: 23-37 mins                    Laurina Bustle, PT, DPT Acute Rehabilitation Services  Pager: [phone]    Vanetta Mulders 09/13/2017, 4:09 PM

## 2017-09-13 NOTE — Progress Notes (Signed)
CRITICAL VALUE ALERT  Critical Value:  k 3.2  Date & Time Notied:  09/13/17 1015  Provider Notified: Yes  Orders Received/Actions taken: None at this time

## 2017-09-14 LAB — CBC WITH DIFFERENTIAL/PLATELET
Abs Immature Granulocytes: 0 10*3/uL (ref 0.0–0.1)
Basophils Absolute: 0 10*3/uL (ref 0.0–0.1)
Basophils Relative: 0 %
EOS PCT: 1 %
Eosinophils Absolute: 0 10*3/uL (ref 0.0–0.7)
HEMATOCRIT: 25.8 % — AB (ref 36.0–46.0)
HEMOGLOBIN: 8.1 g/dL — AB (ref 12.0–15.0)
Immature Granulocytes: 0 %
LYMPHS ABS: 0.6 10*3/uL — AB (ref 0.7–4.0)
LYMPHS PCT: 24 %
MCH: 24.6 pg — ABNORMAL LOW (ref 26.0–34.0)
MCHC: 31.4 g/dL (ref 30.0–36.0)
MCV: 78.4 fL (ref 78.0–100.0)
MONO ABS: 0.1 10*3/uL (ref 0.1–1.0)
Monocytes Relative: 4 %
Neutro Abs: 1.6 10*3/uL — ABNORMAL LOW (ref 1.7–7.7)
Neutrophils Relative %: 69 %
Platelets: 129 10*3/uL — ABNORMAL LOW (ref 150–400)
RBC: 3.29 MIL/uL — AB (ref 3.87–5.11)
RDW: 20.7 % — ABNORMAL HIGH (ref 11.5–15.5)
WBC: 2.3 10*3/uL — AB (ref 4.0–10.5)

## 2017-09-14 LAB — BASIC METABOLIC PANEL
ANION GAP: 6 (ref 5–15)
BUN: 13 mg/dL (ref 8–23)
CO2: 20 mmol/L — ABNORMAL LOW (ref 22–32)
Calcium: 7.2 mg/dL — ABNORMAL LOW (ref 8.9–10.3)
Chloride: 112 mmol/L — ABNORMAL HIGH (ref 98–111)
Creatinine, Ser: 0.88 mg/dL (ref 0.44–1.00)
GFR calc non Af Amer: >60 mL/min (ref >60)
GLUCOSE: 86 mg/dL (ref 70–99)
Potassium: 3.1 mmol/L — ABNORMAL LOW (ref 3.5–5.1)
SODIUM: 138 mmol/L (ref 135–145)

## 2017-09-14 MED ORDER — POTASSIUM CHLORIDE 10 MEQ/100ML IV SOLN
10.0000 meq | INTRAVENOUS | Status: AC
Start: 2017-09-14 — End: 2017-09-14
  Administered 2017-09-14 (×2): 10 meq via INTRAVENOUS
  Filled 2017-09-14 (×2): qty 100

## 2017-09-14 MED ORDER — PANTOPRAZOLE SODIUM 40 MG PO TBEC
40.0000 mg | DELAYED_RELEASE_TABLET | Freq: Two times a day (BID) | ORAL | Status: DC
  Administered 2017-09-14 – 2017-09-15 (×4): 40 mg via ORAL
  Filled 2017-09-14 (×4): qty 1

## 2017-09-14 MED ORDER — POTASSIUM CHLORIDE CRYS ER 20 MEQ PO TBCR
40.0000 meq | EXTENDED_RELEASE_TABLET | Freq: Once | ORAL | Status: AC
  Administered 2017-09-14: 40 meq via ORAL
  Filled 2017-09-14: qty 2

## 2017-09-14 NOTE — Progress Notes (Signed)
[facility] PROGRESS NOTE Subjective Patient without gross bleeding per her husband who speaks English quite well. She's not had any complaints.remains on clear liquids.  Objective: Vital signs in last 24 hours: Temp:  [98.3 F (36.8 C)-99.2 F (37.3 C)] 98.7 F (37.1 C) (07/27 0522) Pulse Rate:  [88-115] 115 (07/27 0522) Resp:  [18-20] 20 (07/27 0522) BP: (151-164)/(77-95) 162/95 (07/27 0522) SpO2:  [91 %-97 %] 91 % (07/27 0522) Weight:  [58.6 kg (129 lb 3 oz)] 58.6 kg (129 lb 3 oz) (07/27 0220) Last BM Date: 09/13/17  Intake/Output from previous day: 07/26 0701 - 07/27 0700 In: 2744 [P.O.:500; I.V.:2244] Out: 0  Intake/Output this shift: No intake/output data recorded.  PE: General--no obvious distress   Lab Results: Recent Labs    09/11/17 2235 09/12/17 0733 09/12/17 1515 09/12/17 2133 09/14/17 0611  WBC 2.7* 2.8* 2.4* 2.8* 2.3*  HGB 9.5* 9.3* 9.4* 9.3* 8.1*  HCT 29.9* 29.4* 30.5* 29.3* 25.8*  PLT 127* 127* 114* 126* 129*   BMET Recent Labs    09/12/17 0733 09/13/17 0607 09/14/17 0611  NA 137 138 138  K 3.7 3.2* 3.1*  CL 112* 111 112*  CO2 21* 22 20*  CREATININE 0.90 0.88 0.88   LFT No results for input(s): PROT, AST, ALT, ALKPHOS, BILITOT, BILIDIR, IBILI in the last 72 hours. PT/INR No results for input(s): LABPROT, INR in the last 72 hours. PANCREAS No results for input(s): LIPASE in the last 72 hours.       Studies/Results: No results found.  Medications: I have reviewed the patient's current medications.  Assessment:   1. Bleeding gastric ulcer. Patients Protonix infusion was stopped last night. Oral Protonix somehow did not get ordered. Hemoglobin is stable   Plan: 1. Will start on Protonix 40 mg BID. Would continue clear liquids in case repeat EGD needed. If continues to do well hopefully can be discharged soon   Tresea MallJames L Traxton Kolenda 09/14/2017, 7:45 AM  This note was created using voice recognition software. Minor errors  may Have occurred unintentionally.  Pager: [phone] If no answer or after hours call [phone]

## 2017-09-14 NOTE — Progress Notes (Signed)
PROGRESS NOTE    Michelle Boyd  YNW:[id number] DOB: 11/01/1950 DOA: 09/10/2017 PCP: Hoy RegisterNewlin, Enobong, MD   Brief Narrative: 67 year old female with osteoarthritis, TIA, GERD, pancytopenia, hypertension, recent admission for symptomatic anemia, joint pain secondary to inflammatory versus infectious arthritis with oral Solu-Medrol. During the previous admission, patient was found to have anemia, started on iron and B12 supplements but she could not tolerate iron pills. Patient presented with increasing lower abdominal pain, generalized weakness, mild pica. Also taking moderate amount of NSAIDs for joint pains. Hemoglobin 6.4 at the time of admission     Assessment & Plan:   Principal Problem:   Acute anemia Active Problems:   Hypertension   Non-traumatic compression fracture of L3 & L4 lumbar vertebra   Iron (Fe) deficiency anemia   Multiple joint pain   GERD (gastroesophageal reflux disease)   Abdominal pain   Adnexal mass   Elevated creatine kinase  Symptomatic anemia/pancytopenia with leukopenia, thrombocytopenia -Unclear etiology, patient had a colonoscopy a week ago which showed few small distal polyps which were removed and subsequently patient had melena which has resolved - EGD large gastric ulcer drop in hemoglobin to 8.1 from 9.3 yesterday with ongoing black stools.  GI following.  Continue Protonix. -Patient noted noted to have pancytopenia, LDH mildly elevated 367,haptoglobin normal, reticulocyte count normal. -CT abdomen showed small amount of ascites in the abdomen but no focal abnormality, incidental right adnexal enlarging low-density structure 3.9 cm, may represent adnexal cyst - will need hematology evaluation inpatient versus outpatient  Active Problems: Right adnexal cyst -Incidental finding on CT abdomen,pelvic ultrasound reviewed, consistent with simple appearing right ovarian cyst, follow-up ultrasound in 1 year  Hypertension -Currently stable, continue  hydralazine  Hypokalemia -Replace  Acute kidney injury -Creatinine mildly elevated 1.1, normalized to 0.9        DVT prophylaxis SCD Code Status: Full code family Communication: Discussed with son who was in the room Disposition Plan: Plan discharge if H&H remains stable tomorrow   Consultants: GI    Procedures EGD Antimicrobials: None  Subjective: Feels okay still having black stools denies any dizziness nausea vomiting abdominal pain diarrhea or constipation.   Objective: Vitals:   09/13/17 2023 09/14/17 0220 09/14/17 0522 09/14/17 1018  BP: (!) 164/81  (!) 162/95 (!) 156/88  Pulse: (!) 108  (!) 115 (!) 106  Resp: 20  20 18   Temp: 99.2 F (37.3 C)  98.7 F (37.1 C) 98.2 F (36.8 C)  TempSrc: Oral  Oral Oral  SpO2: 96%  91% 96%  Weight:  58.6 kg (129 lb 3 oz)      Intake/Output Summary (Last 24 hours) at 09/14/2017 1143 Last data filed at 09/14/2017 1021 Gross per 24 hour  Intake 2624.09 ml  Output 0 ml  Net 2624.09 ml   Filed Weights   09/10/17 1937 09/14/17 0220  Weight: 54.4 kg (119 lb 14.9 oz) 58.6 kg (129 lb 3 oz)    Examination:  General exam: Appears calm and comfortable  Respiratory system: Clear to auscultation. Respiratory effort normal. Cardiovascular system: S1 & S2 heard, RRR. No JVD, murmurs, rubs, gallops or clicks. No pedal edema. Gastrointestinal system: Abdomen is nondistended, soft and nontender. No organomegaly or masses felt. Normal bowel sounds heard. Central nervous system: Alert and oriented. No focal neurological deficits. Extremities: Symmetric 5 x 5 power. Skin: No rashes, lesions or ulcers Psychiatry: Judgement and insight appear normal. Mood & affect appropriate.     Data Reviewed: I have personally reviewed following labs and imaging  studies  CBC: Recent Labs  Lab 09/11/17 0643  09/11/17 2235 09/12/17 0733 09/12/17 1515 09/12/17 2133 09/14/17 0611  WBC 2.8*   < > 2.7* 2.8* 2.4* 2.8* 2.3*  NEUTROABS 2.0   --   --   --   --   --  1.6*  HGB 9.7*   < > 9.5* 9.3* 9.4* 9.3* 8.1*  HCT 30.2*   < > 29.9* 29.4* 30.5* 29.3* 25.8*  MCV 76.8*   < > 77.3* 78.6 79.2 78.1 78.4  PLT 123*   < > 127* 127* 114* 126* 129*   < > = values in this interval not displayed.   Basic Metabolic Panel: Recent Labs  Lab 09/10/17 1228 09/10/17 1846 09/11/17 0055 09/12/17 0733 09/13/17 0607 09/14/17 0611  NA 135  --  134* 137 138 138  K 3.1*  --  3.1* 3.7 3.2* 3.1*  CL 103  --  106 112* 111 112*  CO2 24  --  23 21* 22 20*  GLUCOSE 121*  --  146* 81 97 86  BUN 11  --  11 7* 12 13  CREATININE 1.14*  --  1.02* 0.90 0.88 0.88  CALCIUM 7.7*  --  7.1* 7.0* 7.0* 7.2*  MG  --  1.8  --   --   --   --    GFR: Estimated Creatinine Clearance: 47.1 mL/min (by C-G formula based on SCr of 0.88 mg/dL). Liver Function Tests: Recent Labs  Lab 09/10/17 1228 09/11/17 0055  AST 43* 34  ALT 14 12  ALKPHOS 79 71  BILITOT 0.8 1.0  PROT 7.0 6.1*  ALBUMIN 2.1* 1.8*   Recent Labs  Lab 09/10/17 1228  LIPASE 40   No results for input(s): AMMONIA in the last 168 hours. Coagulation Profile: No results for input(s): INR, PROTIME in the last 168 hours. Cardiac Enzymes: No results for input(s): CKTOTAL, CKMB, CKMBINDEX, TROPONINI in the last 168 hours. BNP (last 3 results) No results for input(s): PROBNP in the last 8760 hours. HbA1C: No results for input(s): HGBA1C in the last 72 hours. CBG: No results for input(s): GLUCAP in the last 168 hours. Lipid Profile: No results for input(s): CHOL, HDL, LDLCALC, TRIG, CHOLHDL, LDLDIRECT in the last 72 hours. Thyroid Function Tests: No results for input(s): TSH, T4TOTAL, FREET4, T3FREE, THYROIDAB in the last 72 hours. Anemia Panel: No results for input(s): VITAMINB12, FOLATE, FERRITIN, TIBC, IRON, RETICCTPCT in the last 72 hours. Sepsis Labs: Recent Labs  Lab 09/11/17 1240  PROCALCITON <0.10    Recent Results (from the past 240 hour(s))  Culture, blood (routine x 2)      Status: None (Preliminary result)   Collection Time: 09/11/17 12:32 PM  Result Value Ref Range Status   Specimen Description BLOOD RIGHT HAND  Final   Special Requests   Final    BOTTLES DRAWN AEROBIC ONLY Blood Culture adequate volume   Culture   Final    NO GROWTH 3 DAYS Performed at [facility], [address] [place], [place] [address]    Report Status PENDING  Incomplete  Culture, blood (routine x 2)     Status: None (Preliminary result)   Collection Time: 09/11/17 12:41 PM  Result Value Ref Range Status   Specimen Description BLOOD LEFT ANTECUBITAL  Final   Special Requests   Final    BOTTLES DRAWN AEROBIC ONLY Blood Culture adequate volume   Culture   Final    NO GROWTH 3 DAYS Performed at [facility]  [facility], [address] [place], [place] [address]    Report Status PENDING  Incomplete         Radiology Studies: No results found.      Scheduled Meds: . gabapentin  100 mg Oral BID  . hydrALAZINE  50 mg Oral TID  . pantoprazole  40 mg Oral BID AC   Continuous Infusions: . sodium chloride 75 mL/hr at 09/14/17 0618     LOS: 4 days     Alwyn Ren, MD [facility] If 7PM-7AM, please contact night-coverage [email or web address] Password [facility] 09/14/2017, 11:43 AM

## 2017-09-15 LAB — CBC
HCT: 27.1 % — ABNORMAL LOW (ref 36.0–46.0)
Hemoglobin: 8.3 g/dL — ABNORMAL LOW (ref 12.0–15.0)
MCH: 25.1 pg — AB (ref 26.0–34.0)
MCHC: 30.6 g/dL (ref 30.0–36.0)
MCV: 81.9 fL (ref 78.0–100.0)
PLATELETS: 130 10*3/uL — AB (ref 150–400)
RBC: 3.31 MIL/uL — AB (ref 3.87–5.11)
RDW: 21.2 % — ABNORMAL HIGH (ref 11.5–15.5)
WBC: 2.2 10*3/uL — ABNORMAL LOW (ref 4.0–10.5)

## 2017-09-15 LAB — BASIC METABOLIC PANEL
Anion gap: 7 (ref 5–15)
BUN: 11 mg/dL (ref 8–23)
CHLORIDE: 112 mmol/L — AB (ref 98–111)
CO2: 20 mmol/L — ABNORMAL LOW (ref 22–32)
CREATININE: 0.83 mg/dL (ref 0.44–1.00)
Calcium: 7.4 mg/dL — ABNORMAL LOW (ref 8.9–10.3)
GFR calc Af Amer: >60 mL/min (ref >60)
Glucose, Bld: 89 mg/dL (ref 70–99)
POTASSIUM: 3.3 mmol/L — AB (ref 3.5–5.1)
Sodium: 139 mmol/L (ref 135–145)

## 2017-09-15 MED ORDER — SENNOSIDES-DOCUSATE SODIUM 8.6-50 MG PO TABS: 1.0000 | ORAL_TABLET | Freq: Every evening | ORAL | Status: AC | PRN

## 2017-09-15 MED ORDER — POTASSIUM CHLORIDE 10 MEQ/100ML IV SOLN
10.0000 meq | INTRAVENOUS | Status: AC
  Administered 2017-09-15 (×2): 10 meq via INTRAVENOUS
  Filled 2017-09-15 (×2): qty 100

## 2017-09-15 MED ORDER — PANTOPRAZOLE SODIUM 40 MG PO TBEC: 40.0000 mg | DELAYED_RELEASE_TABLET | Freq: Two times a day (BID) | ORAL | 1 refills | Status: AC

## 2017-09-15 NOTE — Discharge Summary (Signed)
Physician Discharge Summary  Michelle Boyd ZOX:[id number] DOB: 08/05/1950 DOA: 09/10/2017  PCP: Hoy Register, MD  Admit date: 09/10/2017 Discharge date: 09/15/2017  Admitted From:  Disposition:   Recommendations for Outpatient Follow-up:  1. Follow up with PCP in 1-2 weeks 2. Please obtain BMP/CBC in one week 3. Follow up with dr schooler in 2 weeks   Home Health:pt Equipment/Devices none  Discharge Condition:stable CODE STATUS:full Diet recommendation: Bland soft diet Brief/Interim Summary:67 year old female with osteoarthritis, TIA, GERD, pancytopenia, hypertension, recent admission for symptomatic anemia, joint pain secondary to inflammatory versus infectious arthritis with oral Solu-Medrol. During the previous admission, patient was found to have anemia, started on iron and B12 supplements but she could not tolerate iron pills. Patient presented with increasing lower abdominal pain, generalized weakness, mild pica. Also taking moderate amount of NSAIDs for joint pains. Hemoglobin 6.4 at the time of admission    Discharge Diagnoses:  Principal Problem:   Acute anemia Active Problems:   Hypertension   Non-traumatic compression fracture of L3 & L4 lumbar vertebra   Iron (Fe) deficiency anemia   Multiple joint pain   GERD (gastroesophageal reflux disease)   Abdominal pain   Adnexal mass   Elevated creatine kinase Symptomatic anemia/pancytopenia with leukopenia, thrombocytopenia -Unclear etiology, patient had a colonoscopy a week ago which showed few small distal polyps which were removed and subsequently patient had melena which has resolved - EGDlarge gastric ulcer.  Hemoglobin slowly rising up.  Day of discharge her hemoglobin is 8.3.  Continue Protonix twice a day.  Patient will follow-up with Dr. Bosie Clos in 2 to 4 weeks for repeat EGD. -Patient noted noted to have pancytopenia, LDH mildly elevated 367,haptoglobin normal, reticulocyte count normal. -CT abdomen showed  small amount of ascites in the abdomen but no focal abnormality, incidental right adnexal enlarging low-density structure 3.9 cm, may represent adnexal cyst -Patient will follow-up with hematology as an outpatient.   Active Problems: Right adnexal cyst -Incidental finding on CT abdomen,pelvic ultrasound reviewed, consistent with simple appearing right ovarian cyst, follow-up ultrasound in 1 year  Hypertension -Currently stable, continue hydralazine  Hypokalemia -Replaced on the day of discharge.  Acute kidney injury -Creatinine mildly elevated 1.1, normalized to 0.9      Discharge Instructions  Discharge Instructions    Call MD for:  difficulty breathing, headache or visual disturbances   Complete by:  As directed    Call MD for:  extreme fatigue   Complete by:  As directed    Call MD for:  persistant dizziness or light-headedness   Complete by:  As directed    Call MD for:  persistant nausea and vomiting   Complete by:  As directed    Call MD for:  redness, tenderness, or signs of infection (pain, swelling, redness, odor or green/yellow discharge around incision site)   Complete by:  As directed    Call MD for:  severe uncontrolled pain   Complete by:  As directed    Diet - low sodium heart healthy   Complete by:  As directed    Increase activity slowly   Complete by:  As directed      Allergies as of 09/15/2017      Reactions   Feosol [iron] Itching      Medication List    STOP taking these medications   cetirizine 10 MG tablet Commonly known as:  ZYRTEC   lactulose 10 GM/15ML solution Commonly known as:  CHRONULAC   losartan-hydrochlorothiazide 100-25 MG tablet Commonly known as:  HYZAAR   methylPREDNISolone 4 MG tablet Commonly known as:  MEDROL   naproxen 500 MG tablet Commonly known as:  NAPROSYN   traMADol 50 MG tablet Commonly known as:  ULTRAM     TAKE these medications   ferrous sulfate 325 (65 FE) MG tablet Take 1 tablet (325 mg  total) by mouth 2 (two) times daily with a meal.   gabapentin 100 MG capsule Commonly known as:  NEURONTIN Take 1 capsule (100 mg total) by mouth 2 (two) times daily.   hydrALAZINE 50 MG tablet Commonly known as:  APRESOLINE Take 1 tablet (50 mg total) by mouth 3 (three) times daily. What changed:  Another medication with the same name was removed. Continue taking this medication, and follow the directions you see here.   pantoprazole 40 MG tablet Commonly known as:  PROTONIX Take 1 tablet (40 mg total) by mouth 2 (two) times daily before a meal.   senna-docusate 8.6-50 MG tablet Commonly known as:  Senokot-S Take 1 tablet by mouth at bedtime as needed for mild constipation.      Follow-up Information    Hoy RegisterNewlin, Enobong, MD Follow up.   Specialty:  Family Medicine Contact information: [address] [place] [address] [phone]        Charlott RakesSchooler, Vincent, MD Follow up.   Specialty:  Gastroenterology Why:  follow up in 2 weeks.call for appointment Contact information: [address] [place] [address] [phone]          Allergies  Allergen Reactions  . Feosol [Iron] Itching    Consultations:  Gi dr edwards   Procedures/Studies: Ct Abdomen Pelvis W Contrast  Result Date: 09/10/2017 CLINICAL DATA:  67 year old with abdominal pain.  Bloody stool. EXAM: CT ABDOMEN AND PELVIS WITH CONTRAST TECHNIQUE: Multidetector CT imaging of the abdomen and pelvis was performed using the standard protocol following bolus administration of intravenous contrast. CONTRAST:  100mL OMNIPAQUE IOHEXOL 300 MG/ML  SOLN COMPARISON:  10/22/2013 and 10/02/2013 FINDINGS: Lower chest: There are several small nodular densities at the lung bases. However, these appear to be stable from the exam on 10/22/2013. There is volume loss in the left lower lobe. No large pleural effusions. Hepatobiliary: Decreased attenuation of the liver and this could be related to  steatosis. Small amount of perihepatic fluid along the inferior aspect of the liver. Main portal venous system is patent. Gallbladder appears to be surgically absent. Pancreas: Unremarkable. No pancreatic ductal dilatation or surrounding inflammatory changes. Spleen: Normal in size without focal abnormality. Adrenals/Urinary Tract: Normal adrenal glands. Urinary bladder is unremarkable. Negative for hydronephrosis. Normal appearance of both kidneys. Probable cyst in the anterior mid left kidney. Stomach/Bowel: Normal appearance of the stomach and duodenum. The rectum and colon are decompressed and difficult to evaluate for wall thickening. There is no significant bowel dilatation. No focal bowel inflammation. Vascular/Lymphatic: Atherosclerotic disease in the abdominal aorta without aneurysm. The main visceral arteries are patent. No significant lymph node enlargement in the abdomen or pelvis. Reproductive: There is a 3.9 x 3.1 cm round low-density structure associated with the right adnexa. Previously, there was a 2.3 cm low-density structure in this area. No gross abnormality to the left adnexa. Normal appearance of the uterus. Other: Trace free fluid in the pelvis.  Negative for free air. Musculoskeletal: No acute bone abnormality. IMPRESSION: Small amount of ascites in the abdomen and pelvis but no focal inflammation within the abdomen or pelvis. **An incidental finding of potential clinical significance has been found.  Enlarging low-density structure that appears to be associated with the right adnexa. This structure measures up to 3.9 cm and may represent an adnexal cyst. Recommend follow-up pelvic ultrasound to further characterize the structure.** Electronically Signed   By: Richarda Overlie M.D.   On: 09/10/2017 18:51   Dg Shoulder Left  Result Date: 09/10/2017 CLINICAL DATA:  Left shoulder pain, no injury EXAM: LEFT SHOULDER - 2+ VIEW COMPARISON:  None. FINDINGS: There is no evidence of fracture or  dislocation. There is no evidence of arthropathy or other focal bone abnormality. Soft tissues are unremarkable. IMPRESSION: Negative. Electronically Signed   By: Marlan Palau M.D.   On: 09/10/2017 16:40   US Pelvic Complete With Transvaginal  Result Date: 09/11/2017 CLINICAL DATA:  Follow-up of right adnexal mass found on CT scan of July 23rd 2019 EXAM: TRANSABDOMINAL AND TRANSVAGINAL ULTRASOUND OF PELVIS TECHNIQUE: Both transabdominal and transvaginal ultrasound examinations of the pelvis were performed. Transabdominal technique was performed for global imaging of the pelvis including uterus, ovaries, adnexal regions, and pelvic cul-de-sac. It was necessary to proceed with endovaginal exam following the transabdominal exam to visualize the uterus, endometrium, ovaries, and adnexal structures. COMPARISON:  Abdominal and pelvic CT scan of September 10, 2017 FINDINGS: Uterus Measurements: 3.6 x 1.7 x 2.2 cm. No fibroids or other mass visualized. Endometrium Thickness: 3 mm.  No focal abnormality visualized. Right ovary Measurements: 4.3 x 3.2 x 4.3 cm. There is a 3.1 x 2.9 x 3.5 cm since simple appearing right ovarian cyst. There is no surrounding hypervascularity. Left ovary Measurements: The left ovary could not be visualized. Normal appearance/no adnexal mass. Other findings There is a small amount of free pelvic fluid. IMPRESSION: Simple appearing right ovarian cyst measuring 3.1 x 2.9 x 3.5 cm. No suspicious solid appearing adnexal mass on the right is observed. Follow-up ultrasound in 1 year is recommended. Nonvisualization of the left ovary. Normal appearance of the uterus and endometrium. Electronically Signed   By: David  [place] M.D.   On: 09/11/2017 11:26    (Echo, Carotid, EGD, Colonoscopy, ERCP)    Subjective:   Discharge Exam: Vitals:   09/15/17 0443 09/15/17 0933  BP: 135/83 (!) 146/74  Pulse: 99 (!) 108  Resp: 18 18  Temp: 98.4 F (36.9 C) 98.8 F (37.1 C)  SpO2: 98% 97%   Vitals:    09/14/17 2025 09/15/17 0014 09/15/17 0443 09/15/17 0933  BP: (!) 141/71  135/83 (!) 146/74  Pulse: (!) 115  99 (!) 108  Resp: 16  18 18   Temp: (!) 100.6 F (38.1 C) 98.5 F (36.9 C) 98.4 F (36.9 C) 98.8 F (37.1 C)  TempSrc: Oral Oral Oral Oral  SpO2: 96%  98% 97%  Weight: 58.6 kg (129 lb 3 oz)       General: Pt is alert, awake, not in acute distress Cardiovascular: RRR, S1/S2 +, no rubs, no gallops Respiratory: CTA bilaterally, no wheezing, no rhonchi Abdominal: Soft, NT, ND, bowel sounds + Extremities: no edema, no cyanosis    The results of significant diagnostics from this hospitalization (including imaging, microbiology, ancillary and laboratory) are listed below for reference.     Microbiology: Recent Results (from the past 240 hour(s))  Culture, blood (routine x 2)     Status: None (Preliminary result)   Collection Time: 09/11/17 12:32 PM  Result Value Ref Range Status   Specimen Description BLOOD RIGHT HAND  Final   Special Requests   Final    BOTTLES DRAWN AEROBIC ONLY Blood Culture  adequate volume   Culture   Final    NO GROWTH 3 DAYS Performed at [facility], [address] [place], [place] [address]    Report Status PENDING  Incomplete  Culture, blood (routine x 2)     Status: None (Preliminary result)   Collection Time: 09/11/17 12:41 PM  Result Value Ref Range Status   Specimen Description BLOOD LEFT ANTECUBITAL  Final   Special Requests   Final    BOTTLES DRAWN AEROBIC ONLY Blood Culture adequate volume   Culture   Final    NO GROWTH 3 DAYS Performed at [facility], [address] [place], [place] [address]    Report Status PENDING  Incomplete     Labs: BNP (last 3 results) Recent Labs    02/10/17 1030 04/21/17 0202  BNP 23.6 20.9   Basic Metabolic Panel: Recent Labs  Lab 09/10/17 1228 09/10/17 1846 09/11/17 0055 09/12/17 0733 09/13/17 0607 09/14/17 0611  NA 135  --  134* 137 138 138  K 3.1*  --  3.1* 3.7 3.2*  3.1*  CL 103  --  106 112* 111 112*  CO2 24  --  23 21* 22 20*  GLUCOSE 121*  --  146* 81 97 86  BUN 11  --  11 7* 12 13  CREATININE 1.14*  --  1.02* 0.90 0.88 0.88  CALCIUM 7.7*  --  7.1* 7.0* 7.0* 7.2*  MG  --  1.8  --   --   --   --    Liver Function Tests: Recent Labs  Lab 09/10/17 1228 09/11/17 0055  AST 43* 34  ALT 14 12  ALKPHOS 79 71  BILITOT 0.8 1.0  PROT 7.0 6.1*  ALBUMIN 2.1* 1.8*   Recent Labs  Lab 09/10/17 1228  LIPASE 40   No results for input(s): AMMONIA in the last 168 hours. CBC: Recent Labs  Lab 09/11/17 0643  09/12/17 0733 09/12/17 1515 09/12/17 2133 09/14/17 0611 09/15/17 0721  WBC 2.8*   < > 2.8* 2.4* 2.8* 2.3* 2.2*  NEUTROABS 2.0  --   --   --   --  1.6*  --   HGB 9.7*   < > 9.3* 9.4* 9.3* 8.1* 8.3*  HCT 30.2*   < > 29.4* 30.5* 29.3* 25.8* 27.1*  MCV 76.8*   < > 78.6 79.2 78.1 78.4 81.9  PLT 123*   < > 127* 114* 126* 129* 130*   < > = values in this interval not displayed.   Cardiac Enzymes: No results for input(s): CKTOTAL, CKMB, CKMBINDEX, TROPONINI in the last 168 hours. BNP: Invalid input(s): POCBNP CBG: No results for input(s): GLUCAP in the last 168 hours. D-Dimer No results for input(s): DDIMER in the last 72 hours. Hgb A1c No results for input(s): HGBA1C in the last 72 hours. Lipid Profile No results for input(s): CHOL, HDL, LDLCALC, TRIG, CHOLHDL, LDLDIRECT in the last 72 hours. Thyroid function studies No results for input(s): TSH, T4TOTAL, T3FREE, THYROIDAB in the last 72 hours.  Invalid input(s): FREET3 Anemia work up No results for input(s): VITAMINB12, FOLATE, FERRITIN, TIBC, IRON, RETICCTPCT in the last 72 hours. Urinalysis    Component Value Date/Time   COLORURINE YELLOW 09/10/2017 1228   APPEARANCEUR HAZY (A) 09/10/2017 1228   LABSPEC 1.013 09/10/2017 1228   PHURINE 6.0 09/10/2017 1228   GLUCOSEU NEGATIVE 09/10/2017 1228   HGBUR LARGE (A) 09/10/2017 1228   BILIRUBINUR NEGATIVE 09/10/2017 1228   KETONESUR  NEGATIVE  09/10/2017 1228   PROTEINUR NEGATIVE 09/10/2017 1228   UROBILINOGEN 1.0 10/22/2013 1100   NITRITE NEGATIVE 09/10/2017 1228   LEUKOCYTESUR NEGATIVE 09/10/2017 1228   Sepsis Labs Invalid input(s): PROCALCITONIN,  WBC,  LACTICIDVEN Microbiology Recent Results (from the past 240 hour(s))  Culture, blood (routine x 2)     Status: None (Preliminary result)   Collection Time: 09/11/17 12:32 PM  Result Value Ref Range Status   Specimen Description BLOOD RIGHT HAND  Final   Special Requests   Final    BOTTLES DRAWN AEROBIC ONLY Blood Culture adequate volume   Culture   Final    NO GROWTH 3 DAYS Performed at [facility], [address] [place], [place] [address]    Report Status PENDING  Incomplete  Culture, blood (routine x 2)     Status: None (Preliminary result)   Collection Time: 09/11/17 12:41 PM  Result Value Ref Range Status   Specimen Description BLOOD LEFT ANTECUBITAL  Final   Special Requests   Final    BOTTLES DRAWN AEROBIC ONLY Blood Culture adequate volume   Culture   Final    NO GROWTH 3 DAYS Performed at [facility], [address] [place], [place] [address]    Report Status PENDING  Incomplete     Time coordinating discharge: 34 minutes  SIGNED:   Alwyn Ren, MD  [facility] 09/15/2017, 11:54 AM Pager   If 7PM-7AM, please contact night-coverage [email or web address] Password [email or web address]

## 2017-09-15 NOTE — Progress Notes (Signed)
[facility] PROGRESS NOTE Subjective No further gross bleeding  Objective: Vital signs in last 24 hours: Temp:  [98.2 F (36.8 C)-100.6 F (38.1 C)] 98.8 F (37.1 C) (07/28 0933) Pulse Rate:  [99-115] 108 (07/28 0933) Resp:  [16-18] 18 (07/28 0933) BP: (135-156)/(71-88) 146/74 (07/28 0933) SpO2:  [96 %-98 %] 97 % (07/28 0933) Weight:  [58.6 kg (129 lb 3 oz)] 58.6 kg (129 lb 3 oz) (07/27 2025) Last BM Date: 09/14/17  Intake/Output from previous day: 07/27 0701 - 07/28 0700 In: 1800 [P.O.:900; I.V.:900] Out: 0  Intake/Output this shift: Total I/O In: 480 [P.O.:480] Out: 0     Lab Results: Recent Labs    09/12/17 1515 09/12/17 2133 09/14/17 0611 09/15/17 0721  WBC 2.4* 2.8* 2.3* 2.2*  HGB 9.4* 9.3* 8.1* 8.3*  HCT 30.5* 29.3* 25.8* 27.1*  PLT 114* 126* 129* 130*   BMET Recent Labs    09/13/17 0607 09/14/17 0611  NA 138 138  K 3.2* 3.1*  CL 111 112*  CO2 22 20*  CREATININE 0.88 0.88   LFT No results for input(s): PROT, AST, ALT, ALKPHOS, BILITOT, BILIDIR, IBILI in the last 72 hours. PT/INR No results for input(s): LABPROT, INR in the last 72 hours. PANCREAS No results for input(s): LIPASE in the last 72 hours.       Studies/Results: No results found.  Medications: I have reviewed the patient's current medications.  Assessment:   1.  Bleeding gastric ulcer.  Seems to be doing okay hemoglobin slowly rising   Plan: We will increase to soft diet.  Should be able to go home and follow-up with Dr. Bosie ClosSchooler.  She will need repeat EGD in 2 months to document healing.. Would discharge her on PPI twice daily.  Please call back for any further problems   Tresea MallJames L Eilan Mcinerny 09/15/2017, 9:41 AM  This note was created using voice recognition software. Minor errors may Have occurred unintentionally.  Pager: [phone] If no answer or after hours call [phone]

## 2017-09-15 NOTE — Care Management Note (Signed)
Case Management Note  Patient Details  Name: Michelle Boyd MRN: [id number] Date of Birth: 10/16/1950  Subjective/Objective:   Pt presented from home with family for anemia.  [facility].  No preference of agency and [facility]        Action/Plan: [facility]  Orders requested.  Expected Discharge Date:  09/15/17               Expected Discharge Plan:  Home w Home Health Services  In-House Referral:  NA  Discharge planning Services  CM Consult  Post Acute Care Choice:  Home Health Choice offered to:  Adult Children  DME Arranged:  N/A DME Agency:  NA  HH Arranged:  PT HH Agency:  [facility]  Status of Service:  Completed, signed off  If discussed at Long Length of Stay Meetings, dates discussed:    Additional Comments:  Deveron Furlongshley  Jarmarcus Wambold, RN 09/15/2017, 12:49 PM

## 2017-09-16 LAB — CULTURE, BLOOD (ROUTINE X 2)
CULTURE: NO GROWTH
Culture: NO GROWTH
SPECIAL REQUESTS: ADEQUATE
Special Requests: ADEQUATE

## 2017-09-19 ENCOUNTER — Encounter: Payer: Self-pay | Admitting: Family Medicine

## 2017-09-19 ENCOUNTER — Ambulatory Visit: Payer: Medicaid Other | Attending: Family Medicine | Admitting: Family Medicine

## 2017-09-19 VITALS — BP 112/61 | HR 94 | Temp 98.1°F | Ht 59.0 in | Wt 118.2 lb

## 2017-09-19 DIAGNOSIS — M25562 Pain in left knee: Secondary | ICD-10-CM | POA: Diagnosis not present

## 2017-09-19 DIAGNOSIS — K279 Peptic ulcer, site unspecified, unspecified as acute or chronic, without hemorrhage or perforation: Secondary | ICD-10-CM | POA: Insufficient documentation

## 2017-09-19 DIAGNOSIS — I10 Essential (primary) hypertension: Secondary | ICD-10-CM | POA: Insufficient documentation

## 2017-09-19 DIAGNOSIS — D509 Iron deficiency anemia, unspecified: Secondary | ICD-10-CM | POA: Diagnosis not present

## 2017-09-19 DIAGNOSIS — Z79899 Other long term (current) drug therapy: Secondary | ICD-10-CM | POA: Diagnosis not present

## 2017-09-19 DIAGNOSIS — K219 Gastro-esophageal reflux disease without esophagitis: Secondary | ICD-10-CM | POA: Insufficient documentation

## 2017-09-19 DIAGNOSIS — R0789 Other chest pain: Secondary | ICD-10-CM

## 2017-09-19 DIAGNOSIS — M25561 Pain in right knee: Secondary | ICD-10-CM

## 2017-09-19 DIAGNOSIS — E876 Hypokalemia: Secondary | ICD-10-CM | POA: Insufficient documentation

## 2017-09-19 MED ORDER — TRAMADOL HCL 50 MG PO TABS: 50.0000 mg | ORAL_TABLET | Freq: Two times a day (BID) | ORAL | 0 refills | Status: AC | PRN

## 2017-09-19 MED ORDER — POTASSIUM CHLORIDE ER 10 MEQ PO TBCR: 10.0000 meq | EXTENDED_RELEASE_TABLET | Freq: Every day | ORAL | 3 refills | Status: AC

## 2017-09-19 NOTE — Progress Notes (Signed)
Subjective:  Patient ID: Michelle Boyd, female    DOB: Jul 18, 1950  Age: 67 y.o. MRN: [id number]  CC: Hospitalization Follow-up   HPI Nayeliz Imm s a 67 year old female with a history of Hypertension, GERD, TIA osteoarthritis of the knees who presents today accompanied by her son for follow-up from hospitalization at [facility] from 09/10/2017 through 09/15/2017 for symptomatic anemia after previous hospitalization for same and was unable to tolerate iron supplements. She was admitted with a hemoglobin of 6.4, she received PRBC.  EGD revealed oozing gastric ulcer, gastritis and pathology was negative for malignancy.  Potassium was repleted and she was treated with IV PPI.  CT abdomen revealed: IMPRESSION: Small amount of ascites in the abdomen and pelvis but no focal inflammation within the abdomen or pelvis.  **An incidental finding of potential clinical significance has been found. Enlarging low-density structure that appears to be associated with the right adnexa. This structure measures up to 3.9 cm and may represent an adnexal cyst. Recommend follow-up pelvic ultrasound to further characterize the structure.**  She presents today accompanied by her son and complains of epigastric pain radiating to her anterior chest wall and pain in her knees and ankles.  She denies nausea, vomiting and has been compliant with her medications.  She denies dizziness; discharge hemoglobin was 8.3 She will be relocating with her family to [place] in 3 weeks.  Past Medical History:  Diagnosis Date  . Arthritis   . Cholelithiasis   . Chronic knee pain   . Gallstone pancreatitis   . GERD (gastroesophageal reflux disease)   . Hypertension   . Memory loss     Past Surgical History:  Procedure Laterality Date  . BIOPSY  09/12/2017   Procedure: BIOPSY;  Surgeon: Wilford Corner, MD;  Location: [facility];  Service: Endoscopy;;  . CATARACT EXTRACTION  2016  . CHOLECYSTECTOMY N/A  10/04/2013   Procedure: LAPAROSCOPIC CHOLECYSTECTOMY WITH INTRAOPERATIVE CHOLANGIOGRAM;  Surgeon: Ralene Ok, MD;  Location: [place];  Service: General;  Laterality: N/A;  . ESOPHAGOGASTRODUODENOSCOPY (EGD) WITH PROPOFOL Left 09/12/2017   Procedure: ESOPHAGOGASTRODUODENOSCOPY (EGD) WITH PROPOFOL;  Surgeon: Wilford Corner, MD;  Location: [place];  Service: Endoscopy;  Laterality: Left;  . EXTERNAL EAR SURGERY  years ago   right mastoidectomy    Allergies  Allergen Reactions  . Feosol [Iron] Itching     Outpatient Medications Prior to Visit  Medication Sig Dispense Refill  . gabapentin (NEURONTIN) 100 MG capsule Take 1 capsule (100 mg total) by mouth 2 (two) times daily. 60 capsule 3  . hydrALAZINE (APRESOLINE) 50 MG tablet Take 1 tablet (50 mg total) by mouth 3 (three) times daily. 90 tablet 5  . pantoprazole (PROTONIX) 40 MG tablet Take 1 tablet (40 mg total) by mouth 2 (two) times daily before a meal. 60 tablet 1  . senna-docusate (SENOKOT-S) 8.6-50 MG tablet Take 1 tablet by mouth at bedtime as needed for mild constipation.    . ferrous sulfate 325 (65 FE) MG tablet Take 1 tablet (325 mg total) by mouth 2 (two) times daily with a meal. 60 tablet 0   No facility-administered medications prior to visit.     ROS Review of Systems  Constitutional: Negative for activity change, appetite change and fatigue.  HENT: Negative for congestion, sinus pressure and sore throat.   Eyes: Negative for visual disturbance.  Respiratory: Negative for cough, chest tightness, shortness of breath and wheezing.   Cardiovascular: Positive for chest pain (entire anterior chest wall). Negative  for palpitations.  Gastrointestinal: Negative for abdominal distention, abdominal pain and constipation.  Endocrine: Negative for polydipsia.  Genitourinary: Negative for dysuria and frequency.  Musculoskeletal: Positive for arthralgias. Negative for back pain.  Skin: Negative for rash.  Neurological:  Negative for tremors, light-headedness and numbness.  Hematological: Does not bruise/bleed easily.  Psychiatric/Behavioral: Negative for agitation and behavioral problems.    Objective:  BP 112/61   Pulse 94   Temp 98.1 F (36.7 C) (Oral)   Ht _0  (1.499 m)   Wt 118 lb 3.2 oz (53.6 kg)   SpO2 97%   BMI 23.87 kg/m   BP/Weight 09/19/2017 09/15/2017 0/62/3762  Systolic BP 831 517 -  Diastolic BP 61 74 -  Wt. (Lbs) 118.2 - 129.19  BMI 23.87 - 26.09      Physical Exam  Constitutional: She is oriented to person, place, and time. She appears well-developed and well-nourished.  Cardiovascular: Normal rate, normal heart sounds and intact distal pulses.  No murmur heard. Pulmonary/Chest: Effort normal and breath sounds normal. She has no wheezes. She has no rales. She exhibits tenderness.  Abdominal: Soft. Bowel sounds are normal. She exhibits no distension and no mass. There is tenderness (epigastric).  Musculoskeletal: Normal range of motion.  Neurological: She is alert and oriented to person, place, and time.  Skin: Skin is warm and dry.  Psychiatric: She has a normal mood and affect.    CBC    Component Value Date/Time   WBC 2.2 (L) 09/15/2017 0721   RBC 3.31 (L) 09/15/2017 0721   HGB 8.3 (L) 09/15/2017 0721   HGB 8.5 (L) 05/15/2017 0916   HCT 27.1 (L) 09/15/2017 0721   HCT 28.0 (L) 05/15/2017 0916   PLT 130 (L) 09/15/2017 0721   PLT 292 05/15/2017 0916   MCV 81.9 09/15/2017 0721   MCV 73 (L) 05/15/2017 0916   MCH 25.1 (L) 09/15/2017 0721   MCHC 30.6 09/15/2017 0721   RDW 21.2 (H) 09/15/2017 0721   RDW 17.2 (H) 05/15/2017 0916   LYMPHSABS 0.6 (L) 09/14/2017 0611   LYMPHSABS 0.7 05/15/2017 0916   MONOABS 0.1 09/14/2017 0611   EOSABS 0.0 09/14/2017 0611   EOSABS 0.2 05/15/2017 0916   BASOSABS 0.0 09/14/2017 0611   BASOSABS 0.0 05/15/2017 0916    CMP Latest Ref Rng & Units 09/15/2017 09/14/2017 09/13/2017  Glucose 70 - 99 mg/dL 89 86 97  BUN 8 - 23 mg/dL _1 Creatinine 0.44 - 1.00 mg/dL 0.83 0.88 0.88  Sodium 135 - 145 mmol/L 139 138 138  Potassium 3.5 - 5.1 mmol/L 3.3(L) 3.1(L) 3.2(L)  Chloride 98 - 111 mmol/L 112(H) 112(H) 111  CO2 22 - 32 mmol/L 20(L) 20(L) 22  Calcium 8.9 - 10.3 mg/dL 7.4(L) 7.2(L) 7.0(L)  Total Protein 6.5 - 8.1 g/dL - - -  Total Bilirubin 0.3 - 1.2 mg/dL - - -  Alkaline Phos 38 - 126 U/L - - -  AST 15 - 41 U/L - - -  ALT 0 - 44 U/L - - -     Assessment & Plan:   1. Microcytic anemia Increase intake of iron rich foods We will send of CBC in 2 weeks to follow-up on anemia Needs also follow-up regarding adnexal mass-she will establish with a new PCP in [place] - CBC with Differential/Platelet; Future  2. Peptic ulcer Avoid NSAIDs Would love to refer to GI however she will be relocating to [place] and has been advised to establish care soon as  she gets there - CMP14+EGFR; Future  3. Arthralgia of both knees Unable to tolerate NSAIDs Placed on tramadol Status post cortisone injections in her knees earlier this year - traMADol (ULTRAM) 50 MG tablet; Take 1 tablet (50 mg total) by mouth every 12 (twelve) hours as needed.  Dispense: 60 tablet; Refill: 0  4. Chest wall pain Recent EKG from hospitalization reveals incomplete left bundle branch block  We will need to see cardiology but given relocation recommended to establish care as soon as she gets to [place] - traMADol (ULTRAM) 50 MG tablet; Take 1 tablet (50 mg total) by mouth every 12 (twelve) hours as needed.  Dispense: 60 tablet; Refill: 0  5. Hypokalemia - potassium chloride (KLOR-CON 10) 10 MEQ tablet; Take 1 tablet (10 mEq total) by mouth daily.  Dispense: 30 tablet; Refill: 3   Meds ordered this encounter  Medications  . potassium chloride (KLOR-CON 10) 10 MEQ tablet    Sig: Take 1 tablet (10 mEq total) by mouth daily.    Dispense:  30 tablet    Refill:  3  . traMADol (ULTRAM) 50 MG tablet    Sig: Take 1 tablet (50 mg total) by  mouth every 12 (twelve) hours as needed.    Dispense:  60 tablet    Refill:  0    Follow-up: Return in about 3 weeks (around 10/10/2017) for follow up with new PCP in [place].   Charlott Rakes MD

## 2017-09-19 NOTE — Patient Instructions (Signed)
Peptic Ulcer °A peptic ulcer is a painful sore in the lining of your esophagus, stomach, or the first part of your small intestine. You may have pain in the area between your chest and your belly button. The most common causes of an ulcer are: °· An infection. °· Using certain pain medicines too often or too much. ° °Follow these instructions at home: °· Avoid alcohol. °· Avoid caffeine. °· Do not use any tobacco products. These include cigarettes, chewing tobacco, and e-cigarettes. If you need help quitting, ask your doctor. °· Take over-the-counter and prescription medicines only as told by your doctor. Do not stop or change your medicines unless you talk with your doctor about it first. °· Keep all follow-up visits as told by your doctor. This is important. °Contact a doctor if: °· You do not get better in 7 days after you start treatment. °· You keep having an upset stomach (indigestion) or heartburn. °Get help right away if: °· You have sudden, sharp pain in your belly (abdomen). °· You have lasting belly pain. °· You have bloody poop (stool) or black, tarry poop. °· You throw up (vomit) blood. It may look like coffee grounds. °· You feel light-headed or feel like you may pass out (faint). °· You get weak. °· You get sweaty or feel sticky and cold to the touch (clammy). °This information is not intended to replace advice given to you by your health care provider. Make sure you discuss any questions you have with your health care provider. °Document Released: 05/02/2009 Document Revised: 06/22/2015 Document Reviewed: 11/06/2014 °Elsevier Interactive Patient Education © 2018 [employer] ° °

## 2017-10-03 ENCOUNTER — Ambulatory Visit: Payer: Medicaid Other | Attending: Family Medicine

## 2017-10-03 DIAGNOSIS — K279 Peptic ulcer, site unspecified, unspecified as acute or chronic, without hemorrhage or perforation: Secondary | ICD-10-CM | POA: Diagnosis not present

## 2017-10-03 DIAGNOSIS — D508 Other iron deficiency anemias: Secondary | ICD-10-CM | POA: Insufficient documentation

## 2017-10-03 DIAGNOSIS — D509 Iron deficiency anemia, unspecified: Secondary | ICD-10-CM

## 2017-10-03 NOTE — Progress Notes (Signed)
Patient here for lab visit only 

## 2017-10-04 ENCOUNTER — Telehealth: Payer: Self-pay

## 2017-10-04 LAB — CBC WITH DIFFERENTIAL/PLATELET
BASOS ABS: 0 10*3/uL (ref 0.0–0.2)
Basos: 0 %
EOS (ABSOLUTE): 0.1 10*3/uL (ref 0.0–0.4)
Eos: 2 %
HEMOGLOBIN: 7 g/dL — AB (ref 11.1–15.9)
Hematocrit: 22.5 % — ABNORMAL LOW (ref 34.0–46.6)
Immature Grans (Abs): 0 10*3/uL (ref 0.0–0.1)
Immature Granulocytes: 0 %
LYMPHS ABS: 0.6 10*3/uL — AB (ref 0.7–3.1)
Lymphs: 15 %
MCH: 24.8 pg — ABNORMAL LOW (ref 26.6–33.0)
MCHC: 31.1 g/dL — AB (ref 31.5–35.7)
MCV: 80 fL (ref 79–97)
MONOCYTES: 4 %
Monocytes Absolute: 0.2 10*3/uL (ref 0.1–0.9)
Neutrophils Absolute: 2.8 10*3/uL (ref 1.4–7.0)
Neutrophils: 79 %
PLATELETS: 208 10*3/uL (ref 150–450)
RBC: 2.82 x10E6/uL — AB (ref 3.77–5.28)
RDW: 19.7 % — AB (ref 12.3–15.4)
WBC: 3.6 10*3/uL (ref 3.4–10.8)

## 2017-10-04 LAB — IRON,TIBC AND FERRITIN PANEL
FERRITIN: 233 ng/mL — AB (ref 15–150)
Iron Saturation: 10 % — ABNORMAL LOW (ref 15–55)
Iron: 17 ug/dL — ABNORMAL LOW (ref 27–139)
TIBC: 167 ug/dL — AB (ref 250–450)
UIBC: 150 ug/dL (ref 118–369)

## 2017-10-04 LAB — CMP14+EGFR
ALK PHOS: 104 IU/L (ref 39–117)
ALT: 13 IU/L (ref 0–32)
AST: 39 IU/L (ref 0–40)
Albumin/Globulin Ratio: 0.6 — ABNORMAL LOW (ref 1.2–2.2)
Albumin: 2.5 g/dL — ABNORMAL LOW (ref 3.6–4.8)
BUN/Creatinine Ratio: 12 (ref 12–28)
BUN: 20 mg/dL (ref 8–27)
Bilirubin Total: 0.4 mg/dL (ref 0.0–1.2)
CO2: 22 mmol/L (ref 20–29)
CREATININE: 1.61 mg/dL — AB (ref 0.57–1.00)
Calcium: 7.8 mg/dL — ABNORMAL LOW (ref 8.7–10.3)
Chloride: 98 mmol/L (ref 96–106)
GFR calc Af Amer: 37 mL/min/{1.73_m2} — ABNORMAL LOW (ref >59)
GFR calc non Af Amer: 32 mL/min/{1.73_m2} — ABNORMAL LOW (ref >59)
GLUCOSE: 82 mg/dL (ref 65–99)
Globulin, Total: 4.2 g/dL (ref 1.5–4.5)
Potassium: 3.2 mmol/L — ABNORMAL LOW (ref 3.5–5.2)
Sodium: 135 mmol/L (ref 134–144)
Total Protein: 6.7 g/dL (ref 6.0–8.5)

## 2017-10-04 NOTE — Telephone Encounter (Signed)
Patient was called and informed of lab results. 

## 2017-10-04 NOTE — Telephone Encounter (Signed)
-----   Message from Hoy RegisterEnobong Newlin, MD sent at 10/04/2017  9:32 AM EDT ----- Labs reveal severe anemia; she will need to present to the ED for a blood transfusion. She said she would be relocating to [place] and would need to go to the ED still even if she has.

## 2017-10-14 ENCOUNTER — Other Ambulatory Visit: Payer: Self-pay | Admitting: Family Medicine

## 2017-10-14 DIAGNOSIS — I10 Essential (primary) hypertension: Secondary | ICD-10-CM

## 2017-10-22 ENCOUNTER — Other Ambulatory Visit (INDEPENDENT_AMBULATORY_CARE_PROVIDER_SITE_OTHER): Payer: Self-pay | Admitting: Orthopaedic Surgery

## 2017-10-22 DIAGNOSIS — M1711 Unilateral primary osteoarthritis, right knee: Secondary | ICD-10-CM

## 2017-10-26 ENCOUNTER — Other Ambulatory Visit (INDEPENDENT_AMBULATORY_CARE_PROVIDER_SITE_OTHER): Payer: Self-pay | Admitting: Orthopaedic Surgery

## 2017-10-28 NOTE — Telephone Encounter (Signed)
Please advise
